# Patient Record
Sex: Female | Born: 1955 | Race: White | Hispanic: No | Marital: Married | Smoking: Former smoker
Health system: Southern US, Community
[De-identification: ages and names within clinical notes are randomized; demographics above are authoritative.]

## PROBLEM LIST (undated history)

## (undated) DIAGNOSIS — M1990 Unspecified osteoarthritis, unspecified site: Secondary | ICD-10-CM

## (undated) DIAGNOSIS — L209 Atopic dermatitis, unspecified: Secondary | ICD-10-CM

## (undated) DIAGNOSIS — U071 COVID-19: Secondary | ICD-10-CM

## (undated) DIAGNOSIS — E785 Hyperlipidemia, unspecified: Secondary | ICD-10-CM

## (undated) DIAGNOSIS — I10 Essential (primary) hypertension: Secondary | ICD-10-CM

## (undated) DIAGNOSIS — F32A Depression, unspecified: Secondary | ICD-10-CM

## (undated) DIAGNOSIS — F419 Anxiety disorder, unspecified: Secondary | ICD-10-CM

## (undated) DIAGNOSIS — E079 Disorder of thyroid, unspecified: Secondary | ICD-10-CM

## (undated) DIAGNOSIS — Z720 Tobacco use: Secondary | ICD-10-CM

## (undated) DIAGNOSIS — F329 Major depressive disorder, single episode, unspecified: Secondary | ICD-10-CM

## (undated) HISTORY — DX: Unspecified osteoarthritis, unspecified site: M19.90

## (undated) HISTORY — DX: Depression, unspecified: F32.A

## (undated) HISTORY — DX: Atopic dermatitis, unspecified: L20.9

## (undated) HISTORY — DX: Tobacco use: Z72.0

## (undated) HISTORY — DX: Essential (primary) hypertension: I10

## (undated) HISTORY — PX: LUMBAR DISC SURGERY: SHX700

## (undated) HISTORY — PX: ROTATOR CUFF REPAIR: SHX139

## (undated) HISTORY — DX: Hyperlipidemia, unspecified: E78.5

## (undated) HISTORY — DX: Major depressive disorder, single episode, unspecified: F32.9

## (undated) HISTORY — DX: Disorder of thyroid, unspecified: E07.9

## (undated) HISTORY — DX: COVID-19: U07.1

## (undated) HISTORY — DX: Anxiety disorder, unspecified: F41.9

---

## 2002-12-18 LAB — HM COLONOSCOPY: HM Colonoscopy: NORMAL

## 2003-11-12 ENCOUNTER — Ambulatory Visit: Payer: Self-pay

## 2005-03-15 ENCOUNTER — Ambulatory Visit: Payer: Self-pay

## 2005-03-28 ENCOUNTER — Ambulatory Visit: Payer: Self-pay

## 2005-04-20 ENCOUNTER — Ambulatory Visit: Payer: Self-pay | Admitting: Otolaryngology

## 2005-09-08 ENCOUNTER — Ambulatory Visit: Payer: Self-pay | Admitting: Internal Medicine

## 2007-01-05 ENCOUNTER — Ambulatory Visit: Payer: Self-pay

## 2007-08-22 ENCOUNTER — Ambulatory Visit: Payer: Self-pay | Admitting: Unknown Physician Specialty

## 2007-10-22 ENCOUNTER — Emergency Department: Payer: Self-pay | Admitting: Emergency Medicine

## 2007-12-17 ENCOUNTER — Ambulatory Visit: Payer: Self-pay | Admitting: Unknown Physician Specialty

## 2008-03-18 ENCOUNTER — Ambulatory Visit: Payer: Self-pay | Admitting: Internal Medicine

## 2008-03-19 ENCOUNTER — Ambulatory Visit: Payer: Self-pay

## 2008-04-07 ENCOUNTER — Ambulatory Visit: Payer: Self-pay | Admitting: Internal Medicine

## 2008-05-09 ENCOUNTER — Emergency Department: Payer: Self-pay | Admitting: Emergency Medicine

## 2008-08-11 ENCOUNTER — Ambulatory Visit: Payer: Self-pay | Admitting: Orthopedic Surgery

## 2008-11-05 ENCOUNTER — Ambulatory Visit: Payer: Self-pay | Admitting: Internal Medicine

## 2008-12-12 ENCOUNTER — Emergency Department: Payer: Self-pay | Admitting: Emergency Medicine

## 2008-12-20 ENCOUNTER — Ambulatory Visit: Payer: Self-pay | Admitting: Internal Medicine

## 2008-12-22 ENCOUNTER — Ambulatory Visit: Payer: Self-pay | Admitting: Internal Medicine

## 2008-12-31 ENCOUNTER — Ambulatory Visit: Payer: Self-pay | Admitting: Internal Medicine

## 2009-02-24 ENCOUNTER — Encounter: Payer: Self-pay | Admitting: Otolaryngology

## 2009-03-03 ENCOUNTER — Encounter: Payer: Self-pay | Admitting: Otolaryngology

## 2009-04-03 ENCOUNTER — Encounter: Payer: Self-pay | Admitting: Otolaryngology

## 2009-05-03 ENCOUNTER — Encounter: Payer: Self-pay | Admitting: Otolaryngology

## 2009-06-09 ENCOUNTER — Ambulatory Visit: Payer: Self-pay

## 2009-07-23 ENCOUNTER — Ambulatory Visit: Payer: Self-pay | Admitting: Internal Medicine

## 2009-08-19 ENCOUNTER — Ambulatory Visit: Payer: Self-pay

## 2009-11-03 ENCOUNTER — Ambulatory Visit: Payer: Self-pay | Admitting: Internal Medicine

## 2009-11-24 ENCOUNTER — Ambulatory Visit: Payer: Self-pay | Admitting: Orthopedic Surgery

## 2010-07-22 ENCOUNTER — Emergency Department: Payer: Self-pay | Admitting: Emergency Medicine

## 2010-07-30 ENCOUNTER — Ambulatory Visit: Payer: Self-pay | Admitting: Otolaryngology

## 2010-08-02 ENCOUNTER — Ambulatory Visit: Payer: Self-pay | Admitting: Otolaryngology

## 2010-08-31 ENCOUNTER — Other Ambulatory Visit: Payer: Self-pay | Admitting: Internal Medicine

## 2010-09-01 MED ORDER — LEVOTHYROXINE SODIUM 88 MCG PO TABS: 88.0000 ug | ORAL_TABLET | Freq: Every day | ORAL | Status: DC

## 2010-09-21 ENCOUNTER — Ambulatory Visit (INDEPENDENT_AMBULATORY_CARE_PROVIDER_SITE_OTHER): Payer: Self-pay | Admitting: Internal Medicine

## 2010-09-21 ENCOUNTER — Ambulatory Visit: Payer: Self-pay | Admitting: Internal Medicine

## 2010-09-21 ENCOUNTER — Encounter: Payer: Self-pay | Admitting: Internal Medicine

## 2010-09-21 DIAGNOSIS — M549 Dorsalgia, unspecified: Secondary | ICD-10-CM

## 2010-09-21 DIAGNOSIS — E039 Hypothyroidism, unspecified: Secondary | ICD-10-CM

## 2010-09-21 DIAGNOSIS — F32A Depression, unspecified: Secondary | ICD-10-CM | POA: Insufficient documentation

## 2010-09-21 DIAGNOSIS — M1990 Unspecified osteoarthritis, unspecified site: Secondary | ICD-10-CM | POA: Insufficient documentation

## 2010-09-21 DIAGNOSIS — K5900 Constipation, unspecified: Secondary | ICD-10-CM

## 2010-09-21 DIAGNOSIS — I10 Essential (primary) hypertension: Secondary | ICD-10-CM | POA: Insufficient documentation

## 2010-09-21 DIAGNOSIS — Z720 Tobacco use: Secondary | ICD-10-CM | POA: Insufficient documentation

## 2010-09-21 DIAGNOSIS — E785 Hyperlipidemia, unspecified: Secondary | ICD-10-CM | POA: Insufficient documentation

## 2010-09-21 DIAGNOSIS — E034 Atrophy of thyroid (acquired): Secondary | ICD-10-CM | POA: Insufficient documentation

## 2010-09-21 DIAGNOSIS — F419 Anxiety disorder, unspecified: Secondary | ICD-10-CM | POA: Insufficient documentation

## 2010-09-21 DIAGNOSIS — L209 Atopic dermatitis, unspecified: Secondary | ICD-10-CM | POA: Insufficient documentation

## 2010-09-21 DIAGNOSIS — F329 Major depressive disorder, single episode, unspecified: Secondary | ICD-10-CM | POA: Insufficient documentation

## 2010-09-21 LAB — POCT URINALYSIS DIPSTICK
Bilirubin, UA: NEGATIVE
Blood, UA: NEGATIVE
Glucose, UA: NEGATIVE
Ketones, UA: NEGATIVE
Nitrite, UA: NEGATIVE
Spec Grav, UA: 1.02
pH, UA: 7

## 2010-09-21 MED ORDER — LACTULOSE 20 GM/30ML PO SOLN: ORAL | Status: DC

## 2010-09-21 NOTE — Progress Notes (Signed)
  Subjective:    Patient ID: [name], female    DOB: 11/18/1955, 55 y.o.   MRN: [id number]  HPI  55 yr old female with history of hypothyroid disorder and recurrent vaginitis presents with mild to moderate  RLQ pain that has been present ofr one to two weeks and recently progressed to involve right flankand back.  Has not had BM in 3 -4 days.  Some nausea, no fevers or vomiting.  Also having recurrent vaginal infections under treatment by GYN but per patient cultures have been repeatedly negative thus far so has been empirically treated with metronidazole gel.  Urine was tested yesterday by GYN during pelvic exam .  Underwent pelvic ultrasound recently which was nonrevealing. Recently started Pamelor for  Insomnia.  Review of Systems  Constitutional: Negative for fever, chills and unexpected weight change.  HENT: Negative for hearing loss, ear pain, nosebleeds, congestion, sore throat, facial swelling, rhinorrhea, sneezing, mouth sores, trouble swallowing, neck pain, neck stiffness, voice change, postnasal drip, sinus pressure, tinnitus and ear discharge.   Eyes: Negative for pain, discharge, redness and visual disturbance.  Respiratory: Negative for cough, chest tightness, shortness of breath, wheezing and stridor.   Cardiovascular: Negative for chest pain, palpitations and leg swelling.  Gastrointestinal: Positive for abdominal pain.  Genitourinary: Positive for vaginal discharge.  Musculoskeletal: Positive for back pain. Negative for myalgias and arthralgias.  Skin: Negative for color change and rash.  Neurological: Negative for dizziness, weakness, light-headedness and headaches.  Hematological: Negative for adenopathy.       Objective:   Physical Exam  Constitutional: She is oriented to person, place, and time. She appears well-developed and well-nourished.  HENT:  Mouth/Throat: Oropharynx is clear and moist.  Eyes: EOM are normal. Pupils are equal, round, and reactive to light. No  scleral icterus.  Neck: Normal range of motion. Neck supple. No JVD present. No thyromegaly present.  Cardiovascular: Normal rate, regular rhythm, normal heart sounds and intact distal pulses.   Pulmonary/Chest: Effort normal and breath sounds normal.  Abdominal: Soft. Bowel sounds are normal. She exhibits no mass. There is no tenderness. There is no rebound.  Musculoskeletal: Normal range of motion. She exhibits no edema.  Lymphadenopathy:    She has no cervical adenopathy.  Neurological: She is alert and oriented to person, place, and time.  Skin: Skin is warm and dry.  Psychiatric: She has a normal mood and affect.          Assessment & Plan:  RLQ pain:  Her exam is benign and is consistent with constipation, since she has had recurrent pelvic and urinary evaluations and has no diarrhea or weight loss.  X rays confirm constipation .  Will treat with lactulose .  If pain persists after resolution of constipation, will obtain further evaluation with CT abd/pelvis to r/o diverticulitisappendicitis.

## 2010-09-22 LAB — COMPREHENSIVE METABOLIC PANEL
AST: 21 U/L (ref 0–37)
Albumin: 4.2 g/dL (ref 3.5–5.2)
BUN: 17 mg/dL (ref 6–23)
CO2: 27 mEq/L (ref 19–32)
Calcium: 9.3 mg/dL (ref 8.4–10.5)
Chloride: 105 mEq/L (ref 96–112)
GFR: 74.14 mL/min (ref >60.00)
Glucose, Bld: 89 mg/dL (ref 70–99)
Potassium: 4.2 mEq/L (ref 3.5–5.1)

## 2010-09-22 LAB — TSH: TSH: 0.86 u[IU]/mL (ref 0.35–5.50)

## 2010-09-23 ENCOUNTER — Telehealth: Payer: Self-pay | Admitting: Internal Medicine

## 2010-09-23 NOTE — Telephone Encounter (Signed)
Informed pt. That her x-ray's of her chest is normal  and KUB is consistent w/constipation

## 2010-09-24 ENCOUNTER — Telehealth: Payer: Self-pay | Admitting: Internal Medicine

## 2010-09-24 NOTE — Telephone Encounter (Signed)
yes

## 2010-09-24 NOTE — Telephone Encounter (Signed)
Patient called and wanted to know if she could come in the office next week for a B12 injection because she is feeling really tired.  She stated she used to get them at [facility].  Please advise.

## 2010-09-27 NOTE — Telephone Encounter (Signed)
Left message advising patient that she can call and schedule nurse visit for b12 injection.

## 2010-09-29 ENCOUNTER — Ambulatory Visit (INDEPENDENT_AMBULATORY_CARE_PROVIDER_SITE_OTHER): Payer: 59 | Admitting: Internal Medicine

## 2010-09-29 DIAGNOSIS — R5381 Other malaise: Secondary | ICD-10-CM

## 2010-09-29 DIAGNOSIS — R5383 Other fatigue: Secondary | ICD-10-CM

## 2010-09-29 MED ORDER — CYANOCOBALAMIN 1000 MCG/ML IJ SOLN
1000.0000 ug | Freq: Once | INTRAMUSCULAR | Status: AC
  Administered 2010-09-29: 1000 ug via INTRAMUSCULAR

## 2010-10-13 ENCOUNTER — Other Ambulatory Visit: Payer: Self-pay | Admitting: Internal Medicine

## 2010-10-14 MED ORDER — BACLOFEN 10 MG PO TABS: 10.0000 mg | ORAL_TABLET | ORAL | Status: DC | PRN

## 2010-11-15 ENCOUNTER — Ambulatory Visit: Payer: Self-pay | Admitting: Obstetrics and Gynecology

## 2010-12-01 LAB — HM PAP SMEAR: HM Pap smear: NORMAL

## 2010-12-01 LAB — HM MAMMOGRAPHY: HM Mammogram: NORMAL

## 2010-12-03 ENCOUNTER — Ambulatory Visit: Payer: Self-pay | Admitting: Unknown Physician Specialty

## 2010-12-10 ENCOUNTER — Telehealth: Payer: Self-pay | Admitting: Internal Medicine

## 2010-12-10 NOTE — Telephone Encounter (Signed)
Ok, you can make her 3 RN appts for the Hepatitis B vaccine , there are 3 shots to be given total over a period of 6 months, at 0, 1 month and 6 month.  [name]

## 2010-12-10 NOTE — Telephone Encounter (Signed)
Line was busy x 2. Will try calling again later.

## 2010-12-10 NOTE — Telephone Encounter (Signed)
Dr.Elliott the GI doctor wants patient to have a hepatitis vaccine for her fatty liver disease.

## 2010-12-14 NOTE — Telephone Encounter (Signed)
Called patient back, she is now asking if she can also get the hep a vaccine.

## 2010-12-14 NOTE — Telephone Encounter (Signed)
YES

## 2010-12-16 NOTE — Telephone Encounter (Signed)
Left message asking patient to return my call.

## 2010-12-29 ENCOUNTER — Other Ambulatory Visit: Payer: Self-pay | Admitting: Internal Medicine

## 2011-01-06 NOTE — Telephone Encounter (Signed)
Left message notifying patient that she can come in for hep a vaccine

## 2011-03-02 ENCOUNTER — Other Ambulatory Visit: Payer: Self-pay | Admitting: Internal Medicine

## 2011-03-02 MED ORDER — BACLOFEN 10 MG PO TABS: 10.0000 mg | ORAL_TABLET | ORAL | Status: DC | PRN

## 2011-03-28 ENCOUNTER — Telehealth: Payer: Self-pay | Admitting: Internal Medicine

## 2011-03-28 NOTE — Telephone Encounter (Signed)
[facility] Triage Call Report Triage Record Num: [id number] Operator: Freddie Breech Patient Name: [name] Call Date & Time: 03/28/2011 11:05:24AM Patient Phone: [phone] PCP: Duncan Dull Patient Gender: Female PCP Fax : [phone] Patient DOB: December 22, 1955 Practice Name: [facility] Reason for Call: Caller: Joseph/Spouse; PCP: Duncan Dull; CB#: [phone]; Call regarding cough x 1 wk, coughing up green mucus, sm amt of blood in her sputum x 1. SOB when coughing. Sleeping now. See in 4 hrs per Cough Protocol. Appts are full. To UC today. Protocol(s) Used: Cough - Adult Recommended Outcome per Protocol: See Provider within 4 hours Reason for Outcome: Blood streaked sputum Care Advice: ~ Use a cool mist humidifier to moisten air. Be sure to clean according to manufacturer's instructions. Call EMS 911 if sudden worsening of breathing problem, dusky or blue/gray color of lips, fingernail beds or skin; chest pain, weakness, or confusion occurs. ~ Most adults need to drink 6-10 eight-ounce glasses (1.2-2.0 liters) of fluids per day unless previously told to limit fluid intake for other medical reasons. Limit fluids that contain caffeine, sugar or alcohol. Urine will be a very light yellow color when you drink enough fluids. ~ 03/28/2011 11:24:46AM Page 1 of 1 [email or web address]_[email or web address]_[email or web address]

## 2011-03-28 NOTE — Telephone Encounter (Signed)
Caller: Joseph/Spouse; PCP: Duncan Dull; CB#: [phone];  Call regarding cough x 1 wk, coughing up green mucus, sm amt of blood in her sputum x 1. SOB when coughing. Sleeping now. See in 4 hrs per Cough Protocol. Appts are full. To UC today.

## 2011-04-05 ENCOUNTER — Telehealth: Payer: Self-pay | Admitting: Internal Medicine

## 2011-04-05 ENCOUNTER — Emergency Department: Payer: Self-pay | Admitting: Emergency Medicine

## 2011-04-05 LAB — URINALYSIS, COMPLETE
Glucose,UR: NEGATIVE mg/dL (ref 0–75)
Hyaline Cast: 4
Ph: 5 (ref 4.5–8.0)
Protein: NEGATIVE
RBC,UR: 1 /HPF (ref 0–5)
Specific Gravity: 1.008 (ref 1.003–1.030)

## 2011-04-05 LAB — COMPREHENSIVE METABOLIC PANEL
Alkaline Phosphatase: 66 U/L (ref 50–136)
Anion Gap: 7 (ref 7–16)
BUN: 12 mg/dL (ref 7–18)
Calcium, Total: 8.9 mg/dL (ref 8.5–10.1)
Chloride: 102 mmol/L (ref 98–107)
Co2: 30 mmol/L (ref 21–32)
EGFR (African American): >60
EGFR (Non-African Amer.): >60
Osmolality: 277 (ref 275–301)
Potassium: 3.7 mmol/L (ref 3.5–5.1)
SGOT(AST): 24 U/L (ref 15–37)
SGPT (ALT): 47 U/L
Sodium: 139 mmol/L (ref 136–145)
Total Protein: 7.3 g/dL (ref 6.4–8.2)

## 2011-04-05 LAB — CBC
HCT: 35.3 % (ref 35.0–47.0)
HGB: 11.8 g/dL — ABNORMAL LOW (ref 12.0–16.0)
MCH: 30.1 pg (ref 26.0–34.0)
MCV: 90 fL (ref 80–100)
RBC: 3.91 10*6/uL (ref 3.80–5.20)
RDW: 14.4 % (ref 11.5–14.5)
WBC: 7.8 10*3/uL (ref 3.6–11.0)

## 2011-04-05 LAB — LIPASE, BLOOD: Lipase: 68 U/L — ABNORMAL LOW (ref 73–393)

## 2011-04-05 NOTE — Telephone Encounter (Signed)
Caller: [name]/Pt; PCP: Duncan Dull; CB#: [phone]; Call regarding Back Pain;  Lower back pain with some pain R abdomen.  Onset: 04/01/11.  Afebrile. Menopausal.   Hx of constipation. Had diarrhea 04/04/11 after use of Lactulose.  No BM 04/05/11.  Treated 03/28/11 for bronchitis: remains on Clarithomycin; was in bed X 10 days.  Used Hydrocodone 5/325 1 po q 4-6 hrs over weekend and today; speech mildy slurred.  R foot edematous. May be retaining water in abdomen. Feels like pain is now chronic; not acute.  Reports severe itching of skin after taking BID Clarithromycin. Using heat and ice on back with some relief. No appt remain in office. Advised to see ED now for new onset of unexplained bladder control per Back Symptoms Guideline and new onset of joint pain, and muscle aches less than 10 days after starting new RX per Severe  Allergic Reaction Guideline.

## 2011-04-06 ENCOUNTER — Telehealth: Payer: Self-pay | Admitting: *Deleted

## 2011-04-06 NOTE — Telephone Encounter (Signed)
Tried calling patient x 3, but her voice mail keeps coming on first ring. I have left message asking her to call back.

## 2011-04-06 NOTE — Telephone Encounter (Signed)
Please give this patient an appt but do not overbook because she requires 30 minutes due to mutliple emotional and social issues

## 2011-04-06 NOTE — Telephone Encounter (Signed)
Triage Record Num: [id number] Operator: Geanie Berlin Patient Name: [name] Call Date & Time: 04/05/2011 3:06:21PM Patient Phone: [phone] PCP: Duncan Dull Patient Gender: Female PCP Fax : [phone] Patient DOB: [birth year] Practice Name: [facility] Reason for Call: Caller: Kyoko/Pt; PCP: Duncan Dull; CB#: [phone]; Call regarding Back Pain; Lower back pain with some pain R abdomen. Onset: 04/01/11. Afebrile. Menopausal. Hx of constipation. Had diarrhea 04/04/11 after use of Lactulose. No BM 04/05/11. Treated 03/28/11 for bronchitis; remains on Clarithomycin; was in bed X 10 days.. Used Hydrocodone 5/325 1 po q 4-6 hrs over weekend and today; speech mildy slurred. R foot edematous. May be retaining water in abdomen. Feels like pain is now chronic; not acute. Reports severe itching of skin after taking BID Clarithromycin. Using heat and ice on back with some relief. No appt remain in office. Advised to see ED now for new onset of unexplained bladder control per Back Symptoms Guideline and new onset of joint pain, and muscle aches less than 10 days after starting new RX per Severe Allergic Reaction Guideline. Protocol(s) Used: Allergic Reaction, Severe Recommended Outcome per Protocol: Call Provider within 4 Hours Override Outcome if Used in Protocol: See ED Immediately RN Reason for Override Outcome: Nursing Judgement Used. Reason for Outcome: New onset rash, joint pain, muscle aches, swollen glands or any temperature elevation without known cause AND less than 10 days after starting new medication(s) Care Advice: ~ Speak with provider before next dose of medication is due. ~ SYMPTOM / CONDITION MANAGEMENT ~ List, or take, all current prescription(s), nonprescription or alternative medication(s) to provider for evaluation. If an allergy is identified, tell all healthcare providers of your allergy. Even if a first-time reaction caused mild symptoms, a future  response to the same allergen may cause more serious symptoms. Wear medical identification to alert others in case of an emergency. ~ Medication Advice: - Discontinue all nonprescription and alternative medications, especially stimulants, until evaluated by provider. - Take prescribed medications as directed, following label instructions for the medication. - Do not change medications or dosing regimen until provider is consulted. - Know possible side effects of medication and what to do if they occur. - Tell provider all prescription, nonprescription or alternative medications that you take ~ Call EMS 911 if develop signs and symptoms of anaphylaxis within minutes to several hours of exposure: severe difficulty breathing; rapid, weak or irregular pulse; pruritus, urticaria, swelling of face, lips, tongue, or throat causing tightness or difficulty swallowing; abdominal cramping, nausea, vomiting or diarrhea. ~ 04/05/2011 4:09:59PM Page 1 of 1 [email or web address]_[email or web address]_V2 [facility] Triage Call Report Triage Record

## 2011-04-07 NOTE — Telephone Encounter (Signed)
Spoke with patient and she says that she already has an appt for MOnday.

## 2011-04-08 ENCOUNTER — Telehealth: Payer: Self-pay | Admitting: Internal Medicine

## 2011-04-08 ENCOUNTER — Emergency Department: Payer: Self-pay | Admitting: *Deleted

## 2011-04-08 LAB — COMPREHENSIVE METABOLIC PANEL
Alkaline Phosphatase: 68 U/L (ref 50–136)
Bilirubin,Total: 0.3 mg/dL (ref 0.2–1.0)
Calcium, Total: 9 mg/dL (ref 8.5–10.1)
Chloride: 107 mmol/L (ref 98–107)
Co2: 25 mmol/L (ref 21–32)
Creatinine: 0.82 mg/dL (ref 0.60–1.30)
EGFR (African American): >60
Glucose: 126 mg/dL — ABNORMAL HIGH (ref 65–99)
SGPT (ALT): 66 U/L
Sodium: 141 mmol/L (ref 136–145)
Total Protein: 7.3 g/dL (ref 6.4–8.2)

## 2011-04-08 LAB — URINALYSIS, COMPLETE
Bilirubin,UR: NEGATIVE
Glucose,UR: NEGATIVE mg/dL (ref 0–75)
Nitrite: NEGATIVE
Ph: 7 (ref 4.5–8.0)
Protein: NEGATIVE
Specific Gravity: 1.005 (ref 1.003–1.030)
Squamous Epithelial: 2
WBC UR: 3 /HPF (ref 0–5)

## 2011-04-08 LAB — CBC
HCT: 36.9 % (ref 35.0–47.0)
MCHC: 33.3 g/dL (ref 32.0–36.0)
Platelet: 294 10*3/uL (ref 150–440)
RBC: 4.11 10*6/uL (ref 3.80–5.20)
RDW: 14.1 % (ref 11.5–14.5)
WBC: 13.6 10*3/uL — ABNORMAL HIGH (ref 3.6–11.0)

## 2011-04-08 NOTE — Telephone Encounter (Signed)
Caller: Suni/Patient; PCP: Duncan Dull; CB#: [phone]; ; ; Call regarding Back Pain;  onset "weeks ago."  States has been seen in UC, ED most recently 04/05/11.  States has appt 04/11/11 with Dr. Darrick Huntsman.  States she believes it is related to "an intestinal problem."  c/o R sided low back pain.  Has history of constipation, but had large BM 04/08/11 AM.  Pain intensified after having BM.  Was told she has a UTI.  CT showed "inflammation of the back."   History of spinal fusion and laminectomy.  States sometimes the back pain goes down the back of the thigh.  Takiing lactulose.  States taking antibiotic for urinary tract infection/levofloxacin since 04/05/11.  Temp 101.3.  Per protocol, disposition to call provider immediately; patient states has been seen elsewhere due to lack of appts, and prefers to be seen in office by Dr. Darrick Huntsman.  TC to office; unable to accommodate appt this afternoon.  Patient advised to go back to ED where treated for UTI; to keep appt Monday 04/11/11 with Dr. Darrick Huntsman in follow up.

## 2011-04-08 NOTE — Telephone Encounter (Signed)
[facility] Triage Call Report Triage Record Num: [id number] Operator: Chevis Pretty Patient Name: [name] Call Date & Time: 04/08/2011 1:51:11PM Patient Phone: [phone] PCP: Duncan Dull Patient Gender: Female PCP Fax : [phone] Patient DOB: [birth year] Practice Name: [facility] Reason for Call: Caller: Orrie/Patient; PCP: Duncan Dull; CB#: [phone]; ; ; Call regarding Back Pain; onset "weeks ago." States has been seen in UC, ED most recently 04/05/11. States has appt 04/11/11 with Dr. Darrick Huntsman. States she believes it is related to "an intestinal problem." c/o R sided low back pain. Has history of constipation, but had large BM 04/08/11 AM. Pain intensified after having BM. Was told she has a UTI. CT showed "inflammation of the back." History of spinal fusion and laminectomy. States sometimes the back pain goes down the back of the thigh. Takiing lactulose. States taking antibiotic for urinary tract infection/levofloxacin since 04/05/11. Temp 101.3. Per protocol, disposition to call provider immediately; patient states has been seen elsewhere due to lack of appts, and prefers to be seen in office by Dr. Darrick Huntsman. TC to office; unable to accommodate appt this afternoon. Patient advised to go back to ED where treated for UTI; to keep appt Monday 04/11/11 with Dr. Darrick Huntsman in follow up. Protocol(s) Used: Back Symptoms Recommended Outcome per Protocol: Call Provider Immediately Reason for Outcome: Fever of 100.5 F (38.1 C) or higher associated with back symptoms Care Advice: ~ 04/08/2011 2:18:22PM Page 1 of 1 CAN_TriageRpt_V2

## 2011-04-11 ENCOUNTER — Encounter: Payer: Self-pay | Admitting: Internal Medicine

## 2011-04-11 ENCOUNTER — Ambulatory Visit (INDEPENDENT_AMBULATORY_CARE_PROVIDER_SITE_OTHER): Payer: 59 | Admitting: Internal Medicine

## 2011-04-11 VITALS — BP 132/86 | HR 102 | Temp 98.3°F | Resp 18 | Wt 247.2 lb

## 2011-04-11 DIAGNOSIS — E039 Hypothyroidism, unspecified: Secondary | ICD-10-CM

## 2011-04-11 DIAGNOSIS — E559 Vitamin D deficiency, unspecified: Secondary | ICD-10-CM

## 2011-04-11 DIAGNOSIS — E66813 Obesity, class 3: Secondary | ICD-10-CM

## 2011-04-11 DIAGNOSIS — Z23 Encounter for immunization: Secondary | ICD-10-CM

## 2011-04-11 DIAGNOSIS — K7689 Other specified diseases of liver: Secondary | ICD-10-CM

## 2011-04-11 DIAGNOSIS — I10 Essential (primary) hypertension: Secondary | ICD-10-CM

## 2011-04-11 DIAGNOSIS — E538 Deficiency of other specified B group vitamins: Secondary | ICD-10-CM

## 2011-04-11 DIAGNOSIS — K760 Fatty (change of) liver, not elsewhere classified: Secondary | ICD-10-CM

## 2011-04-11 MED ORDER — DIAZEPAM 5 MG PO TABS: 5.0000 mg | ORAL_TABLET | Freq: Three times a day (TID) | ORAL | Status: AC | PRN

## 2011-04-11 MED ORDER — CYANOCOBALAMIN 1000 MCG/ML IJ SOLN: INTRAMUSCULAR | Status: DC

## 2011-04-11 MED ORDER — CYANOCOBALAMIN 1000 MCG/ML IJ SOLN
1000.0000 ug | Freq: Once | INTRAMUSCULAR | Status: AC
  Administered 2011-04-11: 1000 ug via INTRAMUSCULAR

## 2011-04-11 MED ORDER — OXYCODONE-ACETAMINOPHEN 5-325 MG PO TABS: 1.0000 | ORAL_TABLET | Freq: Four times a day (QID) | ORAL | Status: AC | PRN

## 2011-04-11 NOTE — Progress Notes (Signed)
Patient ID: [name], female   DOB: 1955/09/13, 56 y.o.   MRN: [id number]  Patient Active Problem List  Diagnoses  . Tobacco abuse  . Depression  . Thyroid disease  . Hyperlipidemia  . Hypertension  . Arthritis  . Atopic dermatitis  . Anxiety  . B12 deficiency  . Fatty liver disease, nonalcoholic  . Obesity, Class III, BMI 40-49.9 (morbid obesity)    Subjective:  CC:   Chief Complaint  Patient presents with  . Back Pain    HPI:   [name] a 56 y.o. female who presents  Follow up on multiple chronic medical issues. Recently treated in ER  For UTI, bronchitis. And back pain,  Rib pain,  Abdominal pain, ,  Long list of symptoms , now all resolved except back pain.  She had an abnormal CXR with nodule and infiltrate noted,  treated with levaquin 750 mg daily for 7 days. Was treated with morphine,  toradol and diazepam for severe back pain,  Lumbar films were nonrevealing and she was prescribed #5 vicodin, which she took 2 at a time for relief of low back pain .[name]  Seeing her spine surgeon , Dr [facility] tomorrow.     Past Medical History  Diagnosis Date  . Tobacco abuse   . Depression   . Thyroid disease   . Hyperlipidemia   . Hypertension   . Arthritis   . Atopic dermatitis     scalp  . Anxiety     Past Surgical History  Procedure Date  . Rotator cuff repair   . Lumbar disc surgery        . cyanocobalamin  1,000 mcg Intramuscular Once     The following portions of the patient's history were reviewed and updated as appropriate: Allergies, current medications, and problem list.    Review of Systems:   12 Pt  review of systems was negative except those addressed in the HPI,     History   Social History  . Marital Status: Married    Spouse Name: N/A    Number of Children: N/A  . Years of Education: N/A   Occupational History  . Not on file.   Social History Main Topics  . Smoking status: Former Smoker    Quit date:  03/21/2010  . Smokeless tobacco: Never Used  . Alcohol Use: Yes     occasional  . Drug Use: No  . Sexually Active: Not on file   Other Topics Concern  . Not on file   Social History Narrative  . No narrative on file    Objective:  BP 132/86  Pulse 102  Temp(Src) 98.3 F (36.8 C) (Oral)  Resp 18  Wt 247 lb 4 oz (112.152 kg)  SpO2 97%  General appearance: alert, cooperative and appears stated age Ears: normal TM's and external ear canals both ears Throat: lips, mucosa, and tongue normal; teeth and gums normal Neck: no adenopathy, no carotid bruit, supple, symmetrical, trachea midline and thyroid not enlarged, symmetric, no tenderness/mass/nodules Back: symmetric, no curvature. ROM normal. No CVA tenderness. Lungs: clear to auscultation bilaterally Heart: regular rate and rhythm, S1, S2 normal, no murmur, click, rub or gallop Abdomen: soft, non-tender; bowel sounds normal; no masses,  no organomegaly Pulses: 2+ and symmetric Skin: Skin color, texture, turgor normal. No rashes or lesions Lymph nodes: Cervical, supraclavicular, and axillary nodes normal.  Assessment and Plan:  Fatty liver disease, nonalcoholic Recommended by Tyler Aas to get hepatitis  vaccine series,  First given today   B12 deficiency IM dose given today,  Daughter to give subsequent does as she is a RN>   Hypertension Well controlled currently.  No changes  Obesity, Class III, BMI 40-49.9 (morbid obesity) Aggravated by psychotropic meds and back pain . I have addressed  BMI and recommended a low glycemic index diet utilizing smaller more frequent meals to increase metabolism.  I have also recommended that patient start exercising with a goal of 30 minutes of aerobic exercise a minimum of 5 days per week. Screening for lipid disorders, thyroid and diabetes to be done today.       Updated Medication List Outpatient Encounter Prescriptions as of 04/11/2011  Medication Sig Dispense Refill  .  albuterol (PROVENTIL HFA;VENTOLIN HFA) 108 (90 BASE) MCG/ACT inhaler Inhale 2 puffs into the lungs every 6 (six) hours as needed.      . baclofen (LIORESAL) 10 MG tablet Take 1 tablet (10 mg total) by mouth as needed.  30 each  3  . clonazePAM (KLONOPIN) 1 MG tablet Take 1 mg by mouth 3 (three) times daily as needed.        . Diclofenac Sodium 3 % GEL Place onto the skin. Diclofenac,baclofen, cyclobenzaprine, lidocaine      . Escitalopram Oxalate (LEXAPRO PO) Take 30 mg by mouth daily.       . furosemide (LASIX) 20 MG tablet Take 20 mg by mouth 2 (two) times daily.      [name] levothyroxine (SYNTHROID, LEVOTHROID) 88 MCG tablet TAKE 1 TABLET (88 MCG TOTAL) BY MOUTH DAILY.  30 tablet  3  . Nebivolol HCl (BYSTOLIC PO) Take 2.5 mg by mouth daily.       . nortriptyline (PAMELOR) 10 MG capsule Take 10 mg by mouth at bedtime. Take one capsule at bedtime for 10 days, then take two capsules at bedtime       . oxyCODONE-acetaminophen (PERCOCET) 5-325 MG per tablet Take 1 tablet by mouth every 4 (four) hours as needed.      . cyanocobalamin (,VITAMIN B-12,) 1000 MCG/ML injection Inject 1 ml into muscle monthly  10 mL  1  . diazepam (VALIUM) 5 MG tablet Take 1 tablet (5 mg total) by mouth every 8 (eight) hours as needed (muscle spasm).  30 tablet  1  . oxyCODONE-acetaminophen (ROXICET) 5-325 MG per tablet Take 1 tablet by mouth every 6 (six) hours as needed for pain.  60 tablet  0  . DISCONTD: Lactulose 20 GM/30ML SOLN Take 30 ml by mouth every 6 hours until constipation is relieved.  240 mL  1   Facility-Administered Encounter Medications as of 04/11/2011  Medication Dose Route Frequency Provider Last Rate Last Dose  . cyanocobalamin ((VITAMIN B-12)) injection 1,000 mcg  1,000 mcg Intramuscular Once Sherlene Shams, MD   1,000 mcg at 04/11/11 1043     Orders Placed This Encounter  Procedures  . Hepatitis A hepatitis B combined vaccine IM  . Vitamin D 25 hydroxy  . Lipid panel  . TSH    Return in about 1  day (around 04/12/2011).

## 2011-04-11 NOTE — Assessment & Plan Note (Signed)
Well controlled currently. No changes. 

## 2011-04-11 NOTE — Assessment & Plan Note (Signed)
Recommended by Tyler Aas to get hepatitis vaccine series,  First given today

## 2011-04-11 NOTE — Assessment & Plan Note (Signed)
IM dose given today,  Daughter to give subsequent does as she is a RN>

## 2011-04-11 NOTE — Assessment & Plan Note (Signed)
Aggravated by psychotropic meds and back pain . I have addressed  BMI and recommended a low glycemic index diet utilizing smaller more frequent meals to increase metabolism.  I have also recommended that patient start exercising with a goal of 30 minutes of aerobic exercise a minimum of 5 days per week. Screening for lipid disorders, thyroid and diabetes to be done today.

## 2011-04-11 NOTE — Patient Instructions (Addendum)
You can take miralax daily,. but you need to take Dulcolax, Sennakot S or Ex Lax (stimulant laxatives)  While you are taking narcotics.    I am prescribing percocet so you willl use less tylenol on a daily basis.   Consider the Low Glycemic Index Diet and 6 smaller meals daily :   7 AM Low carbohydrate Protein  Shakes (EAS Carb Control  Or Atkins ,  Available everywhere,   In  cases at [employer] )  2.5 carbs  (Add or substitute a toasted sandwhich thin w/ peanut butter)  10 AM: Protein bar by Atkins (snack size,  Chocolate lover's variety at  [employer])    Lunch: sandwich on pita bread or flatbread (Joseph's makes a pita bread and a flat bread , available at [employer] and [employer]; Toufayah makes a low carb flatbread available at [employer] and [employer]) Also try the low carb whole wheat tortillas by [employer]   3 PM:  Mid day :  Another protein bar,  Or a  cheese stick, 1/4 cup of almonds, walnuts, pistachios, pecans, peanuts,  Macadamia nuts  6 PM  Dinner:  "mean and green:"  Meat/chicken/fish, salad, and green veggie : use ranch, vinagrette,  Blue cheese, etc  9 PM snack : Breyer's low carb fudgiscle or  ice cream bar (Carb Smart) Weight Watcher's ice cream bar , or another protein shake   If you make substitutions,  Choose foods that are < 20 carbs per serving (greek yogurt)

## 2011-05-02 ENCOUNTER — Telehealth: Payer: Self-pay | Admitting: Internal Medicine

## 2011-05-02 NOTE — Telephone Encounter (Signed)
Caller: Ivanna/Patient; PCP: Duncan Dull; CB#: [phone]; ; ; Call regarding Dizziness; Onset 04/30/11.  States she has already fallen this am and feels that if she stands she will fall. "Feels like the room is moving , but the room is not moving."  Advised see in 4 hours  per Dizziness or Vertigo protocol.  No appts available in Epic.   Morrie Sheldon contacted; advised Urgent Care. Home care for the interim for callback given.  Caller informed and states plan to go to [facility] UC.

## 2011-05-06 ENCOUNTER — Other Ambulatory Visit: Payer: Self-pay | Admitting: Internal Medicine

## 2011-05-11 ENCOUNTER — Telehealth: Payer: Self-pay | Admitting: *Deleted

## 2011-05-11 ENCOUNTER — Ambulatory Visit (INDEPENDENT_AMBULATORY_CARE_PROVIDER_SITE_OTHER): Payer: 59 | Admitting: *Deleted

## 2011-05-11 DIAGNOSIS — Z23 Encounter for immunization: Secondary | ICD-10-CM

## 2011-05-11 NOTE — Telephone Encounter (Signed)
I may, but not over the phone. Please ask her to make appt so we can discuss.

## 2011-05-11 NOTE — Telephone Encounter (Signed)
Pt has been trying to lose weight for a while, she is wondering you you could prescribe an appetite suppressant for her. She is working hard at it but having trouble with hunger.

## 2011-05-12 NOTE — Telephone Encounter (Signed)
Left message asking patient to return my call.

## 2011-05-17 NOTE — Telephone Encounter (Signed)
Left detailed message asking patient to call the office to make an appt with Dr. Darrick Huntsman

## 2011-05-19 ENCOUNTER — Encounter: Payer: Self-pay | Admitting: Otolaryngology

## 2011-06-02 ENCOUNTER — Other Ambulatory Visit: Payer: 59

## 2011-06-02 ENCOUNTER — Ambulatory Visit: Payer: 59 | Admitting: Internal Medicine

## 2011-06-03 ENCOUNTER — Other Ambulatory Visit: Payer: Self-pay | Admitting: *Deleted

## 2011-06-03 MED ORDER — FUROSEMIDE 20 MG PO TABS: 20.0000 mg | ORAL_TABLET | Freq: Two times a day (BID) | ORAL | Status: DC

## 2011-06-04 ENCOUNTER — Encounter: Payer: Self-pay | Admitting: Otolaryngology

## 2011-06-16 ENCOUNTER — Encounter: Payer: Self-pay | Admitting: Internal Medicine

## 2011-06-16 ENCOUNTER — Other Ambulatory Visit: Payer: 59

## 2011-06-16 ENCOUNTER — Ambulatory Visit (INDEPENDENT_AMBULATORY_CARE_PROVIDER_SITE_OTHER): Payer: 59 | Admitting: Internal Medicine

## 2011-06-16 VITALS — BP 114/80 | HR 106 | Temp 98.0°F | Resp 18 | Ht 65.0 in | Wt 246.0 lb

## 2011-06-16 DIAGNOSIS — E559 Vitamin D deficiency, unspecified: Secondary | ICD-10-CM

## 2011-06-16 DIAGNOSIS — K760 Fatty (change of) liver, not elsewhere classified: Secondary | ICD-10-CM

## 2011-06-16 DIAGNOSIS — E079 Disorder of thyroid, unspecified: Secondary | ICD-10-CM

## 2011-06-16 DIAGNOSIS — E039 Hypothyroidism, unspecified: Secondary | ICD-10-CM

## 2011-06-16 DIAGNOSIS — K7689 Other specified diseases of liver: Secondary | ICD-10-CM

## 2011-06-16 DIAGNOSIS — Z79899 Other long term (current) drug therapy: Secondary | ICD-10-CM

## 2011-06-16 LAB — TSH: TSH: 1.57 u[IU]/mL (ref 0.35–5.50)

## 2011-06-16 LAB — LDL CHOLESTEROL, DIRECT: Direct LDL: 145.4 mg/dL

## 2011-06-16 MED ORDER — PHENTERMINE HCL 37.5 MG PO TABS: 18.5000 mg | ORAL_TABLET | Freq: Two times a day (BID) | ORAL | Status: DC

## 2011-06-16 NOTE — Progress Notes (Signed)
Patient ID: [name], female   DOB: 11/27/1955, 56 y.o.   MRN: [id number]  Patient Active Problem List  Diagnosis  . Tobacco abuse  . Depression  . Thyroid disease  . Hyperlipidemia  . Hypertension  . Arthritis  . Atopic dermatitis  . Anxiety  . B12 deficiency  . Fatty liver disease, nonalcoholic  . Obesity, Class III, BMI 40-49.9 (morbid obesity)    Subjective:  CC:   Chief Complaint  Patient presents with  . Wants to lose weight    HPI:   [name] a 56 y.o. female who presents with uncontrolled weight gain.  She has gained 15 lb since September 2012.  Her appetite is increased .  She has not been exercising because of her loss balance, bilateral foot pain, and failing eyesight.  She is receiving PT .  She is willing to use her stationery bike at home. She denies constipation, myalgias, and hair loss. She  Is requesting temporary prescription of appetite suppressants.  She has used phentermine in the remote past without side effects.    Past Medical History  Diagnosis Date  . Tobacco abuse   . Depression   . Thyroid disease   . Hyperlipidemia   . Hypertension   . Arthritis   . Atopic dermatitis     scalp  . Anxiety     Past Surgical History  Procedure Date  . Rotator cuff repair   . Lumbar disc surgery          The following portions of the patient's history were reviewed and updated as appropriate: Allergies, current medications, and problem list.    Review of Systems:   12 Pt  review of systems was negative except those addressed in the HPI,     History   Social History  . Marital Status: Married    Spouse Name: N/A    Number of Children: N/A  . Years of Education: N/A   Occupational History  . Not on file.   Social History Main Topics  . Smoking status: Former Smoker    Quit date: 03/21/2010  . Smokeless tobacco: Never Used  . Alcohol Use: Yes     occasional  . Drug Use: No  . Sexually Active: Not on file   Other Topics  Concern  . Not on file   Social History Narrative  . No narrative on file    Objective:  BP 114/80  Pulse 106  Temp 98 F (36.7 C) (Oral)  Resp 18  Ht 5\' 5"  (1.651 m)  Wt 246 lb (111.585 kg)  BMI 40.94 kg/m2  SpO2 96%  General appearance: obese, alert, cooperative and appears stated age Ears: normal TM's and external ear canals both ears Throat: lips, mucosa, and tongue normal; teeth and gums normal Neck: no adenopathy, no carotid bruit, supple, symmetrical, trachea midline and thyroid not enlarged, symmetric, no tenderness/mass/nodules Back: symmetric, no curvature. ROM normal. No CVA tenderness. Lungs: clear to auscultation bilaterally Heart: regular rate and rhythm, S1, S2 normal, no murmur, click, rub or gallop Abdomen: soft, non-tender; bowel sounds normal; no masses,  no organomegaly Pulses: 2+ and symmetric Skin: Skin color, texture, turgor normal. No rashes or lesions Lymph nodes: Cervical, supraclavicular, and axillary nodes normal.  Assessment and Plan:   Obesity, Class III, BMI 40-49.9 (morbid obesity) I have addressed  BMI and recommended a low glycemic index diet utilizing smaller more frequent meals to increase metabolism.  I have also recommended that patient start exercising with  a goal of 30 minutes of aerobic exercise a minimum of 5 days per week. Risks and benefits of phentermine use discussed.  We will see her again in 30 days.     Thyroid disease She is due for TSH to follow supplementation adequacy.   Fatty liver disease, nonalcoholic To be managed with low glycemic index diet,  Weight loss and medications if lipids indicated.    Updated Medication List Outpatient Encounter Prescriptions as of 06/16/2011  Medication Sig Dispense Refill  . albuterol (PROVENTIL HFA;VENTOLIN HFA) 108 (90 BASE) MCG/ACT inhaler Inhale 2 puffs into the lungs every 6 (six) hours as needed.      . baclofen (LIORESAL) 10 MG tablet Take 1 tablet (10 mg total) by mouth as  needed.  30 each  3  . clonazePAM (KLONOPIN) 1 MG tablet Take 1 mg by mouth 3 (three) times daily as needed.        . cyanocobalamin (,VITAMIN B-12,) 1000 MCG/ML injection Inject 1 ml into muscle monthly  10 mL  1  . Diclofenac Sodium 3 % GEL Place onto the skin. Diclofenac,baclofen, cyclobenzaprine, lidocaine      . Escitalopram Oxalate (LEXAPRO PO) Take 30 mg by mouth daily.       . furosemide (LASIX) 20 MG tablet Take 1 tablet (20 mg total) by mouth 2 (two) times daily.  30 tablet  6  . levothyroxine (SYNTHROID, LEVOTHROID) 88 MCG tablet TAKE 1 TABLET (88 MCG TOTAL) BY MOUTH DAILY.  30 tablet  3  . Nebivolol HCl (BYSTOLIC PO) Take 2.5 mg by mouth daily.       . nortriptyline (PAMELOR) 10 MG capsule Take 10 mg by mouth at bedtime. Take one capsule at bedtime for 10 days, then take two capsules at bedtime       . oxyCODONE-acetaminophen (PERCOCET) 5-325 MG per tablet Take 1 tablet by mouth every 4 (four) hours as needed.      . Trifluoroacetic Acid LIQD by Does not apply route.      . phentermine (ADIPEX-P) 37.5 MG tablet Take 0.5 tablets (18.75 mg total) by mouth 2 (two) times daily before a meal.  30 tablet  1

## 2011-06-16 NOTE — Patient Instructions (Addendum)
Consider the Low Glycemic Index Diet and 6 smaller meals daily .  This boosts your metabolism and regulates your sugars:   7 AM Low carbohydrate Protein  Shakes (EAS Carb Control  Or Atkins ,  Available everywhere,   In  cases at [employer]Js )  2.5 carbs  (Add or substitute a toasted sandwhich thin w/ peanut butter)  10 AM: Protein bar by Atkins (snack size,  Chocolate lover's variety at  [employer])    Lunch: sandwich on pita bread or flatbread (Joseph's makes a pita bread and a flat bread , available at [employer] and [employer]; Toufayah makes a low carb flatbread available at [employer] and [employer]) Mission makes a low carb whole wheat tortilla available at [employer] most grocery stores   3 PM:  Mid day :  Another protein bar,  Or a  cheese stick, 1/4 cup of almonds, walnuts, pistachios, pecans, peanuts,  Macadamia nuts  6 PM  Dinner:  "mean and green:"  Meat/chicken/fish, salad, and green veggie : use ranch, vinagrette,  Blue cheese, etc  9 PM snack : Breyer's low carb fudgsicle or  ice cream bar (Carb Smart), or  Weight Watcher's ice cream bar , or another protein shake  Make sure any substitutions  are < 15 g per serving (Dannon makes a Light and fit greek yogurt that is 8 carbs./ 80 calories )

## 2011-06-17 LAB — COMPLETE METABOLIC PANEL WITH GFR
Albumin: 4.2 g/dL (ref 3.5–5.2)
CO2: 25 mEq/L (ref 19–32)
Calcium: 9.7 mg/dL (ref 8.4–10.5)
Chloride: 104 mEq/L (ref 96–112)
GFR, Est African American: 83 mL/min
GFR, Est Non African American: 72 mL/min
Glucose, Bld: 115 mg/dL — ABNORMAL HIGH (ref 70–99)
Sodium: 142 mEq/L (ref 135–145)
Total Bilirubin: 0.4 mg/dL (ref 0.3–1.2)
Total Protein: 6.5 g/dL (ref 6.0–8.3)

## 2011-06-19 ENCOUNTER — Encounter: Payer: Self-pay | Admitting: Internal Medicine

## 2011-06-19 NOTE — Assessment & Plan Note (Signed)
To be managed with low glycemic index diet,  Weight loss and medications if lipids indicated.

## 2011-06-19 NOTE — Assessment & Plan Note (Signed)
I have addressed  BMI and recommended a low glycemic index diet utilizing smaller more frequent meals to increase metabolism.  I have also recommended that patient start exercising with a goal of 30 minutes of aerobic exercise a minimum of 5 days per week. Risks and benefits of phentermine use discussed.  We will see her again in 30 days.

## 2011-06-19 NOTE — Assessment & Plan Note (Signed)
She is due for TSH to follow supplementation adequacy.

## 2011-06-28 ENCOUNTER — Ambulatory Visit: Payer: Self-pay | Admitting: Otolaryngology

## 2011-07-04 ENCOUNTER — Encounter: Payer: Self-pay | Admitting: Otolaryngology

## 2011-07-08 ENCOUNTER — Other Ambulatory Visit: Payer: Self-pay | Admitting: *Deleted

## 2011-07-09 MED ORDER — BACLOFEN 10 MG PO TABS: 10.0000 mg | ORAL_TABLET | ORAL | Status: DC | PRN

## 2011-07-13 ENCOUNTER — Ambulatory Visit: Payer: Self-pay | Admitting: Otolaryngology

## 2011-07-20 ENCOUNTER — Ambulatory Visit: Payer: 59 | Admitting: Internal Medicine

## 2011-07-22 ENCOUNTER — Ambulatory Visit (INDEPENDENT_AMBULATORY_CARE_PROVIDER_SITE_OTHER): Payer: 59 | Admitting: Internal Medicine

## 2011-07-22 ENCOUNTER — Encounter: Payer: Self-pay | Admitting: Internal Medicine

## 2011-07-22 VITALS — BP 122/82 | HR 104 | Temp 98.7°F | Ht 60.0 in | Wt 242.0 lb

## 2011-07-22 DIAGNOSIS — E039 Hypothyroidism, unspecified: Secondary | ICD-10-CM

## 2011-07-22 DIAGNOSIS — E785 Hyperlipidemia, unspecified: Secondary | ICD-10-CM

## 2011-07-22 DIAGNOSIS — R7309 Other abnormal glucose: Secondary | ICD-10-CM

## 2011-07-22 DIAGNOSIS — E669 Obesity, unspecified: Secondary | ICD-10-CM | POA: Insufficient documentation

## 2011-07-22 DIAGNOSIS — I10 Essential (primary) hypertension: Secondary | ICD-10-CM

## 2011-07-22 DIAGNOSIS — E66813 Obesity, class 3: Secondary | ICD-10-CM

## 2011-07-22 DIAGNOSIS — R739 Hyperglycemia, unspecified: Secondary | ICD-10-CM

## 2011-07-22 NOTE — Progress Notes (Signed)
Patient ID: [name], female   DOB: Jul 17, 1955, 56 y.o.   MRN: [id number]  Patient Active Problem List  Diagnosis  . Tobacco abuse  . Depression  . Hypothyroidism  . Hyperlipidemia  . Hypertension  . Arthritis  . Atopic dermatitis  . Anxiety  . B12 deficiency  . Fatty liver disease, nonalcoholic  . Obesity, Class III, BMI 40-49.9 (morbid obesity)    Subjective:  CC:   Chief Complaint  Patient presents with  . Follow-up    HPI:   [name] a 56 y.o. female who presents for followup on chronic issues including fatigue obesity and arthralgias. She has had a sleep study which was positive for apnea,  Ordered by Ju[facility]el.    Had a hot flash during study which wreaked havoc on the brain electrodes.  The CPAP titration study done last week  was unsuccessful due to lack of sedation and therefore sleep. . Had inadvertently stopped her bystolic and was having tachycardia without it. Taking PT rehab for balance and her balance has imporved remarkably .  She has been trying to lose weight with caloric restriction and use of appetite suppressant. She's also tried exercise but has multiple joint aches, which prevent her from engaging fully in a consist aerobic exercise plan.  Her right hip started hurting yesterday from iliac crest to knee. Her right knee is hurting,  And her feet arevswelling .  She is taking the phentermine intermittently not daily.  Riding a stationery bike, which bothered her foot.      Past Medical History  Diagnosis Date  . Tobacco abuse   . Depression   . Thyroid disease   . Hyperlipidemia   . Hypertension   . Arthritis   . Atopic dermatitis     scalp  . Anxiety     Past Surgical History  Procedure Date  . Rotator cuff repair   . Lumbar disc surgery          The following portions of the patient's history were reviewed and updated as appropriate: Allergies, current medications, and problem list.    Review of Systems:   Review of Systems    Constitutional: Positive for weight loss and malaise/fatigue.  HENT: Negative.   Eyes: Negative.   Respiratory: Negative.   Cardiovascular: Negative.   Gastrointestinal: Negative.   Genitourinary: Negative.   Musculoskeletal: Positive for joint pain.  Skin: Negative.   Neurological: Positive for dizziness.  Endo/Heme/Allergies: Negative.   Psychiatric/Behavioral: Positive for depression. Negative for suicidal ideas, hallucinations and substance abuse. The patient is nervous/anxious.        History   Social History  . Marital Status: Married    Spouse Name: N/A    Number of Children: N/A  . Years of Education: N/A   Occupational History  . Not on file.   Social History Main Topics  . Smoking status: Former Smoker    Quit date: 03/21/2010  . Smokeless tobacco: Never Used  . Alcohol Use: Yes     occasional  . Drug Use: No  . Sexually Active: Not on file   Other Topics Concern  . Not on file   Social History Narrative  . No narrative on file    Objective:  BP 122/82  Pulse 104  Temp 98.7 F (37.1 C) (Oral)  Ht 5' (1.524 m)  Wt 242 lb (109.77 kg)  BMI 47.26 kg/m2  SpO2 95%  General appearance: alert, cooperative and appears stated age Ears: normal TM's and external  ear canals both ears Throat: lips, mucosa, and tongue normal; teeth and gums normal Neck: no adenopathy, no carotid bruit, supple, symmetrical, trachea midline and thyroid not enlarged, symmetric, no tenderness/mass/nodules Back: symmetric, no curvature. ROM normal. No CVA tenderness. Lungs: clear to auscultation bilaterally Heart: regular rate and rhythm, S1, S2 normal, no murmur, click, rub or gallop Abdomen: soft, non-tender; bowel sounds normal; no masses,  no organomegaly Pulses: 2+ and symmetric Skin: Skin color, texture, turgor normal. No rashes or lesions Lymph nodes: Cervical, supraclavicular, and axillary nodes normal.  Assessment and Plan:  Hypertension Currently well-controlled  on diastolic. No changes today.  Obesity, Class III, BMI 40-49.9 (morbid obesity) BMI is 47. Management of her obesity obesity is  complicated by multiple joint pains, balance disorder and medications used to treat her depression and anxiety.  She is receiving PT and her balance is improving. We cannot change her medications for depression and anxiety due to the severe nature of her illness. I have recommended a low glycemic index diet utilizing smaller more frequent meals to increase metabolism.  I have also recommended that patient start using a swimming pool  with a goal of 30 minutes of aerobic exercise a minimum of 5 days per week. Screening for lipid disorders, thyroid and diabetes to be done today.     Updated Medication List Outpatient Encounter Prescriptions as of 07/22/2011  Medication Sig Dispense Refill  . albuterol (PROVENTIL HFA;VENTOLIN HFA) 108 (90 BASE) MCG/ACT inhaler Inhale 2 puffs into the lungs every 6 (six) hours as needed.      . baclofen (LIORESAL) 10 MG tablet Take 1 tablet (10 mg total) by mouth as needed.  30 each  6  . clonazePAM (KLONOPIN) 1 MG tablet Take 1 mg by mouth 3 (three) times daily as needed.        . cyanocobalamin (,VITAMIN B-12,) 1000 MCG/ML injection Inject 1 ml into muscle monthly  10 mL  1  . Diclofenac Sodium 3 % GEL Place onto the skin. Diclofenac,baclofen, cyclobenzaprine, lidocaine      . Escitalopram Oxalate (LEXAPRO PO) Take 30 mg by mouth daily.       . furosemide (LASIX) 20 MG tablet Take 1 tablet (20 mg total) by mouth 2 (two) times daily.  30 tablet  6  . levothyroxine (SYNTHROID, LEVOTHROID) 88 MCG tablet TAKE 1 TABLET (88 MCG TOTAL) BY MOUTH DAILY.  30 tablet  3  . Nebivolol HCl (BYSTOLIC PO) Take 2.5 mg by mouth daily.       . nortriptyline (PAMELOR) 10 MG capsule Take 10 mg by mouth at bedtime. Take one capsule at bedtime for 10 days, then take two capsules at bedtime       . oxyCODONE-acetaminophen (PERCOCET) 5-325 MG per tablet Take 1  tablet by mouth every 4 (four) hours as needed.      . Trifluoroacetic Acid LIQD by Does not apply route.      . phentermine (ADIPEX-P) 37.5 MG tablet Take 0.5 tablets (18.75 mg total) by mouth 2 (two) times daily before a meal.  30 tablet  1     Orders Placed This Encounter  Procedures  . Hemoglobin A1c  . COMPLETE METABOLIC PANEL WITH GFR  . Lipid panel  . TSH    Return in about 6 months (around 01/22/2012).

## 2011-07-22 NOTE — Patient Instructions (Addendum)
To increase your vitamin d , start taking 1000 units of vitamin D daily and one tums daily for calcium  We are referring you for a venous ultrasound to evaluate lower extremity edema   Consider a Low Glycemic Index Diet and eating 6 smaller meals daily .  This frequent feeding stimulates your metabolism and the lower glycemic index foods w blood sugars:   This is an example of my daily  "Low GI"  Diet:  All of the foods can be found at grocery stores and in bulk at [employer]  club   7 AM Breakfast:  Low carbohydrate Protein  Shakes (I recommend the EAS AdvantEdge "Carb Control" shakes  Or the low carb shakes by Atkins.   Both are available everywhere:  In  cases at [employer]Js  Or in 4 packs at grocery stores and pharmacies  2.5 carbs  (Alternative is  a toasted Arnold's Sandwhich Thin w/ peanut butter, a "Bagel Thin" with cream cheese and salmon) or  a scrambled egg burrito made with a low carb tortilla .  Avoid cereal and bananas, oatmeal too unless the old fashioned kind that takes 30-40 minutes to prepare.  the rest is overly processed, has minimal fiber, and loaded with carbohydrates!   10 AM: Protein bar by Atkins (the snack size, under 200 cal.  There are many varieties , available widely again or in bulk in limited varieties at BJs)  Other so called "protein bars" tend to be loaded with carbohydrates.  Remember, in food advertising, the word "energy" is synonymous for " carbohydrate."  Lunch: sandwich of [place], (or any lunchmeat or canned tuna), fresh avocado and cheese on a lower carbohydrate pita bread, flatbread, or tortilla . Ok to use mayonnaise. The bread is the only source or carbohydrate that can be decreased (Joseph's makes a pita bread and a flat bread  Are 50 cal and 4 net carbs ; Toufayan makes a low carb flatbread 100 cal and 9 net carbs  and  Mission makes a low carb whole wheat tortilla  210 cal and 6 net carbs)  3 PM:  Mid day :  Another protein bar by Atkins,  Or a  cheese stick (100  cal, 0 carbs),  Or 1 ounce of  almonds, walnuts, pistachios, pecans, peanuts,  Macadamia nuts. Or a Dannon light n Fit greek yogurt, 80 cal 8 net carbs . Avoid "granola"; the dried cranberries and raisins are loaded with carbohydrates.    6 PM  Dinner:  "mean and green:"  Meat/chicken/fish or a high protein legume; , with a green salad, and a low GI  Veggie (broccoli, cauliflower, green beans, spinach, brussel sprouts. Lima beans) : Avoid "Low fat dressings, Reyne Dumas and [address]! They are loaded with sugar! Instead use ranch, vinagrette,  Blue cheese, etc  9 PM snack : Breyer's "low carb" fudgsicle or  ice cream bar (Carb Smart line), or  Weight Watcher's ice cream bar , or anouther "no sugar added" ice cream; or another protein shake or a serving of fresh fruit with whipped cream (Avoid bananas, pineapple, grapes  and watermelon on a regular basis because they are high in sugar)   Remember that snack Substitutions should be less than 15 to 20 carbs  Per serving. Remember to subtract fiber grams to get the "net carbs."

## 2011-07-25 NOTE — Assessment & Plan Note (Addendum)
BMI is 47. Management of her obesity obesity is  complicated by multiple joint pains, balance disorder and medications used to treat her depression and anxiety.  She is receiving PT and her balance is improving. We cannot change her medications for depression and anxiety due to the severe nature of her illness. I have recommended a low glycemic index diet utilizing smaller more frequent meals to increase metabolism.  I have also recommended that patient start using a swimming pool  with a goal of 30 minutes of aerobic exercise a minimum of 5 days per week. Screening for lipid disorders, thyroid and diabetes to be done today.

## 2011-07-25 NOTE — Assessment & Plan Note (Signed)
Currently well-controlled on diastolic. No changes today.

## 2011-08-04 ENCOUNTER — Encounter: Payer: Self-pay | Admitting: Otolaryngology

## 2011-08-30 ENCOUNTER — Ambulatory Visit: Payer: Self-pay | Admitting: Otolaryngology

## 2011-09-05 ENCOUNTER — Other Ambulatory Visit: Payer: Self-pay | Admitting: Internal Medicine

## 2011-09-08 ENCOUNTER — Emergency Department: Payer: Self-pay | Admitting: Emergency Medicine

## 2011-09-20 ENCOUNTER — Telehealth: Payer: Self-pay | Admitting: *Deleted

## 2011-09-20 MED ORDER — HYDROCODONE-ACETAMINOPHEN 10-325 MG PO TABS: 1.0000 | ORAL_TABLET | Freq: Three times a day (TID) | ORAL | Status: DC | PRN

## 2011-09-20 NOTE — Telephone Encounter (Signed)
Patient states that she broke her ankle x10 days ago & was given Vicodin 10 mg initially; she was referred to Dr. Marlise Eves and "mistakenly told him that she was taking 5 mg", which is only what he will px & is subtherapeutic. Pt requesting Rx for Vicodin 10-325 for uncontrolled pain from injury [pt undeniably upset/crying on phone]/[facility] Per Vo, Dr. Darrick Huntsman, patient informed request was approved by MD and Rx phoned to pharmacy, after verifying w/patient: [facility]

## 2011-09-21 ENCOUNTER — Telehealth: Payer: Self-pay | Admitting: Internal Medicine

## 2011-09-21 NOTE — Telephone Encounter (Signed)
I called the pharmacy and the pharmacist stated the 10-325 mg was called in yesterday by Jasmine December, but the pharmacy wrote it down wrong.  They are fixing the issue and will have the medication ready soon.  I spoke to patients husband and notified him, he understands and will pick up the Rx when ready.

## 2011-09-21 NOTE — Telephone Encounter (Signed)
Caller: [name]; Phone: [phone]; Reason for Call: Husband calling regarding a prescription was called in yesterday for Hydrocodone 5mg .  Wife states she was told that 10mg  would be called in.  Please call husband back.  Thanks

## 2011-10-11 ENCOUNTER — Ambulatory Visit: Payer: 59

## 2011-11-02 ENCOUNTER — Telehealth: Payer: Self-pay | Admitting: Internal Medicine

## 2011-11-02 NOTE — Telephone Encounter (Signed)
Yes, have her make a RN appt for tomorrow afternoon

## 2011-11-02 NOTE — Telephone Encounter (Signed)
Pt was calling concerning the Hep ABC shots. She was getting those shots and was needing her 3rd Booster. She was wanting to know if she could come in and get that ???

## 2011-11-04 ENCOUNTER — Ambulatory Visit (INDEPENDENT_AMBULATORY_CARE_PROVIDER_SITE_OTHER): Payer: 59 | Admitting: Internal Medicine

## 2011-11-04 DIAGNOSIS — Z23 Encounter for immunization: Secondary | ICD-10-CM

## 2011-11-04 NOTE — Progress Notes (Signed)
Patient ID: [name], female   DOB: Aug 19, 1955, 56 y.o.   MRN: [id number] Patient is here for her 3rd Hep B vaccine.

## 2012-01-16 ENCOUNTER — Other Ambulatory Visit: Payer: Self-pay | Admitting: Internal Medicine

## 2012-01-16 NOTE — Telephone Encounter (Signed)
Med filled.  

## 2012-01-23 ENCOUNTER — Other Ambulatory Visit (INDEPENDENT_AMBULATORY_CARE_PROVIDER_SITE_OTHER): Payer: 59

## 2012-01-23 DIAGNOSIS — R7309 Other abnormal glucose: Secondary | ICD-10-CM

## 2012-01-23 DIAGNOSIS — E785 Hyperlipidemia, unspecified: Secondary | ICD-10-CM

## 2012-01-23 DIAGNOSIS — E039 Hypothyroidism, unspecified: Secondary | ICD-10-CM

## 2012-01-23 DIAGNOSIS — R739 Hyperglycemia, unspecified: Secondary | ICD-10-CM

## 2012-01-23 LAB — TSH: TSH: 0.66 u[IU]/mL (ref 0.35–5.50)

## 2012-01-23 LAB — LIPID PANEL
HDL: 63.8 mg/dL (ref >39.00)
Triglycerides: 108 mg/dL (ref 0.0–149.0)
VLDL: 21.6 mg/dL (ref 0.0–40.0)

## 2012-01-23 LAB — HEMOGLOBIN A1C: Hgb A1c MFr Bld: 5.8 % (ref 4.6–6.5)

## 2012-01-24 LAB — COMPLETE METABOLIC PANEL WITH GFR
ALT: 13 U/L (ref 0–35)
Alkaline Phosphatase: 44 U/L (ref 39–117)
CO2: 26 mEq/L (ref 19–32)
Creat: 0.82 mg/dL (ref 0.50–1.10)
GFR, Est African American: >89 mL/min
GFR, Est Non African American: 79 mL/min
Sodium: 142 mEq/L (ref 135–145)
Total Bilirubin: 0.4 mg/dL (ref 0.3–1.2)

## 2012-01-30 ENCOUNTER — Ambulatory Visit (INDEPENDENT_AMBULATORY_CARE_PROVIDER_SITE_OTHER): Payer: 59 | Admitting: Internal Medicine

## 2012-01-30 ENCOUNTER — Encounter: Payer: Self-pay | Admitting: Internal Medicine

## 2012-01-30 VITALS — BP 120/80 | HR 70 | Temp 98.5°F | Resp 16 | Ht 65.0 in | Wt 234.0 lb

## 2012-01-30 DIAGNOSIS — E039 Hypothyroidism, unspecified: Secondary | ICD-10-CM

## 2012-01-30 DIAGNOSIS — K7689 Other specified diseases of liver: Secondary | ICD-10-CM

## 2012-01-30 DIAGNOSIS — R296 Repeated falls: Secondary | ICD-10-CM

## 2012-01-30 DIAGNOSIS — E785 Hyperlipidemia, unspecified: Secondary | ICD-10-CM

## 2012-01-30 DIAGNOSIS — E538 Deficiency of other specified B group vitamins: Secondary | ICD-10-CM

## 2012-01-30 DIAGNOSIS — R42 Dizziness and giddiness: Secondary | ICD-10-CM

## 2012-01-30 DIAGNOSIS — I10 Essential (primary) hypertension: Secondary | ICD-10-CM

## 2012-01-30 DIAGNOSIS — H8190 Unspecified disorder of vestibular function, unspecified ear: Secondary | ICD-10-CM

## 2012-01-30 DIAGNOSIS — Z9181 History of falling: Secondary | ICD-10-CM

## 2012-01-30 DIAGNOSIS — K760 Fatty (change of) liver, not elsewhere classified: Secondary | ICD-10-CM

## 2012-01-30 NOTE — Progress Notes (Signed)
Patient ID: [name], female   DOB: 1955/11/12, 57 y.o.   MRN: [id number]  Patient Active Problem List  Diagnosis  . Tobacco abuse  . Depression  . Hypothyroidism  . Hyperlipidemia  . Hypertension  . Arthritis  . Atopic dermatitis  . Anxiety  . B12 deficiency  . Fatty liver disease, nonalcoholic  . Obesity, Class III, BMI 40-49.9 (morbid obesity)    Subjective:  CC:   Chief Complaint  Patient presents with  . Follow-up    HPI:   [name] a 57 y.o. female who presents for 6 month follow up on obesity,  Hypertension, hyperlipidemia, and hypothyroidisim.  She continues to have Dizziness.  She stopped her dexedrine bc it aggravates her anxiety a lot more.  She had a left ankle fracture from a fall in October.  S/p rehab .  Was using a walker because of balance issues aggravated by a a walking cast.   Still falling occasionally.  The falls always occur on the stairs at her house because there are no railings on her stairs .  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 She had an infected tooth recently which eroded  into maxillary sinus on the right. It occcurred 2 months ago. Oral surgery treated both with tooth extraction and patch.   Recent bilateral shoulder injections with cortisone, one by [employer], one by [employer],  Immediate improvement.  History of rotator cuff repair on the left    Past Medical History  Diagnosis Date  . Tobacco abuse   . Depression   . Thyroid disease   . Hyperlipidemia   . Hypertension   . Arthritis   . Atopic dermatitis     scalp  . Anxiety     Past Surgical History  Procedure Date  . Rotator cuff repair   . Lumbar disc surgery     The following portions of the patient's history were reviewed and updated as appropriate: Allergies, current  medications, and problem list.    Review of Systems:  Patient denies headache, fevers, malaise, unintentional weight loss, skin rash, eye pain, sinus congestion and sinus pain, sore throat, dysphagia,  hemoptysis , cough, dyspnea, wheezing, chest pain, palpitations, orthopnea, edema, abdominal pain, nausea, melena, diarrhea, constipation, flank pain, dysuria, hematuria, urinary  Frequency, nocturia, numbness, tingling, seizures,  Focal weakness, Loss of consciousness,  Tremor, insomnia, depression, anxiety, and suicidal ideation.        History   Social History  . Marital Status: Married    Spouse Name: N/A    Number of Children: N/A  . Years of Education: N/A   Occupational History  . Not on file.   Social History Main Topics  . Smoking status: Former Smoker    Quit date: 03/21/2010  . Smokeless tobacco: Never Used  . Alcohol Use: Yes     Comment: occasional  . Drug Use: No  . Sexually Active: Not on file   Other Topics Concern  . Not on file   Social History Narrative  . No narrative on file    Objective:  BP 120/80  Pulse 70  Temp 98.5 F (36.9 C) (Oral)  Resp 16  Ht 5\' 5"  (1.651 m)  Wt 234 lb (106.142 kg)  BMI 38.94 kg/m2  SpO2 96%  General appearance:  alert, cooperative and appears stated age Ears: normal TM's and external ear canals both ears Throat: lips, mucosa, and tongue normal; teeth and gums normal Neck: no adenopathy, no carotid bruit, supple, symmetrical, trachea midline and thyroid not enlarged, symmetric, no tenderness/mass/nodules Back: symmetric, no curvature. ROM normal. No CVA tenderness. Lungs: clear to auscultation bilaterally Heart: regular rate and rhythm, S1, S2 normal, no murmur, click, rub or gallop Abdomen: soft, non-tender; bowel sounds normal; no masses,  no organomegaly Pulses: 2+ and symmetric Skin: Skin color, texture, turgor normal. No rashes or lesions Lymph nodes: Cervical, supraclavicular, and axillary nodes  normal.  Assessment and Plan:  Fatty liver disease, nonalcoholic Liver enzymes are currently normal.  Triglycerides are normal and a1c is normal.  She is addressing her weight and elevated LDL with diet and exercise. Has completed the Hep A/B vaccine series.   Hypothyroidism TSH is WNL on current synthroid dose, no changes today  Hypertension Well controlled on current regimen. Renal function stable, no changes today.  B12 deficiency Monthly injections given by daughter   Hyperlipidemia Goal is LDL < 100  Vestibular dizziness Chronic, with prior ENT and neurologic evaluations suggesting inner ear as the cause.  Because of her recurrent falls and recent ankle fracture it  is important to motivate her to work on balance and strength..  PT evaluation recommended.    Updated Medication List Outpatient Encounter Prescriptions as of 01/30/2012  Medication Sig Dispense Refill  . albuterol (PROVENTIL HFA;VENTOLIN HFA) 108 (90 BASE) MCG/ACT inhaler Inhale 2 puffs into the lungs every 6 (six) hours as needed.      . baclofen (LIORESAL) 10 MG tablet Take 1 tablet (10 mg total) by mouth as needed.  30 each  6  . clonazePAM (KLONOPIN) 1 MG tablet Take 1 mg by mouth 3 (three) times daily as needed.        . cyanocobalamin (,VITAMIN B-12,) 1000 MCG/ML injection Inject 1 ml into muscle monthly  10 mL  1  . Diclofenac Sodium 3 % GEL Place onto the skin. Diclofenac,baclofen, cyclobenzaprine, lidocaine      . Escitalopram Oxalate (LEXAPRO PO) Take 30 mg by mouth daily.       . furosemide (LASIX) 20 MG tablet Take 1 tablet (20 mg total) by mouth 2 (two) times daily.  30 tablet  6  . HYDROcodone-acetaminophen (NORCO) 10-325 MG per tablet Take 1 tablet by mouth every 8 (eight) hours as needed for pain.  60 tablet  0  . levothyroxine (SYNTHROID, LEVOTHROID) 88 MCG tablet TAKE 1 TABLET (88 MCG TOTAL) BY MOUTH DAILY.  30 tablet  3  . Nebivolol HCl (BYSTOLIC PO) Take 2.5 mg by mouth daily.       .  nortriptyline (PAMELOR) 10 MG capsule Take 10 mg by mouth at bedtime. Take one capsule at bedtime for 10 days, then take two capsules at bedtime       . oxyCODONE-acetaminophen (PERCOCET) 5-325 MG per tablet Take 1 tablet by mouth every 4 (four) hours as needed.      . phentermine (ADIPEX-P) 37.5 MG tablet Take 0.5 tablets (18.75 mg total) by mouth 2 (two) times daily before a meal.  30 tablet  1  . Trifluoroacetic Acid LIQD by Does not apply route.

## 2012-01-30 NOTE — Patient Instructions (Addendum)
Your first goal is to lose 10%  Of your current body weight over the next 6 months  (24 lbs)   This is  my version of a  "Low GI"  Diet:  It is not ultra low carb, but will still lower your blood sugars and allow you to lose 5 to 10 lbs per month if you follow it carefully. All of the foods can be found at grocery stores and in bulk at [employer].  The Atkins protein bars and shakes are available in more varieties at [employer], [employer] and [employer].     7 AM Breakfast:  Low carbohydrate Protein  Shakes (I recommend the EAS AdvantEdge "Carb Control" shakes  Or the low carb shakes by Atkins.   Both are available everywhere:  In  cases at [employer]Js  Or in 4 packs at grocery stores and pharmacies  2.5 carbs  (Alternative is  a toasted Arnold's Sandwhich Thin w/ peanut butter, a "Bagel Thin" with cream cheese and salmon) or  a scrambled egg burrito made with a low carb tortilla .  Avoid cereal and bananas, oatmeal too unless you are cooking the old fashioned kind that takes 30-40 minutes to prepare.  the rest is overly processed, has minimal fiber, and is loaded with carbohydrates!   10 AM: Protein bar by Atkins (the snack size, under 200 cal).  There are many varieties , available widely again or in bulk in limited varieties at BJs)  Other so called "protein bars" tend to be loaded with carbohydrates.  Remember, in food advertising, the word "energy" is synonymous for " carbohydrate."  Lunch: sandwich of [place], (or any lunchmeat, grilled meat or canned tuna), fresh avocado, mayonnaise  and cheese on a lower carbohydrate pita bread, flatbread, or tortilla . Ok to use regular mayonnaise. The bread is the only source or carbohydrate that can be decreased (Joseph's makes a pita bread and a flat bread that are 50 cal and 4 net carbs ; Toufayan makes a low carb flatbread that's 100 cal and 9 net carbs  and  Mission makes a low carb whole wheat tortilla  That is 210 cal and 6 net carbs)  3 PM:  Mid day :  Another  protein bar,  Or a  cheese stick (100 cal, 0 carbs),  Or 1 ounce of  almonds, walnuts, pistachios, pecans, peanuts,  Macadamia nuts. Or a Dannon light n Fit greek yogurt, 80 cal 8 net carbs . Avoid "granola"; the dried cranberries and raisins are loaded with carbohydrates. Mixed nuts ok if no raisins or cranberries or dried fruit.      6 PM  Dinner:  "mean and green:"  Meat/chicken/fish or a high protein legume; , with a green salad, and a low GI  Veggie (broccoli, cauliflower, green beans, spinach, brussel sprouts. Lima beans) : Avoid "Low fat dressings, as well as Reyne Dumas and [address]! They are loaded with sugar! Instead use ranch, vinagrette,  Blue cheese, etc.  There is a low carb pasta by Dreamfield's available at [facility] that is acceptable and tastes great. Try Michel Angel's chicken piccata over low carb pasta. The chicken dish is 0 carbs, and can be found in frozen section at BJs and [employer]. Also try [employer]" (pulled pork, no sauce,  0 carbs) and his pot roast.   both are in the refrigerated section at BJs   Dreamfield's makes a low carb pasta only 5 g/serving.  Available at all grocery stores,  And tastes like normal pasta  9 PM snack : Breyer's "low carb" fudgsicle or  ice cream bar (Carb Smart line), or  Weight Watcher's ice cream bar , or another "no sugar added" ice cream;a serving of fresh berries/cherries with whipped cream (Avoid bananas, pineapple, grapes  and watermelon on a regular basis because they are high in sugar)   Remember that snack Substitutions should be less than 10 carbs per serving and meals < 20 carbs. Remember to subtract fiber grams and sugar alcohols to get the "net carbs."

## 2012-01-31 DIAGNOSIS — H8190 Unspecified disorder of vestibular function, unspecified ear: Secondary | ICD-10-CM | POA: Insufficient documentation

## 2012-01-31 DIAGNOSIS — R42 Dizziness and giddiness: Secondary | ICD-10-CM | POA: Insufficient documentation

## 2012-01-31 NOTE — Assessment & Plan Note (Signed)
TSH is WNL on current synthroid dose, no changes today

## 2012-01-31 NOTE — Assessment & Plan Note (Signed)
Well controlled on current regimen. Renal function stable, no changes today. 

## 2012-01-31 NOTE — Assessment & Plan Note (Signed)
Chronic, with prior ENT and neurologic evaluations suggesting inner ear as the cause.  Because of her recurrent falls and recent ankle fracture it  is important to motivate her to work on balance and strength..  PT evaluation recommended.

## 2012-01-31 NOTE — Assessment & Plan Note (Signed)
Goal is LDL < 100

## 2012-01-31 NOTE — Assessment & Plan Note (Signed)
Monthly injections given by daughter

## 2012-01-31 NOTE — Assessment & Plan Note (Addendum)
Liver enzymes are currently normal.  Triglycerides are normal and a1c is normal.  She is addressing her weight and elevated LDL with diet and exercise. Has completed the Hep A/B vaccine series.

## 2012-03-06 ENCOUNTER — Encounter: Payer: Self-pay | Admitting: Internal Medicine

## 2012-03-27 ENCOUNTER — Telehealth: Payer: Self-pay | Admitting: Internal Medicine

## 2012-03-27 NOTE — Telephone Encounter (Signed)
Patient Information:  Caller Name: Katharina  Phone: [phone]  Patient: [name], [name]  Gender: Female  DOB: 1955/08/13  Age: 57 Years  PCP: Duncan Dull (Adults only)  Office Follow Up:  Does the office need to follow up with this patient?: Yes  Instructions For The Office: Requests Rx for valium; leaving on long car trip in AM 03/28/12 krs/can  RN Note:  Has been doing PT and exercises.  Back pain/spasms flared up 03/26/12.  States valium worked well in the past; would like Rx for this.  Per back pain protocol, emergent symptoms denied; advised follow up appt within 2 weeks.  Patient declines new appt at this time; states leaving on car trip 03/28/12 and would like Rx called in.  Info to office for provider review/Rx/callback.  [facility]  May reach patient at [phone].  krs/can  Symptoms  Reason For Call & Symptoms: referred to rehab for back and balance problems.  States having spasms.  States she needs to take a long car trip and would like valium to take for the spasm.  Reviewed Health History In EMR: Yes  Reviewed Medications In EMR: Yes  Reviewed Allergies In EMR: Yes  Reviewed Surgeries / Procedures: Yes  Date of Onset of Symptoms: 03/26/2012  Guideline(s) Used:  Back Pain  Disposition Per Guideline:   See Within 2 Weeks in Office  Reason For Disposition Reached:   Back pain is a chronic symptom (recurrent or ongoing AND lasting > 4 weeks)  Advice Given:  N/A  Patient Refused Recommendation:  Patient Requests Prescription  Requesting Rx for valium; appt declined, as leaving for long car trip in AM 03/28/12 krs/can

## 2012-04-03 ENCOUNTER — Encounter: Payer: Self-pay | Admitting: Internal Medicine

## 2012-04-30 ENCOUNTER — Encounter: Payer: Self-pay | Admitting: Internal Medicine

## 2012-04-30 ENCOUNTER — Ambulatory Visit (INDEPENDENT_AMBULATORY_CARE_PROVIDER_SITE_OTHER): Payer: 59 | Admitting: Internal Medicine

## 2012-04-30 VITALS — BP 120/72 | HR 74 | Temp 98.1°F | Resp 16 | Wt 230.2 lb

## 2012-04-30 DIAGNOSIS — E538 Deficiency of other specified B group vitamins: Secondary | ICD-10-CM

## 2012-04-30 DIAGNOSIS — H8190 Unspecified disorder of vestibular function, unspecified ear: Secondary | ICD-10-CM

## 2012-04-30 DIAGNOSIS — F411 Generalized anxiety disorder: Secondary | ICD-10-CM

## 2012-04-30 DIAGNOSIS — K635 Polyp of colon: Secondary | ICD-10-CM | POA: Insufficient documentation

## 2012-04-30 DIAGNOSIS — I10 Essential (primary) hypertension: Secondary | ICD-10-CM

## 2012-04-30 DIAGNOSIS — Z1211 Encounter for screening for malignant neoplasm of colon: Secondary | ICD-10-CM

## 2012-04-30 DIAGNOSIS — F419 Anxiety disorder, unspecified: Secondary | ICD-10-CM

## 2012-04-30 MED ORDER — CLONAZEPAM 1 MG PO TABS: 1.0000 mg | ORAL_TABLET | Freq: Two times a day (BID) | ORAL | Status: DC | PRN

## 2012-04-30 MED ORDER — LEVOTHYROXINE SODIUM 88 MCG PO TABS: ORAL_TABLET | ORAL | Status: DC

## 2012-04-30 MED ORDER — CYANOCOBALAMIN 1000 MCG/ML IJ SOLN
1000.0000 ug | Freq: Once | INTRAMUSCULAR | Status: AC
  Administered 2012-04-30: 1000 ug via INTRAMUSCULAR

## 2012-04-30 NOTE — Assessment & Plan Note (Signed)
improving with PT.  No fall in oer 3 months

## 2012-04-30 NOTE — Assessment & Plan Note (Signed)
Refill clonazepam for one month.

## 2012-04-30 NOTE — Assessment & Plan Note (Signed)
Over due for colonoscopy at [facility] .

## 2012-04-30 NOTE — Assessment & Plan Note (Signed)
Well controlled on current regimen. Renal function stable, no changes today. 

## 2012-04-30 NOTE — Assessment & Plan Note (Signed)
She has missed several months of injections.  Resume here  monthly.

## 2012-04-30 NOTE — Patient Instructions (Addendum)

## 2012-04-30 NOTE — Progress Notes (Signed)
Patient ID: [name], female   DOB: 18-May-1955, 57 y.o.   MRN: [id number]   Patient Active Problem List   Diagnosis Date Noted  . Colon cancer screening 04/30/2012  . Vestibular dizziness 01/31/2012  . B12 deficiency 04/11/2011  . Fatty liver disease, nonalcoholic 04/11/2011  . Obesity, Class III, BMI 40-49.9 (morbid obesity) 04/11/2011  . Tobacco abuse   . Depression   . Hypothyroidism   . Hyperlipidemia   . Hypertension   . Arthritis   . Atopic dermatitis   . Anxiety     Subjective:  CC:   Chief Complaint  Patient presents with  . Follow-up    HPI:   [name] a 57 y.o. female who presents 3 month follow up on chronic medical conditions including anxiety disorder, fatty liver, dizziness and obesity.  She has run out of her clonazepam, which she takes twice daily.  She is scheduled to see a new psychiatric NP tomorrow.    Seeing PT for balance disorder once or twice weekly .  Her balance has improved,  But she has noted that her right leg is weaker than left .  Told by PT that her left leg is shorter so she was given a lift which she is not using regularly yet,. No falls in the last 3 months.     She has lost 12 lbs since July,  4 since January..  She wants to resume the low glycemic index diet.       Past Medical History  Diagnosis Date  . Tobacco abuse   . Depression   . Thyroid disease   . Hyperlipidemia   . Hypertension   . Arthritis   . Atopic dermatitis     scalp  . Anxiety     Past Surgical History  Procedure Laterality Date  . Rotator cuff repair    . Lumbar disc surgery         The following portions of the patient's history were reviewed and updated as appropriate: Allergies, current medications, and problem list.    Review of Systems:   Patient denies headache, fevers, malaise, unintentional weight loss, skin rash, eye pain, sinus congestion and sinus pain, sore throat, dysphagia,  hemoptysis , cough, dyspnea, wheezing, chest pain,  palpitations, orthopnea, edema, abdominal pain, nausea, melena, diarrhea, constipation, flank pain, dysuria, hematuria, urinary  Frequency, nocturia, numbness, tingling, seizures,  Focal weakness, Loss of consciousness,  Tremor, insomnia, depression, anxiety, and suicidal ideation.      History   Social History  . Marital Status: Married    Spouse Name: N/A    Number of Children: N/A  . Years of Education: N/A   Occupational History  . Not on file.   Social History Main Topics  . Smoking status: Current Every Day Smoker -- 0.40 packs/day    Types: Cigarettes    Last Attempt to Quit: 03/21/2010  . Smokeless tobacco: Never Used  . Alcohol Use: Yes     Comment: occasional  . Drug Use: No  . Sexually Active: Not on file   Other Topics Concern  . Not on file   Social History Narrative  . No narrative on file    Objective:  BP 120/72  Pulse 74  Temp(Src) 98.1 F (36.7 C) (Oral)  Resp 16  Wt 230 lb 4 oz (104.441 kg)  BMI 38.32 kg/m2  SpO2 96%  General appearance: alert, cooperative and appears stated age Ears: normal TM's and external ear canals both ears Throat: lips,  mucosa, and tongue normal; teeth and gums normal Neck: no adenopathy, no carotid bruit, supple, symmetrical, trachea midline and thyroid not enlarged, symmetric, no tenderness/mass/nodules Back: symmetric, no curvature. ROM normal. No CVA tenderness. Lungs: clear to auscultation bilaterally Heart: regular rate and rhythm, S1, S2 normal, no murmur, click, rub or gallop Abdomen: soft, non-tender; bowel sounds normal; no masses,  no organomegaly Pulses: 2+ and symmetric Skin: Skin color, texture, turgor normal. No rashes or lesions Lymph nodes: Cervical, supraclavicular, and axillary nodes normal.  Assessment and Plan:  Colon cancer screening Over due for colonoscopy at [facility] .    Vestibular dizziness improving with PT.  No fall in oer 3 months   Obesity, Class III, BMI 40-49.9 (morbid  obesity) I have addressed  BMI and recommended resuming a low glycemic index diet utilizing smaller more frequent meals to increase metabolism.  I have also recommended that patient start exercising with a goal of 30 minutes of aerobic exercise a minimum of 5 days per week.   Hypertension Well controlled on current regimen. Renal function stable, no changes today.  B12 deficiency She has missed several months of injections.  Resume here  monthly.   Anxiety Refill clonazepam for one month.    Updated Medication List Outpatient Encounter Prescriptions as of 04/30/2012  Medication Sig Dispense Refill  . baclofen (LIORESAL) 10 MG tablet Take 1 tablet (10 mg total) by mouth as needed.  30 each  6  . clonazePAM (KLONOPIN) 1 MG tablet Take 1 tablet (1 mg total) by mouth 2 (two) times daily as needed.  60 tablet  0  . Escitalopram Oxalate (LEXAPRO PO) Take 30 mg by mouth daily.       . furosemide (LASIX) 20 MG tablet Take 1 tablet (20 mg total) by mouth 2 (two) times daily.  30 tablet  6  . levothyroxine (SYNTHROID, LEVOTHROID) 88 MCG tablet TAKE 1 TABLET (88 MCG TOTAL) BY MOUTH DAILY.  90 tablet  3  . Nebivolol HCl (BYSTOLIC PO) Take 2.5 mg by mouth daily.       . Trifluoroacetic Acid LIQD by Does not apply route.      . [DISCONTINUED] clonazePAM (KLONOPIN) 1 MG tablet Take 1 mg by mouth 3 (three) times daily as needed.        . [DISCONTINUED] levothyroxine (SYNTHROID, LEVOTHROID) 88 MCG tablet TAKE 1 TABLET (88 MCG TOTAL) BY MOUTH DAILY.  30 tablet  3  . albuterol (PROVENTIL HFA;VENTOLIN HFA) 108 (90 BASE) MCG/ACT inhaler Inhale 2 puffs into the lungs every 6 (six) hours as needed.      . cyanocobalamin (,VITAMIN B-12,) 1000 MCG/ML injection Inject 1 ml into muscle monthly  10 mL  1  . Diclofenac Sodium 3 % GEL Place onto the skin. Diclofenac,baclofen, cyclobenzaprine, lidocaine      . HYDROcodone-acetaminophen (NORCO) 10-325 MG per tablet Take 1 tablet by mouth every 8 (eight) hours as needed  for pain.  60 tablet  0  . nortriptyline (PAMELOR) 10 MG capsule Take 10 mg by mouth at bedtime. Take one capsule at bedtime for 10 days, then take two capsules at bedtime       . oxyCODONE-acetaminophen (PERCOCET) 5-325 MG per tablet Take 1 tablet by mouth every 4 (four) hours as needed.      . phentermine (ADIPEX-P) 37.5 MG tablet Take 0.5 tablets (18.75 mg total) by mouth 2 (two) times daily before a meal.  30 tablet  1  . [EXPIRED] cyanocobalamin ((VITAMIN B-12)) injection 1,000 mcg  No facility-administered encounter medications on file as of 04/30/2012.     Orders Placed This Encounter  Procedures  . HM MAMMOGRAPHY  . HM PAP SMEAR    No Follow-up on file.

## 2012-04-30 NOTE — Assessment & Plan Note (Signed)
I have addressed  BMI and recommended resuming a low glycemic index diet utilizing smaller more frequent meals to increase metabolism.  I have also recommended that patient start exercising with a goal of 30 minutes of aerobic exercise a minimum of 5 days per week.

## 2012-05-03 ENCOUNTER — Encounter: Payer: Self-pay | Admitting: Internal Medicine

## 2012-05-21 ENCOUNTER — Other Ambulatory Visit: Payer: Self-pay | Admitting: Internal Medicine

## 2012-05-21 NOTE — Telephone Encounter (Signed)
Rx sent to pharmacy by escript  

## 2012-06-03 ENCOUNTER — Encounter: Payer: Self-pay | Admitting: Internal Medicine

## 2012-06-23 ENCOUNTER — Inpatient Hospital Stay: Payer: Self-pay | Admitting: Specialist

## 2012-06-23 LAB — URINALYSIS, COMPLETE
Glucose,UR: NEGATIVE mg/dL (ref 0–75)
Nitrite: NEGATIVE
Ph: 6 (ref 4.5–8.0)
WBC UR: 128 /HPF (ref 0–5)

## 2012-06-23 LAB — COMPREHENSIVE METABOLIC PANEL
Albumin: 3.7 g/dL (ref 3.4–5.0)
BUN: 15 mg/dL (ref 7–18)
Calcium, Total: 10.4 mg/dL — ABNORMAL HIGH (ref 8.5–10.1)
Chloride: 104 mmol/L (ref 98–107)
Co2: 27 mmol/L (ref 21–32)
Creatinine: 0.85 mg/dL (ref 0.60–1.30)
EGFR (African American): >60
Glucose: 128 mg/dL — ABNORMAL HIGH (ref 65–99)
Osmolality: 280 (ref 275–301)
SGOT(AST): 400 U/L — ABNORMAL HIGH (ref 15–37)
Sodium: 139 mmol/L (ref 136–145)
Total Protein: 7.1 g/dL (ref 6.4–8.2)

## 2012-06-23 LAB — CBC
MCH: 30.7 pg (ref 26.0–34.0)
MCV: 89 fL (ref 80–100)
Platelet: 268 10*3/uL (ref 150–440)
RBC: 4.73 10*6/uL (ref 3.80–5.20)
RDW: 13.9 % (ref 11.5–14.5)
WBC: 9.3 10*3/uL (ref 3.6–11.0)

## 2012-06-23 LAB — LIPASE, BLOOD: Lipase: >10000 U/L — ABNORMAL HIGH (ref 73–393)

## 2012-06-24 LAB — COMPREHENSIVE METABOLIC PANEL
Albumin: 3.3 g/dL — ABNORMAL LOW (ref 3.4–5.0)
Anion Gap: 8 (ref 7–16)
Bilirubin,Total: 0.4 mg/dL (ref 0.2–1.0)
EGFR (Non-African Amer.): >60
Glucose: 116 mg/dL — ABNORMAL HIGH (ref 65–99)
Osmolality: 287 (ref 275–301)
SGOT(AST): 104 U/L — ABNORMAL HIGH (ref 15–37)
SGPT (ALT): 225 U/L — ABNORMAL HIGH (ref 12–78)
Sodium: 143 mmol/L (ref 136–145)

## 2012-06-24 LAB — CBC WITH DIFFERENTIAL/PLATELET
Basophil #: 0 10*3/uL (ref 0.0–0.1)
Basophil %: 0.3 %
HCT: 36.6 % (ref 35.0–47.0)
Lymphocyte #: 1.6 10*3/uL (ref 1.0–3.6)
Lymphocyte %: 15 %
MCH: 30.4 pg (ref 26.0–34.0)
MCHC: 33.5 g/dL (ref 32.0–36.0)
MCV: 91 fL (ref 80–100)
Monocyte #: 0.5 x10 3/mm (ref 0.2–0.9)
Monocyte %: 4.3 %
Neutrophil #: 8.6 10*3/uL — ABNORMAL HIGH (ref 1.4–6.5)
Neutrophil %: 80.4 %
Platelet: 251 10*3/uL (ref 150–440)
RDW: 14.5 % (ref 11.5–14.5)
WBC: 10.8 10*3/uL (ref 3.6–11.0)

## 2012-06-24 LAB — LIPID PANEL
HDL Cholesterol: 62 mg/dL — ABNORMAL HIGH (ref 40–60)
Ldl Cholesterol, Calc: 87 mg/dL (ref 0–100)
VLDL Cholesterol, Calc: 18 mg/dL (ref 5–40)

## 2012-06-24 LAB — LIPASE, BLOOD: Lipase: 2168 U/L — ABNORMAL HIGH (ref 73–393)

## 2012-06-25 LAB — COMPREHENSIVE METABOLIC PANEL
BUN: 16 mg/dL (ref 7–18)
Bilirubin,Total: 0.4 mg/dL (ref 0.2–1.0)
Co2: 28 mmol/L (ref 21–32)
EGFR (African American): >60
EGFR (Non-African Amer.): >60
Glucose: 115 mg/dL — ABNORMAL HIGH (ref 65–99)
Osmolality: 283 (ref 275–301)
Potassium: 4.5 mmol/L (ref 3.5–5.1)
SGOT(AST): 49 U/L — ABNORMAL HIGH (ref 15–37)

## 2012-06-25 LAB — LIPASE, BLOOD: Lipase: 179 U/L (ref 73–393)

## 2012-06-26 ENCOUNTER — Telehealth: Payer: Self-pay | Admitting: Internal Medicine

## 2012-06-26 NOTE — Telephone Encounter (Signed)
7.8.14 hospital follow up

## 2012-06-27 LAB — LIPASE, BLOOD: Lipase: 232 U/L (ref 73–393)

## 2012-06-29 ENCOUNTER — Ambulatory Visit (INDEPENDENT_AMBULATORY_CARE_PROVIDER_SITE_OTHER): Payer: 59 | Admitting: Adult Health

## 2012-06-29 ENCOUNTER — Encounter: Payer: Self-pay | Admitting: Adult Health

## 2012-06-29 VITALS — BP 110/84 | HR 67 | Temp 98.8°F | Resp 12 | Wt 234.5 lb

## 2012-06-29 DIAGNOSIS — Z09 Encounter for follow-up examination after completed treatment for conditions other than malignant neoplasm: Secondary | ICD-10-CM | POA: Insufficient documentation

## 2012-06-29 NOTE — Patient Instructions (Addendum)
  Please go to the ED for evaluation of mental status changes and continued, severe abdominal pain with nausea.

## 2012-06-29 NOTE — Progress Notes (Signed)
Subjective:    Patient ID: [name], female    DOB: 12-29-1955, 57 y.o.   MRN: [id number]  HPI  Patient is a 57 year old female who presents to clinic in followup of recent hospitalization from 06/23/2012 to 06/27/2012 for acute pancreatitis. On admission, patient's lipase levels were greater than 10,000 and she was experiencing epigastric and right upper quadrant abdominal pain with nausea and vomiting. She was discharged home on Wednesday and patient reports that her pain level was between a 5 and 6. She presents to clinic today with nausea and significant abdominal pain. Pain is throughout abdomen and also in her back. Husband reports that the patient was discharged home. No pain meds prescribed. Husband has noticed that patient's level of consciousness appears "more sedated" yet she has not taken any new medications or narcotics.  Patient's workup in the hospital included a CT scan of the abdomen and pelvis consistent with pancreatitis without evidence of pseudocyst formation or necrosis. Fatty liver. MRCP shows no intrahepatic or extrahepatic biliary ductal dilatation. An abdominal ultrasound showed no evidence of acute cholecystitis. Patient was seen in consult by GI, Dr. Marva Panda.  Incidentally, patient was found to have UTI and was treated with Cipro.   Past Medical History  Diagnosis Date  . Tobacco abuse   . Depression   . Thyroid disease   . Hyperlipidemia   . Hypertension   . Arthritis   . Atopic dermatitis     scalp  . Anxiety     Past Surgical History  Procedure Laterality Date  . Rotator cuff repair    . Lumbar disc surgery      Family History  Problem Relation Age of Onset  . Arthritis Mother   . Heart disease Father      History   Social History  . Marital Status: Married    Spouse Name: N/A    Number of Children: N/A  . Years of Education: N/A   Occupational History  . Not on file.   Social History Main Topics  . Smoking status: Former Smoker --  0.40 packs/day    Types: Cigarettes    Quit date: 03/21/2010  . Smokeless tobacco: Never Used  . Alcohol Use: Yes     Comment: occasional  . Drug Use: No  . Sexually Active: Not on file   Other Topics Concern  . Not on file   Social History Narrative  . No narrative on file     Current Outpatient Prescriptions on File Prior to Visit  Medication Sig Dispense Refill  . albuterol (PROVENTIL HFA;VENTOLIN HFA) 108 (90 BASE) MCG/ACT inhaler Inhale 2 puffs into the lungs every 6 (six) hours as needed.      . baclofen (LIORESAL) 10 MG tablet Take 1 tablet (10 mg total) by mouth as needed.  30 each  6  . clonazePAM (KLONOPIN) 1 MG tablet Take 1 tablet (1 mg total) by mouth 2 (two) times daily as needed.  60 tablet  0  . cyanocobalamin (,VITAMIN B-12,) 1000 MCG/ML injection Inject 1 ml into muscle monthly  10 mL  1  . Diclofenac Sodium 3 % GEL Place onto the skin. Diclofenac,baclofen, cyclobenzaprine, lidocaine      . Escitalopram Oxalate (LEXAPRO PO) Take 30 mg by mouth daily.       . furosemide (LASIX) 20 MG tablet Take 1 tablet (20 mg total) by mouth 2 (two) times daily.  30 tablet  6  . levothyroxine (SYNTHROID, LEVOTHROID) 88 MCG tablet TAKE 1 TABLET (  88 MCG TOTAL) BY MOUTH DAILY.  30 tablet  8  . Nebivolol HCl (BYSTOLIC PO) Take 2.5 mg by mouth daily.        No current facility-administered medications on file prior to visit.     Review of Systems  Constitutional: Positive for fatigue. Negative for fever.  HENT: Negative.   Eyes: Negative.   Respiratory: Negative.   Cardiovascular: Negative.   Gastrointestinal: Positive for nausea and abdominal pain.       Significant abdominal pain especially around upper quadrants  Endocrine: Negative.   Genitourinary: Negative.   Musculoskeletal: Positive for back pain.       Using walker to ambulate. Unsteady without walker.  Skin: Negative.   Allergic/Immunologic: Negative.   Neurological: Positive for weakness.       "restless legs"   Hematological: Negative.   Psychiatric/Behavioral: The patient is nervous/anxious.     BP 110/84  Pulse 67  Temp(Src) 98.8 F (37.1 C) (Oral)  Resp 12  Wt 234 lb 8 oz (106.369 kg)  BMI 39.02 kg/m2  SpO2 97%    Objective:   Physical Exam  Constitutional:  Obese, uncomfortable patient appearing in distress secondary to pain  Cardiovascular: Normal rate and regular rhythm.   Pulmonary/Chest: Effort normal. No respiratory distress.  Abdominal: There is tenderness. There is guarding.  Patient's abdomen was extremely tender upon palpation especially over epigastric area. Bowel sounds hypoactive  Musculoskeletal:  Persistent leg movements.  Neurological:  Patient was able to answer questions appropriately but she appeared sedated. Speech was slow  Skin: Skin is warm and dry.  Psychiatric:  Patient was crying and stating "what is wrong with me"       Assessment & Plan:

## 2012-06-29 NOTE — Assessment & Plan Note (Signed)
Patient is seen in followup recent hospitalization. She was discharged on Wednesday after 5 day admission for acute pancreatitis with a lipase levels greater than 10,000. Patient presents with significant pain in the epigastric and upper quadrants, nausea and now also with alteration in mental status. I am sending patient back to the emergency room for further evaluation of her acute status.

## 2012-07-10 ENCOUNTER — Encounter: Payer: Self-pay | Admitting: Internal Medicine

## 2012-07-10 ENCOUNTER — Ambulatory Visit (INDEPENDENT_AMBULATORY_CARE_PROVIDER_SITE_OTHER): Payer: 59 | Admitting: Internal Medicine

## 2012-07-10 VITALS — BP 116/78 | HR 60 | Temp 98.4°F | Resp 16

## 2012-07-10 DIAGNOSIS — F3289 Other specified depressive episodes: Secondary | ICD-10-CM

## 2012-07-10 DIAGNOSIS — K7689 Other specified diseases of liver: Secondary | ICD-10-CM

## 2012-07-10 DIAGNOSIS — F329 Major depressive disorder, single episode, unspecified: Secondary | ICD-10-CM

## 2012-07-10 DIAGNOSIS — K760 Fatty (change of) liver, not elsewhere classified: Secondary | ICD-10-CM

## 2012-07-10 DIAGNOSIS — F32A Depression, unspecified: Secondary | ICD-10-CM

## 2012-07-10 DIAGNOSIS — K859 Acute pancreatitis without necrosis or infection, unspecified: Secondary | ICD-10-CM

## 2012-07-10 DIAGNOSIS — E039 Hypothyroidism, unspecified: Secondary | ICD-10-CM

## 2012-07-10 DIAGNOSIS — D649 Anemia, unspecified: Secondary | ICD-10-CM

## 2012-07-10 DIAGNOSIS — Z8719 Personal history of other diseases of the digestive system: Secondary | ICD-10-CM

## 2012-07-10 NOTE — Progress Notes (Signed)
Patient ID: [name], female   DOB: 14-Feb-1955, 57 y.o.   MRN: [id number]  Patient Active Problem List   Diagnosis Date Noted  . History of acute pancreatitis 07/11/2012  . Hospital discharge follow-up 06/29/2012  . Colon cancer screening 04/30/2012  . Vestibular dizziness 01/31/2012  . B12 deficiency 04/11/2011  . Fatty liver disease, nonalcoholic 04/11/2011  . Obesity, Class III, BMI 40-49.9 (morbid obesity) 04/11/2011  . Tobacco abuse   . Depression   . Hypothyroidism   . Hyperlipidemia   . Hypertension   . Arthritis   . Atopic dermatitis   . Anxiety     Subjective:  CC:   Chief Complaint  Patient presents with  . Follow-up    Hospital    HPI:   [name] a 57 y.o. female who presents 2nd hospital follow up for pancreatitis with lipase> 10K. Idiopathic etiology.   Concurrent UTI. And fatty liver on CT .  Seen by Raquel on June 27th after dc from [facility] on June  24,th sent back to ER for severe pain and lethargy.  Went home instead,  Ate some food and started to feel better.  Back to a regular diet tolerating most things.   cc: chronic fatigue,  Wakes up tired,  Has OSA on CPAP ,  Most recent  Test was < 2 years ago.    Past Medical History  Diagnosis Date  . Tobacco abuse   . Depression   . Thyroid disease   . Hyperlipidemia   . Hypertension   . Arthritis   . Atopic dermatitis     scalp  . Anxiety     Past Surgical History  Procedure Laterality Date  . Rotator cuff repair    . Lumbar disc surgery         The following portions of the patient's history were reviewed and updated as appropriate: Allergies, current medications, and problem list.    Review of Systems:   12 Pt  review of systems was negative except those addressed in the HPI,     History   Social History  . Marital Status: Married    Spouse Name: N/A    Number of Children: N/A  . Years of Education: N/A   Occupational History  . Not on file.   Social History Main  Topics  . Smoking status: Former Smoker -- 0.40 packs/day    Types: Cigarettes    Quit date: 03/21/2010  . Smokeless tobacco: Never Used  . Alcohol Use: Yes     Comment: occasional  . Drug Use: No  . Sexually Active: Not on file   Other Topics Concern  . Not on file   Social History Narrative  . No narrative on file    Objective:  BP 116/78  Pulse 60  Temp(Src) 98.4 F (36.9 C) (Oral)  Resp 16  SpO2 97%  General appearance: alert, cooperative and appears stated age Ears: normal TM's and external ear canals both ears Throat: lips, mucosa, and tongue normal; teeth and gums normal Neck: no adenopathy, no carotid bruit, supple, symmetrical, trachea midline and thyroid not enlarged, symmetric, no tenderness/mass/nodules Back: symmetric, no curvature. ROM normal. No CVA tenderness. Lungs: clear to auscultation bilaterally Heart: regular rate and rhythm, S1, S2 normal, no murmur, click, rub or gallop Abdomen: soft, non-tender; bowel sounds normal; no masses,  no organomegaly Pulses: 2+ and symmetric Skin: Skin color, texture, turgor normal. No rashes or lesions Lymph nodes: Cervical, supraclavicular, and axillary nodes normal.  Assessment and Plan:  History of acute pancreatitis Idiopathic,  After GI evaluation which included ultrasound , CT and finally MRCP which failed to show a stricture or gallstones. Stelazine is reported to cause cholestatic jaundice but not pancreatitis.  Repeat lipase and LFTs today  Depression She is under the care of a pyschiatric NP and is requesting psychology referral .  Referral to Eval[facility]   Updated Medication List Outpatient Encounter Prescriptions as of 07/10/2012  Medication Sig Dispense Refill  . albuterol (PROVENTIL HFA;VENTOLIN HFA) 108 (90 BASE) MCG/ACT inhaler Inhale 2 puffs into the lungs every 6 (six) hours as needed.      . baclofen (LIORESAL) 10 MG tablet Take 1 tablet (10 mg total) by mouth as needed.  30 each  6  . clonazePAM  (KLONOPIN) 1 MG tablet Take 1 tablet (1 mg total) by mouth 2 (two) times daily as needed.  60 tablet  0  . Escitalopram Oxalate (LEXAPRO PO) Take 30 mg by mouth daily.       . furosemide (LASIX) 20 MG tablet Take 1 tablet (20 mg total) by mouth 2 (two) times daily.  30 tablet  6  . levothyroxine (SYNTHROID, LEVOTHROID) 88 MCG tablet TAKE 1 TABLET (88 MCG TOTAL) BY MOUTH DAILY.  30 tablet  8  . Nebivolol HCl (BYSTOLIC PO) Take 2.5 mg by mouth daily.       [name] trifluoperazine (STELAZINE) 1 MG tablet Take 1 mg by mouth 3 (three) times daily.      . ciprofloxacin (CIPRO) 250 MG tablet Take 250 mg by mouth 2 (two) times daily.      . cyanocobalamin (,VITAMIN B-12,) 1000 MCG/ML injection Inject 1 ml into muscle monthly  10 mL  1  . Diclofenac Sodium 3 % GEL Place onto the skin. Diclofenac,baclofen, cyclobenzaprine, lidocaine       No facility-administered encounter medications on file as of 07/10/2012.     Orders Placed This Encounter  Procedures  . TSH  . Lipase  . Comprehensive metabolic panel  . Vitamin B12  . Methylmalonic Acid  . Folate RBC  . Ferritin  . Iron and TIBC  . Ambulatory referral to Psychology    No Follow-up on file.

## 2012-07-10 NOTE — Patient Instructions (Addendum)
I am repeatin your liver enzymes, lipase and anemia studies   We will refer you to Dr Laymond Purser , the psychologist

## 2012-07-11 DIAGNOSIS — Z8719 Personal history of other diseases of the digestive system: Secondary | ICD-10-CM | POA: Insufficient documentation

## 2012-07-11 LAB — VITAMIN B12: Vitamin B-12: 573 pg/mL (ref 211–911)

## 2012-07-11 LAB — IRON AND TIBC: %SAT: 20 % (ref 20–55)

## 2012-07-11 LAB — COMPREHENSIVE METABOLIC PANEL
Albumin: 4.2 g/dL (ref 3.5–5.2)
BUN: 16 mg/dL (ref 6–23)
CO2: 30 mEq/L (ref 19–32)
Calcium: 10 mg/dL (ref 8.4–10.5)
GFR: 70.75 mL/min (ref >60.00)
Glucose, Bld: 89 mg/dL (ref 70–99)
Potassium: 4.4 mEq/L (ref 3.5–5.1)
Sodium: 139 mEq/L (ref 135–145)
Total Protein: 7.3 g/dL (ref 6.0–8.3)

## 2012-07-11 NOTE — Assessment & Plan Note (Signed)
She is under the care of a pyschiatric NP and is requesting psychology referral .  Referral to Eval[facility]

## 2012-07-11 NOTE — Assessment & Plan Note (Signed)
Idiopathic,  After GI evaluation which included ultrasound , CT and finally MRCP which failed to show a stricture or gallstones. Stelazine is reported to cause cholestatic jaundice but not pancreatitis.  Repeat lipase and LFTs today

## 2012-07-12 ENCOUNTER — Encounter: Payer: Self-pay | Admitting: Internal Medicine

## 2012-07-13 LAB — METHYLMALONIC ACID, SERUM: Methylmalonic Acid, Quant: 0.12 umol/L (ref <0.40)

## 2012-07-16 NOTE — Telephone Encounter (Signed)
Mailed unread message to pt with labwork

## 2012-08-07 ENCOUNTER — Ambulatory Visit (INDEPENDENT_AMBULATORY_CARE_PROVIDER_SITE_OTHER): Payer: 59 | Admitting: Psychology

## 2012-08-07 ENCOUNTER — Other Ambulatory Visit: Payer: Self-pay | Admitting: Internal Medicine

## 2012-08-07 DIAGNOSIS — F331 Major depressive disorder, recurrent, moderate: Secondary | ICD-10-CM

## 2012-08-14 ENCOUNTER — Ambulatory Visit (INDEPENDENT_AMBULATORY_CARE_PROVIDER_SITE_OTHER): Payer: 59 | Admitting: Psychology

## 2012-08-14 DIAGNOSIS — F331 Major depressive disorder, recurrent, moderate: Secondary | ICD-10-CM

## 2012-08-17 ENCOUNTER — Encounter: Payer: Self-pay | Admitting: Internal Medicine

## 2012-08-17 ENCOUNTER — Ambulatory Visit (INDEPENDENT_AMBULATORY_CARE_PROVIDER_SITE_OTHER): Payer: 59 | Admitting: Internal Medicine

## 2012-08-17 VITALS — BP 112/68 | HR 78 | Temp 97.8°F | Resp 12 | Wt 230.5 lb

## 2012-08-17 DIAGNOSIS — M545 Low back pain, unspecified: Secondary | ICD-10-CM | POA: Insufficient documentation

## 2012-08-17 MED ORDER — TIZANIDINE HCL 6 MG PO CAPS: 6.0000 mg | ORAL_CAPSULE | Freq: Three times a day (TID) | ORAL | Status: DC | PRN

## 2012-08-17 MED ORDER — DICLOFENAC SODIUM 3 % TD GEL: TRANSDERMAL | Status: DC

## 2012-08-17 MED ORDER — HYDROCODONE-ACETAMINOPHEN 5-325 MG PO TABS: 1.0000 | ORAL_TABLET | Freq: Four times a day (QID) | ORAL | Status: DC | PRN

## 2012-08-17 MED ORDER — PREDNISONE (PAK) 10 MG PO TABS: ORAL_TABLET | ORAL | Status: DC

## 2012-08-17 NOTE — Progress Notes (Signed)
Patient ID: [name], female   DOB: 1955-05-18, 57 y.o.   MRN: [id number]   Patient Active Problem List   Diagnosis Date Noted  . Lumbago 08/17/2012  . History of acute pancreatitis 07/11/2012  . Hospital discharge follow-up 06/29/2012  . Colon cancer screening 04/30/2012  . Vestibular dizziness 01/31/2012  . B12 deficiency 04/11/2011  . Fatty liver disease, nonalcoholic 04/11/2011  . Obesity, Class III, BMI 40-49.9 (morbid obesity) 04/11/2011  . Tobacco abuse   . Depression   . Hypothyroidism   . Hyperlipidemia   . Hypertension   . Arthritis   . Atopic dermatitis   . Anxiety     Subjective:  CC:   Chief Complaint  Patient presents with  . Acute Visit    Back X 3 days lower back radiating down right side.    HPI:   [name] a 57 y.o. female who presents for evaluation of back pain.  "she overdid it"  Startedbeing more active after a long period of inactivity following her episode of pancreatitis with a few hours of housework on Monday.  Started having sore muscles on Tuesday,  By thursday night back muscles were in spasm and now her pain is radiating down her right side.  She has been using heat and ice in an alternating pattern.    Past Medical History  Diagnosis Date  . Tobacco abuse   . Depression   . Thyroid disease   . Hyperlipidemia   . Hypertension   . Arthritis   . Atopic dermatitis     scalp  . Anxiety     Past Surgical History  Procedure Laterality Date  . Rotator cuff repair    . Lumbar disc surgery         The following portions of the patient's history were reviewed and updated as appropriate: Allergies, current medications, and problem list.    Review of Systems:   12 Pt  review of systems was negative except those addressed in the HPI,     History   Social History  . Marital Status: Married    Spouse Name: N/A    Number of Children: N/A  . Years of Education: N/A   Occupational History  . Not on file.   Social  History Main Topics  . Smoking status: Light Tobacco Smoker -- 0.40 packs/day    Types: Cigarettes  . Smokeless tobacco: Never Used  . Alcohol Use: Yes     Comment: occasional  . Drug Use: No  . Sexual Activity: Not on file   Other Topics Concern  . Not on file   Social History Narrative  . No narrative on file    Objective:  Filed Vitals:   08/17/12 1507  BP: 112/68  Pulse: 78  Temp: 97.8 F (36.6 C)  Resp: 12     General appearance: alert, cooperative and appears stated age Ears: normal TM's and external ear canals both ears Throat: lips, mucosa, and tongue normal; teeth and gums normal Neck: no adenopathy, no carotid bruit, supple, symmetrical, trachea midline and thyroid not enlarged, symmetric, no tenderness/mass/nodules Back: symmetric, no curvature. ROM restricted secondary to pain,  paraspinous muscles in spasm.  No CVA tenderness. Lungs: clear to auscultation bilaterally Heart: regular rate and rhythm, S1, S2 normal, no murmur, click, rub or gallop Abdomen: soft, non-tender; bowel sounds normal; no masses,  no organomegaly Pulses: 2+ and symmetric Skin: Skin color, texture, turgor normal. No rashes or lesions Lymph nodes: Cervical, supraclavicular, and axillary  nodes normal.  Assessment and Plan:  Lumbago Prednisone taper, muscle relaxer and analgesics. Recommended walking daily.  Needs to strengthen her core muscles,  Referral to PT in a few weeks.  Updated Medication List Outpatient Encounter Prescriptions as of 08/17/2012  Medication Sig Dispense Refill  . albuterol (PROVENTIL HFA;VENTOLIN HFA) 108 (90 BASE) MCG/ACT inhaler Inhale 2 puffs into the lungs every 6 (six) hours as needed.      . clonazePAM (KLONOPIN) 1 MG tablet Take 1 tablet (1 mg total) by mouth 2 (two) times daily as needed.  60 tablet  0  . Diclofenac Sodium 3 % GEL Diclofenac,baclofen, cyclobenzaprine, lidocaine gel to affected area twice daily  100 g  1  . Escitalopram Oxalate (LEXAPRO  PO) Take 30 mg by mouth daily.       . furosemide (LASIX) 20 MG tablet Take 1 tablet (20 mg total) by mouth 2 (two) times daily.  30 tablet  6  . levothyroxine (SYNTHROID, LEVOTHROID) 88 MCG tablet TAKE 1 TABLET (88 MCG TOTAL) BY MOUTH DAILY.  30 tablet  8  . Nebivolol HCl (BYSTOLIC PO) Take 2.5 mg by mouth daily.       [name] trifluoperazine (STELAZINE) 1 MG tablet Take 1 mg by mouth 3 (three) times daily.      . [DISCONTINUED] baclofen (LIORESAL) 10 MG tablet Take 1 tablet (10 mg total) by mouth as needed.  30 each  6  . [DISCONTINUED] Diclofenac Sodium 3 % GEL Place onto the skin. Diclofenac,baclofen, cyclobenzaprine, lidocaine      . HYDROcodone-acetaminophen (NORCO/VICODIN) 5-325 MG per tablet Take 1 tablet by mouth every 6 (six) hours as needed for pain.  90 tablet  3  . predniSONE (STERAPRED UNI-PAK) 10 MG tablet 6 tablets on day 1, decrease by tablet daily until gone  21 tablet  0  . tizanidine (ZANAFLEX) 6 MG capsule Take 1 capsule (6 mg total) by mouth 3 (three) times daily as needed for muscle spasms.  60 capsule  1  . [DISCONTINUED] ciprofloxacin (CIPRO) 250 MG tablet Take 250 mg by mouth 2 (two) times daily.      . [DISCONTINUED] cyanocobalamin (,VITAMIN B-12,) 1000 MCG/ML injection Inject 1 ml into muscle monthly  10 mL  1   No facility-administered encounter medications on file as of 08/17/2012.     Orders Placed This Encounter  Procedures  . Ambulatory referral to Physical Therapy    No Follow-up on file.

## 2012-08-17 NOTE — Patient Instructions (Addendum)
Your paraspinous muscles are in spasm.  They did this to protect your lumbar spine and keep it in alignment.   This should resolve with use of  tizanadine , an antispasmodic,  and a 6 day prednisone taper for the inflammation   Limit your aspirin to one daily.  You can use the vicodin for pain control .  You can use the voltaren gel on the  affected area   Avoid strenuous activity for one week.   Referral to [facility] WHEN you return from your trip

## 2012-08-19 NOTE — Assessment & Plan Note (Addendum)
Prednisone taper, muscle relaxer and analgesics. Recommended walking daily.  She is requesting refill on the diclofenac gel.

## 2012-08-29 ENCOUNTER — Ambulatory Visit: Payer: Self-pay | Admitting: Psychology

## 2012-09-18 ENCOUNTER — Ambulatory Visit (INDEPENDENT_AMBULATORY_CARE_PROVIDER_SITE_OTHER): Payer: 59 | Admitting: Psychology

## 2012-09-18 DIAGNOSIS — F331 Major depressive disorder, recurrent, moderate: Secondary | ICD-10-CM

## 2012-09-19 ENCOUNTER — Encounter: Payer: Self-pay | Admitting: Internal Medicine

## 2012-10-03 ENCOUNTER — Encounter: Payer: Self-pay | Admitting: Internal Medicine

## 2012-10-10 ENCOUNTER — Ambulatory Visit (INDEPENDENT_AMBULATORY_CARE_PROVIDER_SITE_OTHER): Payer: 59 | Admitting: Psychology

## 2012-10-10 DIAGNOSIS — F331 Major depressive disorder, recurrent, moderate: Secondary | ICD-10-CM

## 2012-10-24 ENCOUNTER — Ambulatory Visit (INDEPENDENT_AMBULATORY_CARE_PROVIDER_SITE_OTHER): Payer: 59 | Admitting: Psychology

## 2012-10-24 DIAGNOSIS — F331 Major depressive disorder, recurrent, moderate: Secondary | ICD-10-CM

## 2012-10-31 ENCOUNTER — Ambulatory Visit (INDEPENDENT_AMBULATORY_CARE_PROVIDER_SITE_OTHER): Payer: 59 | Admitting: Psychology

## 2012-10-31 DIAGNOSIS — F331 Major depressive disorder, recurrent, moderate: Secondary | ICD-10-CM

## 2012-11-03 ENCOUNTER — Encounter: Payer: Self-pay | Admitting: Internal Medicine

## 2012-11-07 ENCOUNTER — Ambulatory Visit (INDEPENDENT_AMBULATORY_CARE_PROVIDER_SITE_OTHER): Payer: 59 | Admitting: Psychology

## 2012-11-07 DIAGNOSIS — F331 Major depressive disorder, recurrent, moderate: Secondary | ICD-10-CM

## 2012-11-08 ENCOUNTER — Other Ambulatory Visit: Payer: Self-pay

## 2012-11-14 ENCOUNTER — Ambulatory Visit (INDEPENDENT_AMBULATORY_CARE_PROVIDER_SITE_OTHER): Payer: 59 | Admitting: Psychology

## 2012-11-14 DIAGNOSIS — F331 Major depressive disorder, recurrent, moderate: Secondary | ICD-10-CM

## 2012-11-21 ENCOUNTER — Ambulatory Visit (INDEPENDENT_AMBULATORY_CARE_PROVIDER_SITE_OTHER): Payer: 59 | Admitting: Psychology

## 2012-11-21 DIAGNOSIS — F331 Major depressive disorder, recurrent, moderate: Secondary | ICD-10-CM

## 2012-11-23 ENCOUNTER — Other Ambulatory Visit: Payer: Self-pay | Admitting: *Deleted

## 2012-11-23 MED ORDER — BACLOFEN 10 MG PO TABS: 10.0000 mg | ORAL_TABLET | ORAL | Status: DC | PRN

## 2012-11-23 NOTE — Telephone Encounter (Signed)
Refill, this was not on pt's current med list?

## 2012-11-23 NOTE — Telephone Encounter (Signed)
Refill Request:  Baclofen 10 mg tablet   Take one tablet by mouth every night at bedtime as needed for muscle spasm

## 2012-12-03 ENCOUNTER — Encounter: Payer: Self-pay | Admitting: Internal Medicine

## 2012-12-05 ENCOUNTER — Ambulatory Visit (INDEPENDENT_AMBULATORY_CARE_PROVIDER_SITE_OTHER): Payer: 59 | Admitting: Psychology

## 2012-12-05 DIAGNOSIS — F331 Major depressive disorder, recurrent, moderate: Secondary | ICD-10-CM

## 2012-12-11 ENCOUNTER — Other Ambulatory Visit: Payer: Self-pay | Admitting: Internal Medicine

## 2012-12-11 NOTE — Telephone Encounter (Signed)
OK refill?? 

## 2012-12-12 ENCOUNTER — Ambulatory Visit (INDEPENDENT_AMBULATORY_CARE_PROVIDER_SITE_OTHER): Payer: 59 | Admitting: Psychology

## 2012-12-12 DIAGNOSIS — F331 Major depressive disorder, recurrent, moderate: Secondary | ICD-10-CM

## 2012-12-17 ENCOUNTER — Encounter: Payer: Self-pay | Admitting: Internal Medicine

## 2012-12-17 ENCOUNTER — Ambulatory Visit (INDEPENDENT_AMBULATORY_CARE_PROVIDER_SITE_OTHER): Payer: 59 | Admitting: Internal Medicine

## 2012-12-17 VITALS — BP 100/58 | HR 67 | Temp 97.5°F | Resp 14 | Wt 221.5 lb

## 2012-12-17 DIAGNOSIS — M545 Low back pain, unspecified: Secondary | ICD-10-CM

## 2012-12-17 DIAGNOSIS — Z1211 Encounter for screening for malignant neoplasm of colon: Secondary | ICD-10-CM

## 2012-12-17 DIAGNOSIS — Z720 Tobacco use: Secondary | ICD-10-CM

## 2012-12-17 DIAGNOSIS — F17200 Nicotine dependence, unspecified, uncomplicated: Secondary | ICD-10-CM

## 2012-12-17 MED ORDER — PREDNISONE (PAK) 10 MG PO TABS: ORAL_TABLET | ORAL | Status: DC

## 2012-12-17 MED ORDER — HYDROCODONE-ACETAMINOPHEN 5-325 MG PO TABS: 1.0000 | ORAL_TABLET | Freq: Four times a day (QID) | ORAL | Status: DC | PRN

## 2012-12-17 NOTE — Progress Notes (Signed)
Patient ID: [name], female   DOB: Dec 08, 1955, 57 y.o.   MRN: [id number]  Patient Active Problem List   Diagnosis Date Noted  . Lumbago 08/17/2012  . History of acute pancreatitis 07/11/2012  . Hospital discharge follow-up 06/29/2012  . Colon cancer screening 04/30/2012  . Vestibular dizziness 01/31/2012  . B12 deficiency 04/11/2011  . Fatty liver disease, nonalcoholic 04/11/2011  . Obesity, Class III, BMI 40-49.9 (morbid obesity) 04/11/2011  . Tobacco abuse   . Depression   . Hypothyroidism   . Hyperlipidemia   . Hypertension   . Arthritis   . Atopic dermatitis   . Anxiety     Subjective:  CC:   Chief Complaint  Patient presents with  . Acute Visit  . Back Pain    lower lumbar region of back and sacral area.    HPI:   [name] a 57 y.o. female who presents with Back pain. She has been going to PT and doing the exercises even if it hurts, but finds that simple  Housework aggravates her back pain , bending over, doing laundry.  Lower lumbar pain  occurs acutely and is brought on by rising from seated position ,   Spasm.    Past Medical History  Diagnosis Date  . Tobacco abuse   . Depression   . Thyroid disease   . Hyperlipidemia   . Hypertension   . Arthritis   . Atopic dermatitis     scalp  . Anxiety     Past Surgical History  Procedure Laterality Date  . Rotator cuff repair    . Lumbar disc surgery         The following portions of the patient's history were reviewed and updated as appropriate: Allergies, current medications, and problem list.    Review of Systems:   12 Pt  review of systems was negative except those addressed in the HPI,     History   Social History  . Marital Status: Married    Spouse Name: N/A    Number of Children: N/A  . Years of Education: N/A   Occupational History  . Not on file.   Social History Main Topics  . Smoking status: Light Tobacco Smoker -- 0.40 packs/day    Types: Cigarettes  . Smokeless  tobacco: Never Used  . Alcohol Use: Yes     Comment: occasional  . Drug Use: No  . Sexual Activity: Not on file   Other Topics Concern  . Not on file   Social History Narrative  . No narrative on file    Objective:  Filed Vitals:   12/17/12 1623  BP: 100/58  Pulse: 67  Temp: 97.5 F (36.4 C)  Resp: 14     General appearance: alert, cooperative and appears stated age Neck: no adenopathy, no carotid bruit, supple, symmetrical, trachea midline and thyroid not enlarged, symmetric, no tenderness/mass/nodules Back: symmetric, no curvature. ROM normal. No CVA tenderness. Lungs: clear to auscultation bilaterally Heart: regular rate and rhythm, S1, S2 normal, no murmur, click, rub or gallop Abdomen: soft, non-tender; bowel sounds normal; no masses,  no organomegaly Pulses: 2+ and symmetric Skin: Skin color, texture, turgor normal. No rashes or lesions Lymph nodes: Cervical, supraclavicular, and axillary nodes normal. MSK:  Positive straight leg lift.  paraspinous muscle spasm   Assessment and Plan:  Lumbago Recurrent flares,  Despite PT, secondary to  housework.  Prednisone and vicodin refilled.    She has a history of Prior lumbar surgery by  Artelia Laroche 10 yrs ago,  Discussed returning to see him   Tobacco abuse Smoking cessation instruction/counseling given:  counseled patient on the dangers of tobacco use, advised patient to stop smoking, and reviewed strategies to maximize success  Obesity, Class III, BMI 40-49.9 (morbid obesity) I have addressed Body mass index is 36.86 kg/(m^2). and commended her on wt loss.   recommended a low glycemic index diet utilizing smaller more frequent meals to increase metabolism.  I have also recommended that patient start exercising with a goal of 30 minutes of aerobic exercise a minimum of 5 days per week. Screening for lipid disorders, thyroid and diabetes to be done today.     Updated Medication List Outpatient Encounter Prescriptions  as of 12/17/2012  Medication Sig  . albuterol (PROVENTIL HFA;VENTOLIN HFA) 108 (90 BASE) MCG/ACT inhaler Inhale 2 puffs into the lungs every 6 (six) hours as needed.  . clonazePAM (KLONOPIN) 1 MG tablet Take 1 tablet (1 mg total) by mouth 2 (two) times daily as needed.  . Diclofenac Sodium 3 % GEL Diclofenac,baclofen, cyclobenzaprine, lidocaine gel to affected area twice daily  . Escitalopram Oxalate (LEXAPRO PO) Take 30 mg by mouth daily.   . furosemide (LASIX) 20 MG tablet Take 1 tablet (20 mg total) by mouth 2 (two) times daily.  [name] HYDROcodone-acetaminophen (NORCO/VICODIN) 5-325 MG per tablet Take 1 tablet by mouth every 6 (six) hours as needed.  [name] levothyroxine (SYNTHROID, LEVOTHROID) 88 MCG tablet TAKE 1 TABLET (88 MCG TOTAL) BY MOUTH DAILY.  . Nebivolol HCl (BYSTOLIC PO) Take 2.5 mg by mouth daily.   . tizanidine (ZANAFLEX) 6 MG capsule TAKE 1 CAPSULE (6 MG TOTAL) BY MOUTH 3 (THREE) TIMES DAILY AS NEEDED FOR MUSCLE SPASMS.  [name] trifluoperazine (STELAZINE) 1 MG tablet Take 1 mg by mouth 3 (three) times daily.  . [DISCONTINUED] HYDROcodone-acetaminophen (NORCO/VICODIN) 5-325 MG per tablet Take 1 tablet by mouth every 6 (six) hours as needed for pain.  . baclofen (LIORESAL) 10 MG tablet Take 1 tablet (10 mg total) by mouth as needed.  . predniSONE (STERAPRED UNI-PAK) 10 MG tablet 6 tablets on day 1, decrease by tablet daily until gone  . [DISCONTINUED] predniSONE (STERAPRED UNI-PAK) 10 MG tablet 6 tablets on day 1, decrease by tablet daily until gone     Orders Placed This Encounter  Procedures  . Ambulatory referral to Gastroenterology  . HM COLONOSCOPY    No Follow-up on file.

## 2012-12-17 NOTE — Assessment & Plan Note (Addendum)
Recurrent flares,  Despite PT, secondary to  housework.  Prednisone and vicodin refilled.    She has a history of Prior lumbar surgery by Artelia Laroche 10 yrs ago,  Discussed returning to see him

## 2012-12-17 NOTE — Patient Instructions (Signed)
I am prescribing a prednisone taper to help your back spasms  If your pain does not improve, I suggest seeing Dr Waymon Budge again  Return in January for fasting labs,  Cortisol level and TDAP vaccine  You have been referred for colonoscopy to Dr Mechele Collin   CONGRATULATIONS ON THE WT LOSS 26 LBS SO FAR !!

## 2012-12-17 NOTE — Progress Notes (Signed)
Pre-visit discussion using our clinic review tool. No additional management support is needed unless otherwise documented below in the visit note.  

## 2012-12-18 ENCOUNTER — Encounter: Payer: Self-pay | Admitting: Internal Medicine

## 2012-12-18 NOTE — Assessment & Plan Note (Signed)
Smoking cessation instruction/counseling given:  counseled patient on the dangers of tobacco use, advised patient to stop smoking, and reviewed strategies to maximize success 

## 2012-12-18 NOTE — Assessment & Plan Note (Signed)
I have addressed Body mass index is 36.86 kg/(m^2). and commended her on wt loss.   recommended a low glycemic index diet utilizing smaller more frequent meals to increase metabolism.  I have also recommended that patient start exercising with a goal of 30 minutes of aerobic exercise a minimum of 5 days per week. Screening for lipid disorders, thyroid and diabetes to be done today.

## 2012-12-19 ENCOUNTER — Ambulatory Visit (INDEPENDENT_AMBULATORY_CARE_PROVIDER_SITE_OTHER): Payer: 59 | Admitting: Psychology

## 2012-12-19 DIAGNOSIS — F331 Major depressive disorder, recurrent, moderate: Secondary | ICD-10-CM

## 2012-12-20 ENCOUNTER — Ambulatory Visit: Payer: Self-pay

## 2012-12-25 ENCOUNTER — Ambulatory Visit: Payer: 59 | Admitting: Psychology

## 2013-01-03 ENCOUNTER — Encounter: Payer: Self-pay | Admitting: Internal Medicine

## 2013-01-09 ENCOUNTER — Ambulatory Visit (INDEPENDENT_AMBULATORY_CARE_PROVIDER_SITE_OTHER): Payer: 59 | Admitting: Psychology

## 2013-01-09 DIAGNOSIS — F331 Major depressive disorder, recurrent, moderate: Secondary | ICD-10-CM

## 2013-01-16 ENCOUNTER — Telehealth: Payer: Self-pay | Admitting: *Deleted

## 2013-01-16 ENCOUNTER — Ambulatory Visit: Payer: 59 | Admitting: Psychology

## 2013-01-16 DIAGNOSIS — R5381 Other malaise: Secondary | ICD-10-CM

## 2013-01-16 DIAGNOSIS — E785 Hyperlipidemia, unspecified: Secondary | ICD-10-CM

## 2013-01-16 DIAGNOSIS — Z79899 Other long term (current) drug therapy: Secondary | ICD-10-CM

## 2013-01-16 DIAGNOSIS — E559 Vitamin D deficiency, unspecified: Secondary | ICD-10-CM

## 2013-01-16 DIAGNOSIS — R5383 Other fatigue: Secondary | ICD-10-CM

## 2013-01-16 NOTE — Telephone Encounter (Signed)
Pt is coming in tomorrow 01.15.2015 for labs, what labs and dx?

## 2013-01-17 ENCOUNTER — Ambulatory Visit: Payer: Self-pay

## 2013-01-17 ENCOUNTER — Other Ambulatory Visit: Payer: Self-pay

## 2013-01-23 ENCOUNTER — Ambulatory Visit (INDEPENDENT_AMBULATORY_CARE_PROVIDER_SITE_OTHER): Payer: 59 | Admitting: Psychology

## 2013-01-23 DIAGNOSIS — F331 Major depressive disorder, recurrent, moderate: Secondary | ICD-10-CM

## 2013-01-30 ENCOUNTER — Ambulatory Visit (INDEPENDENT_AMBULATORY_CARE_PROVIDER_SITE_OTHER): Payer: 59 | Admitting: Psychology

## 2013-01-30 DIAGNOSIS — F331 Major depressive disorder, recurrent, moderate: Secondary | ICD-10-CM

## 2013-02-06 ENCOUNTER — Ambulatory Visit (INDEPENDENT_AMBULATORY_CARE_PROVIDER_SITE_OTHER): Payer: 59 | Admitting: Psychology

## 2013-02-06 DIAGNOSIS — F331 Major depressive disorder, recurrent, moderate: Secondary | ICD-10-CM

## 2013-02-13 ENCOUNTER — Ambulatory Visit (INDEPENDENT_AMBULATORY_CARE_PROVIDER_SITE_OTHER): Payer: 59 | Admitting: Psychology

## 2013-02-13 DIAGNOSIS — F331 Major depressive disorder, recurrent, moderate: Secondary | ICD-10-CM

## 2013-02-20 ENCOUNTER — Ambulatory Visit (INDEPENDENT_AMBULATORY_CARE_PROVIDER_SITE_OTHER): Payer: 59 | Admitting: Psychology

## 2013-02-20 DIAGNOSIS — F331 Major depressive disorder, recurrent, moderate: Secondary | ICD-10-CM

## 2013-02-27 ENCOUNTER — Ambulatory Visit: Payer: 59 | Admitting: Psychology

## 2013-03-06 ENCOUNTER — Ambulatory Visit (INDEPENDENT_AMBULATORY_CARE_PROVIDER_SITE_OTHER): Payer: 59 | Admitting: Psychology

## 2013-03-06 ENCOUNTER — Telehealth: Payer: Self-pay | Admitting: *Deleted

## 2013-03-06 DIAGNOSIS — F331 Major depressive disorder, recurrent, moderate: Secondary | ICD-10-CM

## 2013-03-06 NOTE — Telephone Encounter (Signed)
Patient Information:  Caller Name: [name]  Phone: [phone]  Patient: [name], Leahanna  Gender: Female  DOB: 06/26/1955  Age: [age] Years  PCP: Duncan Dullullo, Teresa (Adults only)  Office Follow Up:  Does the office need to follow up with this patient?: No  Instructions For The Office: N/A  RN Note:  Patient calling regarding nausea, dizziness and right sided pain.  Describes as "like how I felt with Pancreatitis." Advised to go to [facility] for evaluation.  Symptoms  Reason For Call & Symptoms: c/o nausea dizziness, right sided pain  Reviewed Health History In EMR: Yes  Reviewed Medications In EMR: Yes  Reviewed Allergies In EMR: Yes  Reviewed Surgeries / Procedures: Yes  Date of Onset of Symptoms: 03/04/2013  Guideline(s) Used:  Abdominal Pain - Female  Abdominal Pain - Upper  Disposition Per Guideline:   Go to ED Now  Reason For Disposition Reached:   Pain lasting > 10 minutes and over 60 years old  Advice Given:  N/A  Patient Will Follow Care Advice:  YES

## 2013-03-06 NOTE — Telephone Encounter (Signed)
Call a nurse called patient with complaint of severe abdominal pain and stated much like when she has had pancreatitis before, wanted to verify ED disposition, agreed patient should go to ED since having severe pain and nausea no diarrhea noted. FYI

## 2013-03-08 ENCOUNTER — Emergency Department: Payer: Self-pay

## 2013-03-08 LAB — URINALYSIS, COMPLETE
Bilirubin,UR: NEGATIVE
GLUCOSE, UR: NEGATIVE mg/dL (ref 0–75)
Ketone: NEGATIVE
Nitrite: NEGATIVE
Ph: 7 (ref 4.5–8.0)
Protein: NEGATIVE
RBC,UR: 7 /HPF (ref 0–5)
Specific Gravity: 1.003 (ref 1.003–1.030)
WBC UR: 71 /HPF (ref 0–5)

## 2013-03-08 LAB — COMPREHENSIVE METABOLIC PANEL
ALK PHOS: 46 U/L
AST: 28 U/L (ref 15–37)
Albumin: 3.7 g/dL (ref 3.4–5.0)
Anion Gap: 5 — ABNORMAL LOW (ref 7–16)
BILIRUBIN TOTAL: 0.3 mg/dL (ref 0.2–1.0)
BUN: 15 mg/dL (ref 7–18)
CALCIUM: 8.9 mg/dL (ref 8.5–10.1)
CREATININE: 0.8 mg/dL (ref 0.60–1.30)
Chloride: 108 mmol/L — ABNORMAL HIGH (ref 98–107)
Co2: 26 mmol/L (ref 21–32)
EGFR (African American): >60
Glucose: 91 mg/dL (ref 65–99)
OSMOLALITY: 278 (ref 275–301)
Potassium: 4.1 mmol/L (ref 3.5–5.1)
SGPT (ALT): 33 U/L (ref 12–78)
Sodium: 139 mmol/L (ref 136–145)
TOTAL PROTEIN: 7.1 g/dL (ref 6.4–8.2)

## 2013-03-08 LAB — CBC WITH DIFFERENTIAL/PLATELET
Basophil #: 0.1 10*3/uL (ref 0.0–0.1)
Basophil %: 1.1 %
Eosinophil #: 0.2 10*3/uL (ref 0.0–0.7)
Eosinophil %: 2.3 %
HCT: 41.6 % (ref 35.0–47.0)
HGB: 14.1 g/dL (ref 12.0–16.0)
Lymphocyte #: 3.3 10*3/uL (ref 1.0–3.6)
Lymphocyte %: 43.6 %
MCH: 31 pg (ref 26.0–34.0)
MCHC: 33.8 g/dL (ref 32.0–36.0)
MCV: 92 fL (ref 80–100)
MONO ABS: 0.5 x10 3/mm (ref 0.2–0.9)
Monocyte %: 6.7 %
NEUTROS ABS: 3.5 10*3/uL (ref 1.4–6.5)
Neutrophil %: 46.3 %
PLATELETS: 235 10*3/uL (ref 150–440)
RBC: 4.54 10*6/uL (ref 3.80–5.20)
RDW: 13.3 % (ref 11.5–14.5)
WBC: 7.5 10*3/uL (ref 3.6–11.0)

## 2013-03-08 LAB — TROPONIN I: Troponin-I: <0.02 ng/mL

## 2013-03-08 LAB — LIPASE, BLOOD: Lipase: 514 U/L — ABNORMAL HIGH (ref 73–393)

## 2013-03-11 ENCOUNTER — Telehealth: Payer: Self-pay | Admitting: Internal Medicine

## 2013-03-11 NOTE — Telephone Encounter (Signed)
Patient Information:  Caller Name: [name]  Phone: [phone]  Patient: [name], [name]  Gender: Female  DOB: 04/20/1955  Age: [age] Years  PCP: Duncan Dullullo, Teresa (Adults only)  Office Follow Up:  Does the office need to follow up with this patient?: Yes  Instructions For The Office: Patients speech is slight slurred . She has taken oxycodone for pain. Rates pain 4/10 after medication.  Patient is asking for appt in office today. Scheduled is booked. PLEASE CONTACT.  RN Note:  Patients speech is slight slurred . She has taken oxycodone for pain. Rates pain 4/10 after medication.  Patient is asking for appt in office today. Scheduled is booked. PLEASE CONTACT.  Symptoms  Reason For Call & Symptoms: Patient seen at [facility] on Friday 03/08/13.  Diagnosed with Pancreatitis and UTI.  She is on clear liquids and antibiotics. (1)  She is calling for follow up appt.  (2) Patient complains of ongoing discomfort, RUQ pain. +nausea.  (3)  She states the cipro for UTI is making her itch, but no rash (4) Lower leg pain  Reviewed Health History In EMR: Yes  Reviewed Medications In EMR: Yes  Reviewed Allergies In EMR: Yes  Reviewed Surgeries / Procedures: Yes  Date of Onset of Symptoms: 03/08/2013  Treatments Tried: clear liquids, antibiotic-Cipro  , Hydrocodone  Treatments Tried Worked: No  Any Fever: Yes  Fever Taken: Tactile  Fever Time Of Reading: 09:00:00  Fever Last Reading: N/A  Guideline(s) Used:  Abdominal Pain - Upper  Disposition Per Guideline:   See Today in Office  Reason For Disposition Reached:   Patient wants to be seen  Advice Given:  Fluids:   Sip clear fluids only (e.g., water, flat soft drinks, or half-strength fruit juice) until the pain is gone for 2 hours. Then slowly return to a regular diet.  Call Back If:  Abdominal pain is constant and present for more than 2 hours.  You become worse.  RN Overrode Recommendation:  Make Appointment  Patients speech is slight slurred  . She has taken oxycodone for pain. Rates pain 4/10 after medication.  Patient is asking for appt in office today. Scheduled is booked. PLEASE CONTACT.

## 2013-03-11 NOTE — Telephone Encounter (Signed)
Please advise 

## 2013-03-11 NOTE — Telephone Encounter (Signed)
Patient stated she will follow advice and return to ED.

## 2013-03-13 ENCOUNTER — Ambulatory Visit: Payer: 59 | Admitting: Psychology

## 2013-03-20 ENCOUNTER — Ambulatory Visit: Payer: 59 | Admitting: Psychology

## 2013-03-27 ENCOUNTER — Ambulatory Visit: Payer: 59 | Admitting: Psychology

## 2013-03-27 ENCOUNTER — Observation Stay: Payer: Self-pay | Admitting: Family Medicine

## 2013-03-27 ENCOUNTER — Ambulatory Visit: Payer: Self-pay | Admitting: Internal Medicine

## 2013-03-27 LAB — CBC WITH DIFFERENTIAL/PLATELET
BASOS PCT: 1.1 %
Basophil #: 0.1 10*3/uL (ref 0.0–0.1)
EOS PCT: 2.8 %
Eosinophil #: 0.2 10*3/uL (ref 0.0–0.7)
HCT: 42.7 % (ref 35.0–47.0)
HGB: 14.3 g/dL (ref 12.0–16.0)
Lymphocyte #: 3.2 10*3/uL (ref 1.0–3.6)
Lymphocyte %: 42.3 %
MCH: 30.8 pg (ref 26.0–34.0)
MCHC: 33.6 g/dL (ref 32.0–36.0)
MCV: 92 fL (ref 80–100)
Monocyte #: 0.5 x10 3/mm (ref 0.2–0.9)
Monocyte %: 6.7 %
NEUTROS ABS: 3.6 10*3/uL (ref 1.4–6.5)
NEUTROS PCT: 47.1 %
Platelet: 268 10*3/uL (ref 150–440)
RBC: 4.65 10*6/uL (ref 3.80–5.20)
RDW: 13.6 % (ref 11.5–14.5)
WBC: 7.7 10*3/uL (ref 3.6–11.0)

## 2013-03-27 LAB — COMPREHENSIVE METABOLIC PANEL
ALK PHOS: 47 U/L
Albumin: 3.7 g/dL (ref 3.4–5.0)
Anion Gap: 4 — ABNORMAL LOW (ref 7–16)
BILIRUBIN TOTAL: 0.3 mg/dL (ref 0.2–1.0)
BUN: 18 mg/dL (ref 7–18)
CALCIUM: 9.2 mg/dL (ref 8.5–10.1)
CREATININE: 1 mg/dL (ref 0.60–1.30)
Chloride: 107 mmol/L (ref 98–107)
Co2: 28 mmol/L (ref 21–32)
Glucose: 99 mg/dL (ref 65–99)
OSMOLALITY: 279 (ref 275–301)
Potassium: 4 mmol/L (ref 3.5–5.1)
SGOT(AST): 19 U/L (ref 15–37)
SGPT (ALT): 27 U/L (ref 12–78)
SODIUM: 139 mmol/L (ref 136–145)
Total Protein: 7.1 g/dL (ref 6.4–8.2)

## 2013-03-27 LAB — URINALYSIS, COMPLETE
BILIRUBIN, UR: NEGATIVE
BLOOD: NEGATIVE
Glucose,UR: NEGATIVE mg/dL (ref 0–75)
Hyaline Cast: 5
KETONE: NEGATIVE
Nitrite: NEGATIVE
PROTEIN: NEGATIVE
Ph: 5 (ref 4.5–8.0)
Specific Gravity: 1.01 (ref 1.003–1.030)
Squamous Epithelial: 7
Transitional Epi: 5
WBC UR: 131 /HPF (ref 0–5)

## 2013-03-27 LAB — TSH: THYROID STIMULATING HORM: 2.12 u[IU]/mL

## 2013-03-27 LAB — LIPASE, BLOOD: Lipase: 333 U/L (ref 73–393)

## 2013-03-27 LAB — TROPONIN I

## 2013-03-28 ENCOUNTER — Telehealth: Payer: Self-pay | Admitting: Internal Medicine

## 2013-03-28 LAB — CBC WITH DIFFERENTIAL/PLATELET
Basophil #: 0.1 10*3/uL (ref 0.0–0.1)
Basophil %: 1.2 %
Eosinophil #: 0.2 10*3/uL (ref 0.0–0.7)
Eosinophil %: 3.4 %
HCT: 37.7 % (ref 35.0–47.0)
HGB: 12.5 g/dL (ref 12.0–16.0)
Lymphocyte #: 2.8 10*3/uL (ref 1.0–3.6)
Lymphocyte %: 42.3 %
MCH: 30.9 pg (ref 26.0–34.0)
MCHC: 33.3 g/dL (ref 32.0–36.0)
MCV: 93 fL (ref 80–100)
MONO ABS: 0.5 x10 3/mm (ref 0.2–0.9)
Monocyte %: 7 %
NEUTROS ABS: 3 10*3/uL (ref 1.4–6.5)
Neutrophil %: 46.1 %
PLATELETS: 225 10*3/uL (ref 150–440)
RBC: 4.06 10*6/uL (ref 3.80–5.20)
RDW: 13.6 % (ref 11.5–14.5)
WBC: 6.5 10*3/uL (ref 3.6–11.0)

## 2013-03-28 LAB — COMPREHENSIVE METABOLIC PANEL
ALK PHOS: 37 U/L — AB
Albumin: 2.7 g/dL — ABNORMAL LOW (ref 3.4–5.0)
Anion Gap: 5 — ABNORMAL LOW (ref 7–16)
BUN: 14 mg/dL (ref 7–18)
Bilirubin,Total: 0.2 mg/dL (ref 0.2–1.0)
CALCIUM: 8.1 mg/dL — AB (ref 8.5–10.1)
Chloride: 110 mmol/L — ABNORMAL HIGH (ref 98–107)
Co2: 27 mmol/L (ref 21–32)
Creatinine: 0.98 mg/dL (ref 0.60–1.30)
EGFR (African American): >60
Glucose: 79 mg/dL (ref 65–99)
OSMOLALITY: 283 (ref 275–301)
Potassium: 4.2 mmol/L (ref 3.5–5.1)
SGOT(AST): 10 U/L — ABNORMAL LOW (ref 15–37)
SGPT (ALT): 20 U/L (ref 12–78)
Sodium: 142 mmol/L (ref 136–145)
TOTAL PROTEIN: 5.5 g/dL — AB (ref 6.4–8.2)

## 2013-03-28 LAB — MAGNESIUM: MAGNESIUM: 1.9 mg/dL

## 2013-03-28 NOTE — Telephone Encounter (Signed)
Okay to schedule per Dr. Darrick Huntsmanullo MD

## 2013-03-28 NOTE — Telephone Encounter (Signed)
[facility] called to schedule 2-3 day hospital f/u.  Pt being discharged 3/26.  Only 30 min slot Monday 3/30 @ 6:00 p.m.  Okay to schedule?  Slot has been blocked; please advise.  Advised [facility] pt will be called directly to schedule.

## 2013-03-29 LAB — URINE CULTURE

## 2013-04-01 ENCOUNTER — Encounter: Payer: Self-pay | Admitting: Internal Medicine

## 2013-04-01 ENCOUNTER — Ambulatory Visit (INDEPENDENT_AMBULATORY_CARE_PROVIDER_SITE_OTHER): Payer: 59 | Admitting: Internal Medicine

## 2013-04-01 VITALS — BP 100/68 | HR 81 | Temp 97.8°F | Resp 18 | Wt 211.8 lb

## 2013-04-01 DIAGNOSIS — Z09 Encounter for follow-up examination after completed treatment for conditions other than malignant neoplasm: Secondary | ICD-10-CM

## 2013-04-01 DIAGNOSIS — E2740 Unspecified adrenocortical insufficiency: Secondary | ICD-10-CM

## 2013-04-01 DIAGNOSIS — M545 Low back pain, unspecified: Secondary | ICD-10-CM

## 2013-04-01 DIAGNOSIS — E039 Hypothyroidism, unspecified: Secondary | ICD-10-CM

## 2013-04-01 MED ORDER — LACTULOSE 20 GM/30ML PO SOLN: 30.0000 mL | Freq: Four times a day (QID) | ORAL | Status: DC | PRN

## 2013-04-01 MED ORDER — PREDNISONE 5 MG PO TABS: 5.0000 mg | ORAL_TABLET | Freq: Every day | ORAL | Status: DC

## 2013-04-01 NOTE — Patient Instructions (Addendum)
Addison's Disease Addison's disease is a glandular (endocrine) or hormonal disorder. It is also called adrenal insufficiency or hypocortisolism. It affects about 1 in 100,000 people. It can affect men and women in all age groups. This disease occurs when the adrenal glands do not make enough of the hormone cortisol. In some cases, the adrenal glands also do not make the hormone aldosterone. Without the right levels of these hormones, your body cannot maintain critical life functions. The adrenal glands are located just above the kidneys. Cortisol is a steroid hormone. Its most important job is to help the body respond to stress. Cortisol also helps the body to:  Maintain blood pressure and heart(cardiovascular) function.  Slow the immune system's response to inflammation.  Control the use of proteins, carbohydrates (sugars), and fats.  Maintain a sense of well-being. CAUSES  A lack of cortisol can happen for different reasons.   Primary adrenal insufficiency is Addison's disease. The adrenal glands do not produce enough, or any, cortisol. Usually, aldosterone is also not produced.  Secondary adrenal insufficiency. The pituitary gland may not make enough of a hormone called ACTH (adrenocorticotropin). This hormone causes the adrenal glands to produce cortisol. Usually, there is enough aldosterone. Primary Adrenal Insufficiency can be caused by:  Autoimmune disease. Your body can produce antibodies that attack its own organs (in this case, your adrenal glands). The reasons why this happens are not well understood at this time. Sometimes, other organs are also affected (the polyglandular autoimmune syndromes).  An infection of the adrenal glands. Possible causes include tuberculosis, viruses (including HIV), and fungal infections. Secondary Adrenal Insufficiency This form is much more common than the primary form. It can be traced to a lack of ACTH. Without ACTH, the adrenal glands cannot make  cortisol. Causes include:  Diseases of the pituitary gland. This is a small gland in the brain that controls many important body functions. The pituitary gland produces ACTH, among other hormones. Pituitary gland tumors, injury, or surgery can cause inadequate ACTH production, which causes inadequate cortisol production.  Medications:  Megestrol (used for cancer treatment and to stimulate appetite) and some pain medications can impair production of cortisol.  Use of cortisol medication (steroids) causes your adrenal glands to not produce cortisol. When you stop this medication, it can take time for the adrenal glands to start producing cortisol again. This can be a dangerous situation, requiring slowly reducing your cortisol medicine. SYMPTOMS  Symptoms of adrenal insufficiency normally begin slowly. Problems seen with the disease are:  Severe tiredness (fatigue).  Muscle weakness.  Loss of appetite.  Weight loss. About 50% of the time, the following symptoms occur:   Nausea, vomiting and diarrhea.  Drops in blood pressure.  Dizziness or fainting.  Darkening of the skin (with primary disease only).  Being easily angered (irritable).  Depression.  Salt craving.  Low blood sugar (hypoglycemia).  Irregular or no menstrual periods. The symptoms slowly get worse. They are often ignored until a stressful event like an illness or an accident occurs. This is called an Addisonian crisis, or acute adrenal insufficiency. Without treatment, an Addisonian crisis can cause death. Symptoms of an Addisonian crisis include:  Sudden, severe pain in the lower back, abdomen, or legs.  Severe vomiting and diarrhea.  Dehydration.  Low blood pressure.  Loss of consciousness. DIAGNOSIS  In its early stages, adrenal insufficiency can be hard to diagnose. Your caregiver will need to:  Review your medical history.  Review your symptoms, such as dark tanning of the  skin.  Perform lab  tests. Results will show if levels of cortisol are too low, and will help identify the cause. CT scan or MRI scan of the adrenal and pituitary glands may also be necessary. TREATMENT  With proper treatment, you can live a normal life with Addison's disease or adrenal insufficiency.  Missing hormones need to be replaced in order to treat Addison's disease. Cortisol is replaced with hydrocortisone tablets taken by mouth. Other forms of cortisol may also be used. Since cortisone levels normally are higher in the morning and lower in the evening, you may need different doses at different times of the day. Be sure to follow your instructions carefully.  If your body cannot maintain the right levels of salt (sodium) and fluids because of too little aldosterone, you will also be given a medication. This drug replaces aldosterone. Important points about treatment:  Any sudden (acute) illness can increase your body's need for cortisol. Surgery, or other stress on the body, can do the same. If you have Addison's disease and you are ill or having surgery, you will need an increase in hormone medication to prevent an Addisonian crisis. Untreated, an Addisonian crisis can cause death.  If you are too ill to take your medication or you cannot keep it down, you must take medicine through a shot (injection). You or someone who lives with you will need to learn how to give you this injection. The shot will take the place of hydrocortisone. If you find it necessary to give yourself injectable medication, call your caregiver right away, or go to the nearest hospital emergency room. HOME CARE INSTRUCTIONS   Always carry an identification card stating your condition in case of an emergency. The card should:  Alert emergency personnel about the need to inject 100 mg of cortisol if you are severely hurt or cannot respond.  Include your caregiver's name and phone number.  Include the name and number of your closest  relative to contact.  When traveling, carry a needle, syringe, and an injectable form of cortisol for emergencies.  Know how to increase medication during periods of stress or mild colds.  Wear a warning bracelet or neck chain to alert emergency personnel. Many companies sell medical ID products.  Take your medications as prescribed. Do not stop your medications without medical supervision.  Learn about your condition. Ask your caregiver for further resources. This is a potentially dangerous, but easily managed illness. SEEK MEDICAL CARE IF:   You have Addison's disease and you are in need of surgery.  You have Addison's disease and you have an acute illness.  You have weakness, weight loss, or other unexplained symptoms. SEEK IMMEDIATE MEDICAL CARE IF:   You have symptoms of crisis:  Sudden, severe pain in the lower back, abdomen, or legs.  Severe vomiting and diarrhea.  Dehydration.  Low blood pressure.  Loss of consciousness.  You are experiencing severe:  Infections or other illness.  Vomiting.  Diarrhea. Document Released: 12/20/2004 Document Revised: 12/07/2011 Document Reviewed: 05/13/2008 [facility] Patient Information 2014 [place] [place].   For you constipation, I suggest taking metamucil on a daily basis and using lactulose sparingly for severe constipaiton

## 2013-04-01 NOTE — Progress Notes (Signed)
Patient ID: [name], female   DOB: 01/22/1955, 58 y.o.   MRN: [id number]   Patient Active Problem List   Diagnosis Date Noted  . Adrenal insufficiency 04/01/2013  . Lumbago 08/17/2012  . History of acute pancreatitis 07/11/2012  . Hospital discharge follow-up 06/29/2012  . Colon cancer screening 04/30/2012  . Vestibular dizziness 01/31/2012  . B12 deficiency 04/11/2011  . Fatty liver disease, nonalcoholic 04/11/2011  . Obesity, Class III, BMI 40-49.9 (morbid obesity) 04/11/2011  . Tobacco abuse   . Depression   . Hypothyroidism   . Hyperlipidemia   . Hypertension   . Arthritis   . Atopic dermatitis   . Anxiety     Subjective:  CC:   Chief Complaint  Patient presents with  . Follow-up    Hospital folow up for pain in abdomin low BP.    HPI:   [name] is a 58 y.o. female who presents for  hospital follow up. Patient was admitted to [facility] from March 25 to 26  For right sided flank pain accompanied by loose stools and hypotension.  She was admitted bc of relative hypotension despite receiving  IV fluids in the ER .  She had a prior admission in June for pancreatitis, which was ruled out on this admission with lipase and CT scan,   Contrasted CT of abd and pelvis and ultrasound were negative for acute changes,  But did show diverticulosis and fatty liver. Bystolic, prescribed by her cardiologist for "valvular disorder " of unknown etiology was suspended and a morning  cortisol level was drawn prio to discharge but pending at time of discharge.  Review of records on [facility] EMR note that the am cortisol was very low at 0.7   paitient is not feeling well today .  Still having  Frequent abdominal pain,  Usually in the right flank.  Felts she may be constipated, due to use of vicodin for back pain  So she stopped the Vicodin as well as her dexedrine and is taking a daily stool softener and dulcolax.  Last BM was yeterday  She continues to have right sided  back pain radiating to  right buttock.  Was treated by me with a  6 Day prednisone dose pack and referred back to her back surgeon,  Dr. Waymon Budge,  Who did her last MRI 2 years ago,  But has not been back to see him because of her fear of his anticipated admonishment for her weight gain.  She has had no exogenous steroids since Dec 20.  However in reviewing her chart she has lost 15 lbs cine December, which appears to be involuntary. Patient is a somewhat poor historian , is lethargic today and looks to husband to answer most questions.  She endorses many symptoms of adrenal insufficiency including fatigue.,  Low blood pressure, nausea,  Etc.     Past Medical History  Diagnosis Date  . Tobacco abuse   . Depression   . Thyroid disease   . Hyperlipidemia   . Hypertension   . Arthritis   . Atopic dermatitis     scalp  . Anxiety     Past Surgical History  Procedure Laterality Date  . Rotator cuff repair    . Lumbar disc surgery         The following portions of the patient's history were reviewed and updated as appropriate: Allergies, current medications, and problem list.    Review of Systems:   Patient denies headache, fevers, malaise, unintentional  weight loss, skin rash, eye pain, sinus congestion and sinus pain, sore throat, dysphagia,  hemoptysis , cough, dyspnea, wheezing, chest pain, palpitations, orthopnea, edema, abdominal pain, nausea, melena, diarrhea, constipation, flank pain, dysuria, hematuria, urinary  Frequency, nocturia, numbness, tingling, seizures,  Focal weakness, Loss of consciousness,  Tremor, insomnia, depression, anxiety, and suicidal ideation.     History   Social History  . Marital Status: Married    Spouse Name: N/A    Number of Children: N/A  . Years of Education: N/A   Occupational History  . Not on file.   Social History Main Topics  . Smoking status: Light Tobacco Smoker -- 0.40 packs/day    Types: Cigarettes  . Smokeless tobacco: Never Used  . Alcohol Use: Yes      Comment: occasional  . Drug Use: No  . Sexual Activity: Not on file   Other Topics Concern  . Not on file   Social History Narrative  . No narrative on file    Objective:  Filed Vitals:   04/01/13 1801  BP: 100/68  Pulse: 81  Temp: 97.8 F (36.6 C)  Resp: 18     General appearance: alert, cooperative and appears stated age. Ears: normal TM's and external ear canals both ears Throat: lips, mucosa, and tongue normal; teeth and gums normal Neck: no adenopathy, no carotid bruit, supple, symmetrical, trachea midline and thyroid not enlarged, symmetric, no tenderness/mass/nodules Back: symmetric, no curvature. ROM normal. No CVA tenderness. Lungs: clear to auscultation bilaterally Heart: regular rate and rhythm, S1, S2 normal, no murmur, click, rub or gallop Abdomen: soft, non-tender; bowel sounds normal; no masses,  no organomegaly Pulses: 2+ and symmetric Skin: Skin color, texture, turgor normal. No rashes or lesions Lymph nodes: Cervical, supraclavicular, and axillary nodes normal. Psych:  Tearful,  Depressed.    Assessment and Plan:  Hospital discharge follow-up Hospitalized  for hypotension, nausea and right flank pain  Ct and ultrasound showed no acute changes.  Suspect adreanl insufficiency given low cortisol level.   Adrenal insufficiency Etiology unknown.  Last exogenous steroid was a  6 day steroid taper I gave her Dec 16 which she finished Dec 21 .  I reviewed the ER visit from March 6 for nausea and UTI,  No steroids given them.   Patient  endorses many symptoms of adrenal insufficiency and her 5 AM morning cortisol level was 0.7 mcg  On March 26th,  I will have her return in the am for repeat cortisol level along with ACTH level.  rx for 5 mg prednisone given,  Not to start until after we have repeated the cortisol level  And referral to Dr. Lafe GarinGherge for further evaluation advised,   Lumbago With persistent symptoms and prior back surgery, urged to return to  back surgeon for repeat imaging.   Obesity, Class III, BMI 40-49.9 (morbid obesity) Involuntary wt loss noted on today's visit. She is currently feeling too poorly to maintain an exercise regimen   Hypothyroidism She is due for repeat TSH,  drawn today   A total of 40 minutes was spent with patient more than half of which was spent in counseling, reviewing records from other providers and coordination of care.  Updated Medication List Outpatient Encounter Prescriptions as of 04/01/2013  Medication Sig  . albuterol (PROVENTIL HFA;VENTOLIN HFA) 108 (90 BASE) MCG/ACT inhaler Inhale 2 puffs into the lungs every 6 (six) hours as needed.  . baclofen (LIORESAL) 10 MG tablet Take 1 tablet (10 mg total) by mouth  as needed.  [name] buPROPion (WELLBUTRIN XL) 150 MG 24 hr tablet Take 1 tablet by mouth every morning.  . clonazePAM (KLONOPIN) 1 MG tablet Take 1 tablet (1 mg total) by mouth 2 (two) times daily as needed.  . Escitalopram Oxalate (LEXAPRO PO) Take 30 mg by mouth daily.   [name] HYDROcodone-acetaminophen (NORCO/VICODIN) 5-325 MG per tablet Take 1 tablet by mouth every 6 (six) hours as needed.  [name] levothyroxine (SYNTHROID, LEVOTHROID) 88 MCG tablet TAKE 1 TABLET (88 MCG TOTAL) BY MOUTH DAILY.  [name] sulfamethoxazole-trimethoprim (BACTRIM,SEPTRA) 400-80 MG per tablet Take 1 tablet by mouth 2 (two) times daily.  . tizanidine (ZANAFLEX) 6 MG capsule TAKE 1 CAPSULE (6 MG TOTAL) BY MOUTH 3 (THREE) TIMES DAILY AS NEEDED FOR MUSCLE SPASMS.  . Diclofenac Sodium 3 % GEL Diclofenac,baclofen, cyclobenzaprine, lidocaine gel to affected area twice daily  . furosemide (LASIX) 20 MG tablet Take 1 tablet (20 mg total) by mouth 2 (two) times daily.  . Lactulose 20 GM/30ML SOLN Take 30 mLs (20 g total) by mouth every 6 (six) hours as needed. For relief of constipation  . Nebivolol HCl (BYSTOLIC PO) Take 2.5 mg by mouth daily.   . predniSONE (DELTASONE) 5 MG tablet Take 1 tablet (5 mg total) by mouth daily with breakfast.  .  trifluoperazine (STELAZINE) 1 MG tablet Take 1 mg by mouth 3 (three) times daily.  . [DISCONTINUED] predniSONE (STERAPRED UNI-PAK) 10 MG tablet 6 tablets on day 1, decrease by tablet daily until gone     Orders Placed This Encounter  Procedures  . Cortisol  . ACTH  . Ambulatory referral to Endocrinology    No Follow-up on file.

## 2013-04-01 NOTE — Progress Notes (Signed)
Pre-visit discussion using our clinic review tool. No additional management support is needed unless otherwise documented below in the visit note.  

## 2013-04-01 NOTE — Assessment & Plan Note (Addendum)
Hospitalized  for hypotension, nausea and right flank pain  Ct and ultrasound showed no acute changes.  Suspect adreanl insufficiency given low cortisol level.

## 2013-04-02 ENCOUNTER — Telehealth: Payer: Self-pay | Admitting: *Deleted

## 2013-04-02 ENCOUNTER — Other Ambulatory Visit (INDEPENDENT_AMBULATORY_CARE_PROVIDER_SITE_OTHER): Payer: 59

## 2013-04-02 DIAGNOSIS — E2740 Unspecified adrenocortical insufficiency: Secondary | ICD-10-CM

## 2013-04-02 DIAGNOSIS — R5381 Other malaise: Secondary | ICD-10-CM

## 2013-04-02 DIAGNOSIS — E785 Hyperlipidemia, unspecified: Secondary | ICD-10-CM

## 2013-04-02 DIAGNOSIS — E2749 Other adrenocortical insufficiency: Secondary | ICD-10-CM

## 2013-04-02 DIAGNOSIS — R5383 Other fatigue: Secondary | ICD-10-CM

## 2013-04-02 DIAGNOSIS — Z79899 Other long term (current) drug therapy: Secondary | ICD-10-CM

## 2013-04-02 DIAGNOSIS — E559 Vitamin D deficiency, unspecified: Secondary | ICD-10-CM

## 2013-04-02 LAB — COMPREHENSIVE METABOLIC PANEL
ALK PHOS: 42 U/L (ref 39–117)
ALT: 24 U/L (ref 0–35)
AST: 18 U/L (ref 0–37)
Albumin: 4.2 g/dL (ref 3.5–5.2)
BILIRUBIN TOTAL: 0.3 mg/dL (ref 0.3–1.2)
BUN: 24 mg/dL — ABNORMAL HIGH (ref 6–23)
CO2: 26 mEq/L (ref 19–32)
Calcium: 9.8 mg/dL (ref 8.4–10.5)
Chloride: 101 mEq/L (ref 96–112)
Creatinine, Ser: 1 mg/dL (ref 0.4–1.2)
GFR: 58.74 mL/min — ABNORMAL LOW (ref >60.00)
GLUCOSE: 123 mg/dL — AB (ref 70–99)
Potassium: 4 mEq/L (ref 3.5–5.1)
Sodium: 137 mEq/L (ref 135–145)
TOTAL PROTEIN: 6.6 g/dL (ref 6.0–8.3)

## 2013-04-02 LAB — LIPID PANEL
CHOLESTEROL: 241 mg/dL — AB (ref 0–200)
HDL: 58 mg/dL (ref >39.00)
LDL Cholesterol: 156 mg/dL — ABNORMAL HIGH (ref 0–99)
TRIGLYCERIDES: 135 mg/dL (ref 0.0–149.0)
Total CHOL/HDL Ratio: 4
VLDL: 27 mg/dL (ref 0.0–40.0)

## 2013-04-02 LAB — CBC WITH DIFFERENTIAL/PLATELET
Basophils Absolute: 0 10*3/uL (ref 0.0–0.1)
Basophils Relative: 0.6 % (ref 0.0–3.0)
Eosinophils Absolute: 0.2 10*3/uL (ref 0.0–0.7)
Eosinophils Relative: 2.4 % (ref 0.0–5.0)
HEMATOCRIT: 42 % (ref 36.0–46.0)
Hemoglobin: 14 g/dL (ref 12.0–15.0)
LYMPHS ABS: 3.8 10*3/uL (ref 0.7–4.0)
Lymphocytes Relative: 47.9 % — ABNORMAL HIGH (ref 12.0–46.0)
MCHC: 33.3 g/dL (ref 30.0–36.0)
MCV: 91.1 fl (ref 78.0–100.0)
MONO ABS: 0.5 10*3/uL (ref 0.1–1.0)
Monocytes Relative: 6.9 % (ref 3.0–12.0)
Neutro Abs: 3.3 10*3/uL (ref 1.4–7.7)
Neutrophils Relative %: 42.2 % — ABNORMAL LOW (ref 43.0–77.0)
PLATELETS: 272 10*3/uL (ref 150.0–400.0)
RBC: 4.61 Mil/uL (ref 3.87–5.11)
RDW: 13.5 % (ref 11.5–14.6)
WBC: 7.9 10*3/uL (ref 4.5–10.5)

## 2013-04-02 LAB — CORTISOL: Cortisol, Plasma: 6.1 ug/dL

## 2013-04-02 LAB — VITAMIN D 25 HYDROXY (VIT D DEFICIENCY, FRACTURES): Vit D, 25-Hydroxy: 33 ng/mL (ref 30–89)

## 2013-04-02 LAB — TSH: TSH: 2.92 u[IU]/mL (ref 0.35–5.50)

## 2013-04-02 NOTE — Telephone Encounter (Signed)
Patient notified

## 2013-04-02 NOTE — Assessment & Plan Note (Signed)
With persistent symptoms and prior back surgery, urged to return to back surgeon for repeat imaging.

## 2013-04-02 NOTE — Assessment & Plan Note (Signed)
She is due for repeat TSH,  drawn today

## 2013-04-02 NOTE — Telephone Encounter (Signed)
No,  i cannot prescribe anything stronger, since I have no identifiable cause of her pain .  She can take the vicodin but needs to start the prednisone and the bowrel regimen we discussed

## 2013-04-02 NOTE — Assessment & Plan Note (Addendum)
Etiology unknown.  Last exogenous steroid was a  6 day steroid taper I gave her Dec 16 which she finished Dec 21 .  I reviewed the ER visit from March 6 for nausea and UTI,  No steroids given them.   Patient  endorses many symptoms of adrenal insufficiency and her 5 AM morning cortisol level was 0.7 mcg  On March 26th,  I will have her return in the am for repeat cortisol level along with ACTH level.  rx for 5 mg prednisone given,  Not to start until after we have repeated the cortisol level  And referral to Dr. Lafe GarinGherge for further evaluation advised,

## 2013-04-02 NOTE — Assessment & Plan Note (Signed)
Involuntary wt loss noted on today's visit. She is currently feeling too poorly to maintain an exercise regimen

## 2013-04-02 NOTE — Telephone Encounter (Signed)
Patient called before coming in for labs to state she  " I could not sleep last night the pain in my abdomen was to severe, i am taking the Vicodin but it is not helping the pain" patient wanted to know if something else would help stronger please advise.

## 2013-04-03 ENCOUNTER — Ambulatory Visit (INDEPENDENT_AMBULATORY_CARE_PROVIDER_SITE_OTHER): Payer: 59 | Admitting: Psychology

## 2013-04-03 DIAGNOSIS — F331 Major depressive disorder, recurrent, moderate: Secondary | ICD-10-CM

## 2013-04-05 LAB — ACTH: C206 ACTH: 9 pg/mL (ref 6–50)

## 2013-04-10 ENCOUNTER — Ambulatory Visit: Payer: 59 | Admitting: Psychology

## 2013-04-15 ENCOUNTER — Ambulatory Visit: Payer: Self-pay | Admitting: Unknown Physician Specialty

## 2013-04-15 LAB — HM COLONOSCOPY

## 2013-04-17 ENCOUNTER — Telehealth: Payer: Self-pay | Admitting: Internal Medicine

## 2013-04-17 ENCOUNTER — Ambulatory Visit (INDEPENDENT_AMBULATORY_CARE_PROVIDER_SITE_OTHER): Payer: 59 | Admitting: Psychology

## 2013-04-17 DIAGNOSIS — F331 Major depressive disorder, recurrent, moderate: Secondary | ICD-10-CM

## 2013-04-17 NOTE — Telephone Encounter (Signed)
Can try the lactulose,  If no imporvement needs to call GI

## 2013-04-17 NOTE — Telephone Encounter (Signed)
Patient Information:  Caller Name: [name]  Phone: [phone]  Patient: [name], [name]  Gender: Female  DOB: 10/02/1955  Age: 58 Years  PCP: Duncan Dullullo, Teresa (Adults only)  Office Follow Up:  Does the office need to follow up with this patient?: Yes  Instructions For The Office: Advised caller to contact her GI physician since she just had colonoscopy for guidance.  Advise I would forward concerns to office for review by Dr. Darrick Huntsmanullo. No appt noted in office today . Please have physician review patient concerns and advise.  RN Note:  Advised caller to contact her GI physician since she just had colonoscopy for guidance.  Advise I would forward concerns to office for review by Dr. Darrick Huntsmanullo. No appt noted in office today . Please have physician review patient concerns and advise.  Symptoms  Reason For Call & Symptoms: Patient states that she is having Right lower quadrant pain . She was seen by Dr. Darrick Huntsmanullo on 04/01/13  and given Lactulose. She stopped having the pain after doing the Miralax prep for colonoscopy.   She states she had Colonoscopy on Monday 04/15/13 . Polyp and diverticulosis was found.  She states the pain returned again today , 04/17/13. in same place. Constant dull ache.  Last BM during prep on Monday.  No pain with urination,  no odor and no  blood in urine   She is unsure if she has fever but feels like she might "have Frye little fever". No vomiting, slight nausea. + right flank discomfort.  she is currently at [facility] for psych appt . and cannot come to office.  Reviewed Health History In EMR: Yes  Reviewed Medications In EMR: Yes  Reviewed Allergies In EMR: Yes  Reviewed Surgeries / Procedures: Yes  Date of Onset of Symptoms: 04/17/2013  Any Fever: Yes  Fever Taken: Tactile  Fever Time Of Reading: 14:00:00  Fever Last Reading: N/Frye  Guideline(s) Used:  Abdominal Pain - Female  Disposition Per Guideline:   See Today in Office  Reason For Disposition Reached:   Age >  60 years  Advice Given:  Rest:  Lie down and rest until you feel better.  Fluids:  Sip clear fluids only (e.g., water, flat soft drinks or 1/2 strength fruit juice) until the pain has been gone for over 2 hours. Then slowly return to Frye regular diet.  Call Back If:  Abdominal pain is constant and present for more than 2 hours  Abdominal pains come and go and are present for more than 24 hours  You become worse.  RN Overrode Recommendation:  Document Patient  Advised caller to contact her GI physician since she just had colonoscopy for guidance.  Advise I would forward concerns to office for review by Dr. Darrick Huntsmanullo. No appt noted in office today . Please have physician review patient concerns and advise.

## 2013-04-17 NOTE — Telephone Encounter (Signed)
Patient notified

## 2013-04-17 NOTE — Telephone Encounter (Signed)
FYI

## 2013-04-17 NOTE — Telephone Encounter (Signed)
Patient called to triage line with complaint of right lower quadrant abdominal pain.  Attempted to contact patient back at  [phone]. No answer. Left message to please contact the office for assistance.  NO CONTACT.

## 2013-04-17 NOTE — Telephone Encounter (Signed)
Please advise 

## 2013-04-18 LAB — PATHOLOGY REPORT

## 2013-04-19 ENCOUNTER — Telehealth: Payer: Self-pay | Admitting: Internal Medicine

## 2013-04-19 NOTE — Telephone Encounter (Signed)
The prednisone has been prescribed because she has a tentative diagnosis of adrenal insufficiency.  Stopping it is not advised until we have confirmed the diagnosis.   has she received an appt with the endocrinologist yet?

## 2013-04-19 NOTE — Telephone Encounter (Signed)
Patient states it only occurs when she takes the prednisone, that Dr. Mechele CollinElliott stated she need s the sennakot s please advise.

## 2013-04-19 NOTE — Telephone Encounter (Signed)
Spouse left vm.  States pt is having stomach trouble that seems to be related to the prednisone prescribed by Dr. Darrick Huntsmanullo.  Asking for a call.

## 2013-04-20 NOTE — Telephone Encounter (Signed)
Left detailed message advising patient the stopping the Prednisone is not advised. Also asked for a return call regarding the endocrinology appointment.

## 2013-04-22 ENCOUNTER — Encounter: Payer: Self-pay | Admitting: Internal Medicine

## 2013-04-22 ENCOUNTER — Ambulatory Visit (INDEPENDENT_AMBULATORY_CARE_PROVIDER_SITE_OTHER): Payer: 59 | Admitting: Internal Medicine

## 2013-04-22 VITALS — BP 136/89 | HR 88 | Temp 98.3°F | Resp 16 | Wt 216.0 lb

## 2013-04-22 DIAGNOSIS — K2970 Gastritis, unspecified, without bleeding: Secondary | ICD-10-CM

## 2013-04-22 DIAGNOSIS — K5900 Constipation, unspecified: Secondary | ICD-10-CM

## 2013-04-22 DIAGNOSIS — E2749 Other adrenocortical insufficiency: Secondary | ICD-10-CM

## 2013-04-22 DIAGNOSIS — K2990 Gastroduodenitis, unspecified, without bleeding: Secondary | ICD-10-CM

## 2013-04-22 DIAGNOSIS — E2740 Unspecified adrenocortical insufficiency: Secondary | ICD-10-CM

## 2013-04-22 DIAGNOSIS — R300 Dysuria: Secondary | ICD-10-CM

## 2013-04-22 LAB — POCT URINALYSIS DIPSTICK
Bilirubin, UA: NEGATIVE
GLUCOSE UA: NEGATIVE
KETONES UA: NEGATIVE
NITRITE UA: NEGATIVE
PH UA: 5.5
Protein, UA: NEGATIVE
Spec Grav, UA: <=1.005
Urobilinogen, UA: 0.2

## 2013-04-22 MED ORDER — PANTOPRAZOLE SODIUM 40 MG PO TBEC: 40.0000 mg | DELAYED_RELEASE_TABLET | Freq: Every day | ORAL | Status: DC

## 2013-04-22 NOTE — Progress Notes (Signed)
Patient ID: [name], female   DOB: 12/13/1955, 58 y.o.   MRN: [id number]  Patient Active Problem List   Diagnosis Date Noted  . Unspecified constipation 04/22/2013  . Unspecified gastritis and gastroduodenitis without mention of hemorrhage 04/22/2013  . Adrenal insufficiency 04/01/2013  . Lumbago 08/17/2012  . History of acute pancreatitis 07/11/2012  . Hospital discharge follow-up 06/29/2012  . Colon cancer screening 04/30/2012  . Vestibular dizziness 01/31/2012  . B12 deficiency 04/11/2011  . Fatty liver disease, nonalcoholic 04/11/2011  . Obesity, Class III, BMI 40-49.9 (morbid obesity) 04/11/2011  . Tobacco abuse   . Depression   . Hypothyroidism   . Hyperlipidemia   . Hypertension   . Arthritis   . Atopic dermatitis   . Anxiety     Subjective:  CC:   Chief Complaint  Patient presents with  . Acute Visit  . Urinary Tract Infection    dysuria, right flank pain    HPI:   [name] is a 58 y.o. female who presents for Follow up on persistent Right inguinal pain and right back pain both chronic.  Bowels are not moving unless she takes laxatives .  The lactulose made her feel sick.  The 5 mg of prednisone is bothering her stomach.  She had  a colonoscopy last Monday  Which showed diverticulosis and a tiny polyp.   Dr Mechele CollinElliott suggested taking sennakot for constipation .  She has an appt with Dr Elvera Lenn[place]herghe [place] evaluation of suspected adrenal insufficiency diagnosed with an early am cortisol level during recent hospitalization for hypotension and nausea.  She has been taking 5 mg prednisone since last visit but thinks it is causing her gastritis.  Not taking a PPI> .     Past Medical History  Diagnosis Date  . Tobacco abuse   . Depression   . Thyroid disease   . Hyperlipidemia   . Hypertension   . Arthritis   . Atopic dermatitis     scalp  . Anxiety     Past Surgical History  Procedure Laterality Date  . Rotator cuff repair    . Lumbar  disc surgery         The following portions of the patient's history were reviewed and updated as appropriate: Allergies, current medications, and problem list.    Review of Systems:   Patient denies headache, fevers, malaise, unintentional weight loss, skin rash, eye pain, sinus congestion and sinus pain, sore throat, dysphagia,  hemoptysis , cough, dyspnea, wheezing, chest pain, palpitations, orthopnea, edema, abdominal pain, nausea, melena, diarrhea, constipation, flank pain, dysuria, hematuria, urinary  Frequency, nocturia, numbness, tingling, seizures,  Focal weakness, Loss of consciousness,  Tremor, insomnia, depression, anxiety, and suicidal ideation.     History   Social History  . Marital Status: Married    Spouse Name: N/A    Number of Children: N/A  . Years of Education: N/A   Occupational History  . Not on file.   Social History Main Topics  . Smoking status: Light Tobacco Smoker -- 0.40 packs/day    Types: Cigarettes  . Smokeless tobacco: Never Used  . Alcohol Use: Yes     Comment: occasional  . Drug Use: No  . Sexual Activity: Not on file   Other Topics Concern  . Not on file   Social History Narrative  . No narrative on file    Objective:  Filed Vitals:   04/22/13 1813  BP: 136/89  Pulse: 88  Temp: 98.3  F (36.8 C)  Resp: 16     General appearance: alert, cooperative and appears stated age Ears: normal TM's and external ear canals both ears Throat: lips, mucosa, and tongue normal; teeth and gums normal Neck: no adenopathy, no carotid bruit, supple, symmetrical, trachea midline and thyroid not enlarged, symmetric, no tenderness/mass/nodules Back: symmetric, no curvature. ROM normal. No CVA tenderness. Lungs: clear to auscultation bilaterally Heart: regular rate and rhythm, S1, S2 normal, no murmur, click, rub or gallop Abdomen: soft, non-tender; bowel sounds normal; no masses,  no organomegaly Pulses: 2+ and symmetric Skin: Skin color,  texture, turgor normal. No rashes or lesions Lymph nodes: Cervical, supraclavicular, and axillary nodes normal.  Assessment and Plan:  Adrenal insufficiency Suggested by AM cortisol of 0.7 mcg drawn during [facility] admission for hypotension and abd pain with normal CT March 26 .  Has been taking prednisone 5 mg daily since last visit,.  endocrine referral made and appt is this Friday .  Adding PPI therapy with protonix 40 mg daily.   Unspecified constipation Trial of amitiza 8 mcg bid. For IBS.  Samples given. Recent colonoscopy showed mild diverticulosis    Updated Medication List Outpatient Encounter Prescriptions as of 04/22/2013  Medication Sig  . albuterol (PROVENTIL HFA;VENTOLIN HFA) 108 (90 BASE) MCG/ACT inhaler Inhale 2 puffs into the lungs every 6 (six) hours as needed.  . baclofen (LIORESAL) 10 MG tablet Take 1 tablet (10 mg total) by mouth as needed.  [name]. buPROPion (WELLBUTRIN XL) 150 MG 24 hr tablet Take 1 tablet by mouth every morning.  . clonazePAM (KLONOPIN) 1 MG tablet Take 1 tablet (1 mg total) by mouth 2 (two) times daily as needed.  . Diclofenac Sodium 3 % GEL Diclofenac,baclofen, cyclobenzaprine, lidocaine gel to affected area twice daily  . Escitalopram Oxalate (LEXAPRO PO) Take 30 mg by mouth daily.   . furosemide (LASIX) 20 MG tablet Take 1 tablet (20 mg total) by mouth 2 (two) times daily.  [name]. HYDROcodone-acetaminophen (NORCO/VICODIN) 5-325 MG per tablet Take 1 tablet by mouth every 6 (six) hours as needed.  . Lactulose 20 GM/30ML SOLN Take 30 mLs (20 g total) by mouth every 6 (six) hours as needed. For relief of constipation  . levothyroxine (SYNTHROID, LEVOTHROID) 88 MCG tablet TAKE 1 TABLET (88 MCG TOTAL) BY MOUTH DAILY.  [name]. predniSONE (DELTASONE) 5 MG tablet Take 1 tablet (5 mg total) by mouth daily with breakfast.  . tizanidine (ZANAFLEX) 6 MG capsule TAKE 1 CAPSULE (6 MG TOTAL) BY MOUTH 3 (THREE) TIMES DAILY AS NEEDED FOR MUSCLE SPASMS.  [name]. trifluoperazine (STELAZINE) 1  MG tablet Take 1 mg by mouth 3 (three) times daily.  . Nebivolol HCl (BYSTOLIC PO) Take 2.5 mg by mouth daily.   . pantoprazole (PROTONIX) 40 MG tablet Take 1 tablet (40 mg total) by mouth daily.  [name]. sulfamethoxazole-trimethoprim (BACTRIM,SEPTRA) 400-80 MG per tablet Take 1 tablet by mouth 2 (two) times daily.     Orders Placed This Encounter  Procedures  . POCT Urinalysis Dipstick    No Follow-up on file.

## 2013-04-22 NOTE — Patient Instructions (Addendum)
I am giving you a trial of amitiza 8 mcg twice daily for your constipation.    If it helps I will send in a prescription to your pharmacy  It treats constipation due to IBS  I will send an rx for protonix for your stomach

## 2013-04-22 NOTE — Progress Notes (Signed)
Pre-visit discussion using our clinic review tool. No additional management support is needed unless otherwise documented below in the visit note.  

## 2013-04-23 NOTE — Assessment & Plan Note (Signed)
Suggested by AM cortisol of 0.7 mcg drawn during [facility] admission for hypotension and abd pain with normal CT March 26 .  Has been taking prednisone 5 mg daily since last visit,.  endocrine referral made and appt is this Friday

## 2013-04-23 NOTE — Assessment & Plan Note (Signed)
Trial of amitiza 8 mcg bid. For IBS.  Samples given. Recent colonoscopy showed mild diverticulosis

## 2013-04-23 NOTE — Addendum Note (Signed)
Addended by: Montine CircleMALDONADO, Fielding Mault D on: 04/23/2013 01:59 PM   Modules accepted: Orders

## 2013-04-24 ENCOUNTER — Emergency Department: Payer: Self-pay | Admitting: Emergency Medicine

## 2013-04-24 ENCOUNTER — Ambulatory Visit: Payer: 59 | Admitting: Psychology

## 2013-04-24 LAB — BASIC METABOLIC PANEL
Anion Gap: 5 — ABNORMAL LOW (ref 7–16)
BUN: 13 mg/dL (ref 7–18)
CALCIUM: 8.8 mg/dL (ref 8.5–10.1)
CHLORIDE: 109 mmol/L — AB (ref 98–107)
CREATININE: 0.72 mg/dL (ref 0.60–1.30)
Co2: 27 mmol/L (ref 21–32)
EGFR (African American): >60
EGFR (Non-African Amer.): >60
GLUCOSE: 102 mg/dL — AB (ref 65–99)
Osmolality: 282 (ref 275–301)
POTASSIUM: 4.2 mmol/L (ref 3.5–5.1)
Sodium: 141 mmol/L (ref 136–145)

## 2013-04-24 LAB — URINALYSIS, COMPLETE
BACTERIA: NONE SEEN
BILIRUBIN, UR: NEGATIVE
Blood: NEGATIVE
Glucose,UR: NEGATIVE mg/dL (ref 0–75)
KETONE: NEGATIVE
Nitrite: NEGATIVE
Ph: 7 (ref 4.5–8.0)
Protein: NEGATIVE
RBC, UR: NONE SEEN /HPF (ref 0–5)
Specific Gravity: 1.011 (ref 1.003–1.030)
Squamous Epithelial: 1

## 2013-04-24 LAB — CBC
HCT: 40.3 % (ref 35.0–47.0)
HGB: 13.3 g/dL (ref 12.0–16.0)
MCH: 30.3 pg (ref 26.0–34.0)
MCHC: 33.1 g/dL (ref 32.0–36.0)
MCV: 92 fL (ref 80–100)
PLATELETS: 277 10*3/uL (ref 150–440)
RBC: 4.41 10*6/uL (ref 3.80–5.20)
RDW: 13.7 % (ref 11.5–14.5)
WBC: 8 10*3/uL (ref 3.6–11.0)

## 2013-04-24 LAB — TROPONIN I

## 2013-04-25 LAB — URINE CULTURE

## 2013-04-26 ENCOUNTER — Encounter: Payer: Self-pay | Admitting: Internal Medicine

## 2013-04-26 ENCOUNTER — Ambulatory Visit (INDEPENDENT_AMBULATORY_CARE_PROVIDER_SITE_OTHER): Payer: 59 | Admitting: Internal Medicine

## 2013-04-26 VITALS — BP 124/68 | HR 111 | Temp 98.4°F | Resp 12 | Ht 64.0 in | Wt 220.0 lb

## 2013-04-26 DIAGNOSIS — E039 Hypothyroidism, unspecified: Secondary | ICD-10-CM

## 2013-04-26 DIAGNOSIS — Z09 Encounter for follow-up examination after completed treatment for conditions other than malignant neoplasm: Secondary | ICD-10-CM

## 2013-04-26 DIAGNOSIS — E2749 Other adrenocortical insufficiency: Secondary | ICD-10-CM

## 2013-04-26 DIAGNOSIS — M545 Low back pain, unspecified: Secondary | ICD-10-CM

## 2013-04-26 DIAGNOSIS — E2740 Unspecified adrenocortical insufficiency: Secondary | ICD-10-CM

## 2013-04-26 MED ORDER — HYDROCORTISONE 5 MG PO TABS: ORAL_TABLET | ORAL | Status: DC

## 2013-04-26 NOTE — Progress Notes (Addendum)
Patient ID: [name], female   DOB: 07/22/1955, 58 y.o.   MRN: [id number]   HPI  Renee [name] is a 58 y.o.-year-old female, referred by her PCP, Dr. Darrick Huntsmanullo, in consultation for adrenal insufficiency. She is here with her husband who offers part of the history.  Pt. has been dx with adrenal insufficiency at her last hospitalization at [facility], when she presented with hypotension, R flank pain and diarrhea. An am cortisol was checked and this was 0.7 (of note, she did receive dilaudid during the hospitalization).    She was seen by PCP on 04/02/2013, and she was still c/o fatigue,weight loss (15 lbs) and was found to have a low-normal BP: 100/68, P 81. A cortisol was checked at 9 am: 6.5, and an ACTH was 9. She was started on Prednisone: 5 mg >> feels a little more energertic.  Of note, pt has a h/o back pain and had surgery 2003-2004 >> feels like she never recovered after the surgery. Remembers her BP was very low after the Sx. Her pain is better after the Sx., but still hurts. She takes Norco, but not all the time (might go 1 mo w/o them), but has weeks when the back pain is more severe and she takes it consistently then. She also has Oxycodone on her med list, but she does not like taking it >> itching.  Pt was on oral steroid pain for back pain, but not since 12/2012. She has had 3-4 join steroid injections up to 3 years ago.   Mostly in the last 2 years, pt c/o: - lack of energy, fatigue - "I am too tired to even take a shower" - brain fog - problems concentrating - cannot even drive - she had depression, but she does not feel she is depressed anymore - + palpitations (has had tachycardia for a long time) - she had weight loss on a liquid diet, then gained them back - + constipation - + dry skin - no hair falling - + easy bruising - feeling cold  - no new stretch marks  2 days ago: chest pressure, dizziness, feeling strange >> went to [facility] >> R/o AMI, but found she had a UTI.   No FH  of adrenal insufficiency or pituitary problems.  She has had acute pancreatitis 06/2012.   ROS: Constitutional: see HPI Eyes: + blurry vision, no xerophthalmia ENT: + sore throat, no nodules palpated in throat, no dysphagia/odynophagia, + hoarseness Cardiovascular: + all: CP/SOB/palpitations/leg swelling Respiratory: no cough/+ SOB Gastrointestinal: no N/V/D/+ C/+ heartburn Musculoskeletal: + muscle aches/no joint aches Skin: no rashes, + itching, + easy bruising Neurological: no tremors/numbness/tingling/dizziness, + HA Psychiatric: + both: depression/anxiety  Past Medical History  Diagnosis Date  . Tobacco abuse   . Depression   . Thyroid disease   . Hyperlipidemia   . Hypertension   . Arthritis   . Atopic dermatitis     scalp  . Anxiety    Past Surgical History  Procedure Laterality Date  . Rotator cuff repair    . Lumbar disc surgery     History   Social History  . Marital Status: Married    Spouse Name: N/A    Number of Children: 2   Occupational History  . Not working   Social History Main Topics  . Smoking status: Light Tobacco Smoker -- 0.40 packs/day    Types: Cigarettes  . Smokeless tobacco: Never Used  . Alcohol Use: Yes     Comment: occasional  .  Drug Use: No   Current Outpatient Prescriptions on File Prior to Visit  Medication Sig Dispense Refill  . baclofen (LIORESAL) 10 MG tablet Take 1 tablet (10 mg total) by mouth as needed.  30 each  6  . buPROPion (WELLBUTRIN XL) 150 MG 24 hr tablet Take 1 tablet by mouth every morning.      . clonazePAM (KLONOPIN) 1 MG tablet Take 1 tablet (1 mg total) by mouth 2 (two) times daily as needed.  60 tablet  0  . Escitalopram Oxalate (LEXAPRO PO) Take 30 mg by mouth daily.       [name] HYDROcodone-acetaminophen (NORCO/VICODIN) 5-325 MG per tablet Take 1 tablet by mouth every 6 (six) hours as needed.  90 tablet  0  . levothyroxine (SYNTHROID, LEVOTHROID) 88 MCG tablet TAKE 1 TABLET (88 MCG TOTAL) BY MOUTH DAILY.  30  tablet  8  . pantoprazole (PROTONIX) 40 MG tablet Take 1 tablet (40 mg total) by mouth daily.  30 tablet  1  . predniSONE (DELTASONE) 5 MG tablet Take 1 tablet (5 mg total) by mouth daily with breakfast.  30 tablet  1  . tizanidine (ZANAFLEX) 6 MG capsule TAKE 1 CAPSULE (6 MG TOTAL) BY MOUTH 3 (THREE) TIMES DAILY AS NEEDED FOR MUSCLE SPASMS.  60 capsule  1  . albuterol (PROVENTIL HFA;VENTOLIN HFA) 108 (90 BASE) MCG/ACT inhaler Inhale 2 puffs into the lungs every 6 (six) hours as needed.      . Diclofenac Sodium 3 % GEL Diclofenac,baclofen, cyclobenzaprine, lidocaine gel to affected area twice daily  100 g  1  . furosemide (LASIX) 20 MG tablet Take 1 tablet (20 mg total) by mouth 2 (two) times daily.  30 tablet  6  . Lactulose 20 GM/30ML SOLN Take 30 mLs (20 g total) by mouth every 6 (six) hours as needed. For relief of constipation  240 mL  2  . Nebivolol HCl (BYSTOLIC PO) Take 2.5 mg by mouth daily.       [name] sulfamethoxazole-trimethoprim (BACTRIM,SEPTRA) 400-80 MG per tablet Take 1 tablet by mouth 2 (two) times daily.      [name] trifluoperazine (STELAZINE) 1 MG tablet Take 1 mg by mouth 3 (three) times daily.       No current facility-administered medications on file prior to visit.   Allergies  Allergen Reactions  . Ancef [Cefazolin Sodium] Anaphylaxis  . Penicillins Anaphylaxis  . Clarithromycin Itching  . Eggs Or Egg-Derived Products Itching   Family History  Problem Relation Age of Onset  . Arthritis Mother   . Heart disease Father    PE: BP 124/68  Pulse 111  Temp(Src) 98.4 F (36.9 C) (Oral)  Resp 12  Ht 5\' 4"  (1.626 m)  Wt 220 lb (99.791 kg)  BMI 37.74 kg/m2  SpO2 97% Wt Readings from Last 3 Encounters:  04/26/13 220 lb (99.791 kg)  04/22/13 216 lb (97.977 kg)  04/01/13 211 lb 12 oz (96.049 kg)   Constitutional: overweight, in NAD, full supraclavicular fat pads Eyes: PERRLA, EOMI, no exophthalmos ENT: moist mucous membranes, no thyromegaly, no cervical  lymphadenopathy Cardiovascular: RRR, No MRG Respiratory: CTA B Gastrointestinal: abdomen soft, NT, ND, BS+ Musculoskeletal: no deformities, strength intact in all 4 Skin: moist, warm, no rashes; thin skin; no dark knuckle discoloration Neurological: + tremor with outstretched hands, DTR normal in all 4  ASSESSMENT: 1. 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 She tells me that she got dilaudid in the hospital, and I believe that the very low cortisol level might have been a consequence of this. However, she does have sxs that are c/w adrenal insufficiency and these were present from before the hospitalization, so for now, we need to continue her steroid replacement. I would like to switch to hydrocortisone, since this has a shorter half life, and allows the hypothalamic-pituitary-adrenal axis to recover more easily. I would like to recheck her cortisol, ACTH, and actually perform a cosyntropin stimulation test in approximately a month from now. I will add other pituitary hormone levels just to make sure that she does not have low cortisol from a possible pituitary adenoma. We discussed about the fact that her pituitary function can improve in time, if she continues to stay away from steroids and narcotics as much as she can. We will need to meet on a 6 mo basis to recheck the recovery of her pituitary-adrenal axis. However, I will see her back in 3 months for now. She agrees with the plan. - we discussed about proper replacement with Hydrocortisone >>  I explained side effects of overreplacement on many organs in the body >> we need to keep her on the minimum dose that allows her to feel well - a bid dose is likely best >> 10 mg in am and 5  mg in pm, best ~3 pm - given sick days rules Patient Instructions  Please stop Prednisone. Please start hydrocortisone 10 mg in a.m. and 5 mg in p.m. (no later than 6 PM, ideally ~2-3 pm).   - You absolutely need to take hydrocortisone every day and not skip doses. - Please double the dose if you have a fever (>100F), for the duration of the fever. - If you cannot take anything by mouth (vomiting) or you have severe diarrhea so that you eliminate the hydrocortisone pills in your stool, please make sure that you get the hydrocortisone in the vein instead - go to the nearest emergency department/urgent care or you may go to your PCPs office  - Please try to get a MedAlert bracelet or pendant indicating: "Adrenal insufficiency".  Please return in ~1 month for labs. Come fasting, at 8 am. The day before, hold the pm hydrocortisone dose and the day of the lab visit, hold the am dose of hydrocortisone. Bring the dose with you, to take immediate after we finish drawing blood for the labs. Around the time of the lab draw, please do not use any steroid meds, no steroid creams (like Anti-itch). Also, limit the use of narcotics and did not take any Norco or Oxycodone 2 days prior to the test.  Return in about 3 months (around 07/26/2013).   06/10/2013 Component     Latest Ref Rng 05/30/2013 05/30/2013 05/30/2013        11:34 AM 12:10 PM 12:45 PM  IGF1            41-279  161    TSH     0.35 - 4.50 uIU/mL 0.36    Cortisol, Plasma      8.2 18.9 22.6  C206 ACTH     6 - 50 pg/mL 5 (L)    Free T4     0.60 - 1.60 ng/dL 4.09  Prolactin      3.6    FSH      53.8    LH      36.30     Low ACTH and slightly low am cortisol. Stim test normal. Continue on hydrocortisone replacement for another 3-6 months, then recheck cortisol and ACTH at 8 am. No signs of other pituitary problems.

## 2013-04-26 NOTE — Patient Instructions (Addendum)
Please stop Prednisone. Please start hydrocortisone 10 mg in a.m. and 5 mg in p.m. (no later than 6 PM, ideally ~2-3 pm).   - You absolutely need to take hydrocortisone every day and not skip doses. - Please double the dose if you have a fever (>100F), for the duration of the fever. - If you cannot take anything by mouth (vomiting) or you have severe diarrhea so that you eliminate the hydrocortisone pills in your stool, please make sure that you get the hydrocortisone in the vein instead - go to the nearest emergency department/urgent care or you may go to your PCPs office  - Please try to get a MedAlert bracelet or pendant indicating: "Adrenal insufficiency".  Please return in ~1 month for labs. Come fasting, at 8 am. The day before, hold the pm hydrocortisone dose and the day of the lab visit, hold the am dose of hydrocortisone. Bring the dose with you, to take immediate after we finish drawing blood for the labs. Around the time of the lab draw, please d not use any steroid meds, no steroid creams (like Anti-itch). Also, limit the use of narcotics and did not take any Norco or Oxycodone 2 days prior to the test.

## 2013-05-01 ENCOUNTER — Ambulatory Visit (INDEPENDENT_AMBULATORY_CARE_PROVIDER_SITE_OTHER): Payer: 59 | Admitting: Psychology

## 2013-05-01 DIAGNOSIS — F331 Major depressive disorder, recurrent, moderate: Secondary | ICD-10-CM

## 2013-05-04 ENCOUNTER — Telehealth: Payer: Self-pay | Admitting: Internal Medicine

## 2013-05-04 DIAGNOSIS — K635 Polyp of colon: Secondary | ICD-10-CM

## 2013-05-08 ENCOUNTER — Ambulatory Visit (INDEPENDENT_AMBULATORY_CARE_PROVIDER_SITE_OTHER): Payer: 59 | Admitting: Psychology

## 2013-05-08 DIAGNOSIS — F331 Major depressive disorder, recurrent, moderate: Secondary | ICD-10-CM

## 2013-05-15 ENCOUNTER — Ambulatory Visit (INDEPENDENT_AMBULATORY_CARE_PROVIDER_SITE_OTHER): Payer: 59 | Admitting: Psychology

## 2013-05-15 DIAGNOSIS — F331 Major depressive disorder, recurrent, moderate: Secondary | ICD-10-CM

## 2013-05-22 ENCOUNTER — Ambulatory Visit (INDEPENDENT_AMBULATORY_CARE_PROVIDER_SITE_OTHER): Payer: 59 | Admitting: Psychology

## 2013-05-22 DIAGNOSIS — F331 Major depressive disorder, recurrent, moderate: Secondary | ICD-10-CM

## 2013-05-28 ENCOUNTER — Other Ambulatory Visit: Payer: Self-pay

## 2013-05-30 ENCOUNTER — Other Ambulatory Visit (INDEPENDENT_AMBULATORY_CARE_PROVIDER_SITE_OTHER): Payer: 59

## 2013-05-30 DIAGNOSIS — E2740 Unspecified adrenocortical insufficiency: Secondary | ICD-10-CM

## 2013-05-30 DIAGNOSIS — E2749 Other adrenocortical insufficiency: Secondary | ICD-10-CM

## 2013-05-30 LAB — T4, FREE: Free T4: 1.04 ng/dL (ref 0.60–1.60)

## 2013-05-30 LAB — CORTISOL
CORTISOL PLASMA: 8.2 ug/dL
CORTISOL PLASMA: 22.6 ug/dL
Cortisol, Plasma: 18.9 ug/dL

## 2013-05-30 LAB — FOLLICLE STIMULATING HORMONE: FSH: 53.8 m[IU]/mL

## 2013-05-30 LAB — LUTEINIZING HORMONE: LH: 36.3 m[IU]/mL

## 2013-05-30 LAB — TSH: TSH: 0.36 u[IU]/mL (ref 0.35–4.50)

## 2013-05-30 MED ORDER — COSYNTROPIN 0.25 MG IJ SOLR
0.2500 mg | Freq: Once | INTRAMUSCULAR | Status: AC
  Administered 2013-05-30: 0.25 mg via INTRAMUSCULAR

## 2013-05-30 NOTE — Addendum Note (Signed)
Addended by: Gayla Medicus on: 05/30/2013 12:10 PM   Modules accepted: Orders

## 2013-05-30 NOTE — Addendum Note (Signed)
Addended by: Gayla Medicus on: 05/30/2013 12:40 PM   Modules accepted: Orders

## 2013-05-31 LAB — PROLACTIN: Prolactin: 3.6 ng/mL

## 2013-06-03 LAB — ACTH: C206 ACTH: 5 pg/mL — ABNORMAL LOW (ref 6–50)

## 2013-06-05 ENCOUNTER — Ambulatory Visit (INDEPENDENT_AMBULATORY_CARE_PROVIDER_SITE_OTHER): Payer: 59 | Admitting: Psychology

## 2013-06-05 DIAGNOSIS — F331 Major depressive disorder, recurrent, moderate: Secondary | ICD-10-CM

## 2013-06-09 ENCOUNTER — Other Ambulatory Visit: Payer: Self-pay | Admitting: Internal Medicine

## 2013-06-09 LAB — HM PAP SMEAR: HM Pap smear: NORMAL

## 2013-06-10 ENCOUNTER — Encounter: Payer: Self-pay | Admitting: Internal Medicine

## 2013-06-10 LAB — OTHER SOLSTAS TEST

## 2013-06-10 NOTE — Addendum Note (Signed)
Addended by: Carlus Pavlov on: 06/10/2013 12:39 PM   Modules accepted: Level of Service

## 2013-06-12 ENCOUNTER — Ambulatory Visit: Payer: 59 | Admitting: Psychology

## 2013-06-19 ENCOUNTER — Ambulatory Visit (INDEPENDENT_AMBULATORY_CARE_PROVIDER_SITE_OTHER): Payer: 59 | Admitting: Psychology

## 2013-06-19 DIAGNOSIS — F331 Major depressive disorder, recurrent, moderate: Secondary | ICD-10-CM

## 2013-06-26 ENCOUNTER — Ambulatory Visit (INDEPENDENT_AMBULATORY_CARE_PROVIDER_SITE_OTHER): Payer: 59 | Admitting: Psychology

## 2013-06-26 DIAGNOSIS — F331 Major depressive disorder, recurrent, moderate: Secondary | ICD-10-CM

## 2013-07-02 ENCOUNTER — Ambulatory Visit (INDEPENDENT_AMBULATORY_CARE_PROVIDER_SITE_OTHER): Payer: 59 | Admitting: Psychology

## 2013-07-02 DIAGNOSIS — F331 Major depressive disorder, recurrent, moderate: Secondary | ICD-10-CM

## 2013-07-03 ENCOUNTER — Emergency Department: Payer: Self-pay | Admitting: Emergency Medicine

## 2013-07-03 ENCOUNTER — Other Ambulatory Visit: Payer: Self-pay

## 2013-07-03 LAB — BASIC METABOLIC PANEL
ANION GAP: 9 (ref 7–16)
BUN: 13 mg/dL (ref 7–18)
CALCIUM: 9 mg/dL (ref 8.5–10.1)
CO2: 23 mmol/L (ref 21–32)
CREATININE: 0.98 mg/dL (ref 0.60–1.30)
Chloride: 107 mmol/L (ref 98–107)
EGFR (African American): >60
EGFR (Non-African Amer.): >60
Glucose: 114 mg/dL — ABNORMAL HIGH (ref 65–99)
Osmolality: 279 (ref 275–301)
Potassium: 3.9 mmol/L (ref 3.5–5.1)
SODIUM: 139 mmol/L (ref 136–145)

## 2013-07-03 LAB — CBC WITH DIFFERENTIAL/PLATELET
Basophil #: 0.1 10*3/uL (ref 0.0–0.1)
Basophil %: 1.2 %
EOS ABS: 0.1 10*3/uL (ref 0.0–0.7)
EOS PCT: 1.5 %
HCT: 41.6 % (ref 35.0–47.0)
HGB: 13.7 g/dL (ref 12.0–16.0)
Lymphocyte #: 2.5 10*3/uL (ref 1.0–3.6)
Lymphocyte %: 30.3 %
MCH: 30.6 pg (ref 26.0–34.0)
MCHC: 32.9 g/dL (ref 32.0–36.0)
MCV: 93 fL (ref 80–100)
Monocyte #: 0.6 x10 3/mm (ref 0.2–0.9)
Monocyte %: 6.9 %
NEUTROS ABS: 4.9 10*3/uL (ref 1.4–6.5)
NEUTROS PCT: 60.1 %
Platelet: 313 10*3/uL (ref 150–440)
RBC: 4.47 10*6/uL (ref 3.80–5.20)
RDW: 13.7 % (ref 11.5–14.5)
WBC: 8.2 10*3/uL (ref 3.6–11.0)

## 2013-07-03 LAB — URINALYSIS, COMPLETE
BLOOD: NEGATIVE
Bilirubin,UR: NEGATIVE
Glucose,UR: NEGATIVE mg/dL (ref 0–75)
Hyaline Cast: 7
KETONE: NEGATIVE
NITRITE: NEGATIVE
Ph: 5 (ref 4.5–8.0)
Protein: NEGATIVE
SPECIFIC GRAVITY: 1.008 (ref 1.003–1.030)
Squamous Epithelial: <1

## 2013-07-03 LAB — TROPONIN I

## 2013-07-05 LAB — URINE CULTURE

## 2013-07-09 ENCOUNTER — Telehealth: Payer: Self-pay | Admitting: Internal Medicine

## 2013-07-09 NOTE — Telephone Encounter (Signed)
i cannot refer without seeing patient .  Make appt,  I can prescribe hydroxyzine for the rash if she would like to try until she can see me.

## 2013-07-09 NOTE — Telephone Encounter (Signed)
Pt left vm asking to speak to triage nurse regarding swelling in the back of neck and base of skull for advice.

## 2013-07-09 NOTE — Telephone Encounter (Signed)
Patient was seen in ED and advised this is an anxiety issue, patient stated she has bumps and lesions where she has been scratching a rash and would like a dermatology appointment please advise.

## 2013-07-09 NOTE — Telephone Encounter (Signed)
Left message for patient to call office no answer.

## 2013-07-10 NOTE — Telephone Encounter (Signed)
FYI

## 2013-07-10 NOTE — Telephone Encounter (Signed)
Pt returned call.  Scheduled appt with Rey 7/9 @ 10:30.  States she has used medication for her problem in the past and will bring with her.  She is out of the medication.

## 2013-07-11 ENCOUNTER — Encounter: Payer: Self-pay | Admitting: Adult Health

## 2013-07-11 ENCOUNTER — Ambulatory Visit (INDEPENDENT_AMBULATORY_CARE_PROVIDER_SITE_OTHER): Payer: 59 | Admitting: Adult Health

## 2013-07-11 VITALS — BP 123/86 | HR 100 | Temp 98.4°F | Resp 14 | Wt 217.8 lb

## 2013-07-11 DIAGNOSIS — L209 Atopic dermatitis, unspecified: Secondary | ICD-10-CM

## 2013-07-11 DIAGNOSIS — L2089 Other atopic dermatitis: Secondary | ICD-10-CM

## 2013-07-11 DIAGNOSIS — R634 Abnormal weight loss: Secondary | ICD-10-CM

## 2013-07-11 MED ORDER — CLOBETASOL PROPIONATE 0.05 % EX SOLN: 1.0000 "application " | Freq: Two times a day (BID) | CUTANEOUS | Status: DC

## 2013-07-11 NOTE — Progress Notes (Signed)
Pre visit review using our clinic review tool, if applicable. No additional management support is needed unless otherwise documented below in the visit note. 

## 2013-07-11 NOTE — Progress Notes (Signed)
Patient ID: [name], female   DOB: 03/31/1955, 58 y.o.   MRN: [id number]   Subjective:    Patient ID: [name], female    DOB: May 01, 1955, 58 y.o.   MRN: [id number]  HPI  Pt presents with scalp rash. She has a hx of atopic dermatitis and has been prescribed clobetasol for same. She reports first noticing these areas on her scalp on 06/25/13. She denies coloring her hair for > 10 weeks. She reports that the hair color did not trigger this event. She reports feeling "pustules" on her scalp. She has scratched several areas causing secondary lesions. She reports that these areas on her scalp "make her feel bad". She went to the ED thinking she was having a stroke which she reports was ruled out. Pt has a dermatologist in [facility] but would like one in the area.  Pt is requesting refills on her zanaflex. She reports taking this for muscle spasms. She is also on baclofen. She also reports that she is nauseated and wants a prescription for zofran. She has been losing weight because she cannot eat. Note, Pt appears overly sedated during clinic.  Past Medical History  Diagnosis Date  . Tobacco abuse   . Depression   . Thyroid disease   . Hyperlipidemia   . Hypertension   . Arthritis   . Atopic dermatitis     scalp  . Anxiety     Current Outpatient Prescriptions on File Prior to Visit  Medication Sig Dispense Refill  . albuterol (PROVENTIL HFA;VENTOLIN HFA) 108 (90 BASE) MCG/ACT inhaler Inhale 2 puffs into the lungs every 6 (six) hours as needed.      . baclofen (LIORESAL) 10 MG tablet Take 1 tablet (10 mg total) by mouth as needed.  30 each  6  . buPROPion (WELLBUTRIN XL) 150 MG 24 hr tablet Take 1 tablet by mouth every morning.      . clonazePAM (KLONOPIN) 1 MG tablet Take 1 tablet (1 mg total) by mouth 2 (two) times daily as needed.  60 tablet  0  . dextroamphetamine (DEXEDRINE SPANSULE) 15 MG 24 hr capsule       . Diclofenac Sodium 3 % GEL Diclofenac,baclofen, cyclobenzaprine,  lidocaine gel to affected area twice daily  100 g  1  . Escitalopram Oxalate (LEXAPRO PO) Take 30 mg by mouth daily.       . furosemide (LASIX) 20 MG tablet Take 1 tablet (20 mg total) by mouth 2 (two) times daily.  30 tablet  6  . HYDROcodone-acetaminophen (NORCO/VICODIN) 5-325 MG per tablet Take 1 tablet by mouth every 6 (six) hours as needed.  90 tablet  0  . hydrocortisone (CORTEF) 5 MG tablet Take by mouth 10 mg in am and 5 mg in pm  100 tablet  11  . Lactulose 20 GM/30ML SOLN Take 30 mLs (20 g total) by mouth every 6 (six) hours as needed. For relief of constipation  240 mL  2  . levothyroxine (SYNTHROID, LEVOTHROID) 88 MCG tablet TAKE 1 TABLET BY MOUTH EVERY DAY  90 tablet  3  . lubiprostone (AMITIZA) 8 MCG capsule Take 18 mcg by mouth daily.      . Nebivolol HCl (BYSTOLIC PO) Take 2.5 mg by mouth daily.       . nitrofurantoin (MACRODANTIN) 100 MG capsule Take 100 mg by mouth 2 (two) times daily.      [name] oxyCODONE (OXY IR/ROXICODONE) 5 MG immediate release tablet       .  pantoprazole (PROTONIX) 40 MG tablet Take 1 tablet (40 mg total) by mouth daily.  30 tablet  1  . predniSONE (DELTASONE) 5 MG tablet Take 1 tablet (5 mg total) by mouth daily with breakfast.  30 tablet  1  . sulfamethoxazole-trimethoprim (BACTRIM,SEPTRA) 400-80 MG per tablet Take 1 tablet by mouth 2 (two) times daily.      . tizanidine (ZANAFLEX) 6 MG capsule TAKE 1 CAPSULE (6 MG TOTAL) BY MOUTH 3 (THREE) TIMES DAILY AS NEEDED FOR MUSCLE SPASMS.  60 capsule  1  . trifluoperazine (STELAZINE) 1 MG tablet Take 1 mg by mouth 3 (three) times daily.       No current facility-administered medications on file prior to visit.     Review of Systems  Constitutional: Positive for appetite change. Negative for fever and chills.  HENT: Negative.   Respiratory: Negative.   Cardiovascular: Negative.   Musculoskeletal:       Reports muscle spasms  Skin: Positive for rash.       scalp  Psychiatric/Behavioral: Negative.          Objective:  BP 123/86  Pulse 110  Temp(Src) 98.4 F (36.9 C) (Oral)  Resp 14  Wt 217 lb 12 oz (98.771 kg)  SpO2 98%   Physical Exam  Constitutional: No distress.  Appears overly sedated during clinic. Speech is slow.  HENT:  Head: Normocephalic and atraumatic.  Cardiovascular: Normal rate and regular rhythm.   Pulmonary/Chest: Effort normal. No respiratory distress.  Musculoskeletal: Normal range of motion.  Neurological:  Responds slowly to questions. Responds appropriately. Appears sedated.  Skin:  Atopic dermatitis of scalp. She has been picking at several areas and created secondary lesions.      Assessment & Plan:   1. Atopic dermatitis Clobetasol solution to scalp twice a day. Refer to local dermatologist.  - Ambulatory referral to Dermatology  2. Loss of weight Pt reports weight loss and not able to eat secondary to feeling nauseated. Looked back at patients weight for the previous months as follows:  12/17/12 - 221 lbs 04/01/13 - 211 lbs 04/22/13 - 216 lbs 04/26/13 - 220 lbs 07/11/13  - 217 lbs  Her weight appears to be stable. Nausea may be multifactorial including some side effects of some of her medications. I have instructed her to follow up with her PCP to discuss this further. She has an appointment this coming Monday.  Note, greater than 25 min spent in face to face communication including reviewing outside records. Pt has filled medications that were not listed on her MAR. Uncertain as to the pre scriber. Will discuss with her PCP.

## 2013-07-15 ENCOUNTER — Ambulatory Visit (INDEPENDENT_AMBULATORY_CARE_PROVIDER_SITE_OTHER): Payer: 59 | Admitting: Internal Medicine

## 2013-07-15 ENCOUNTER — Encounter: Payer: Self-pay | Admitting: Internal Medicine

## 2013-07-15 VITALS — BP 118/84 | HR 107 | Temp 97.7°F | Resp 20 | Ht 64.0 in | Wt 215.5 lb

## 2013-07-15 DIAGNOSIS — E2749 Other adrenocortical insufficiency: Secondary | ICD-10-CM

## 2013-07-15 DIAGNOSIS — R404 Transient alteration of awareness: Secondary | ICD-10-CM

## 2013-07-15 DIAGNOSIS — M545 Low back pain, unspecified: Secondary | ICD-10-CM

## 2013-07-15 DIAGNOSIS — L259 Unspecified contact dermatitis, unspecified cause: Secondary | ICD-10-CM

## 2013-07-15 DIAGNOSIS — E2740 Unspecified adrenocortical insufficiency: Secondary | ICD-10-CM

## 2013-07-15 DIAGNOSIS — K2990 Gastroduodenitis, unspecified, without bleeding: Secondary | ICD-10-CM

## 2013-07-15 DIAGNOSIS — L2089 Other atopic dermatitis: Secondary | ICD-10-CM

## 2013-07-15 DIAGNOSIS — L209 Atopic dermatitis, unspecified: Secondary | ICD-10-CM

## 2013-07-15 DIAGNOSIS — K2970 Gastritis, unspecified, without bleeding: Secondary | ICD-10-CM

## 2013-07-15 DIAGNOSIS — L309 Dermatitis, unspecified: Secondary | ICD-10-CM

## 2013-07-15 DIAGNOSIS — K5900 Constipation, unspecified: Secondary | ICD-10-CM

## 2013-07-15 MED ORDER — CLOBETASOL PROPIONATE 0.05 % EX FOAM: Freq: Two times a day (BID) | CUTANEOUS | Status: DC

## 2013-07-15 NOTE — Patient Instructions (Signed)
Stop the benadryl,  It is too sedating,  Use allegra for any itching  New clobetasol foalm sent to pharmacy  Septra for 1 week to cover any possible scalp infection  Take your wellbutrin and lexapro at 10 am with food to see if the nausea resolves   Continue the dexadrine and hydrocortisone at 7 am   You need to see your psychiatrist sooner ,  Not in 3 months   If the nausea doesn't improve with medication changes,  Please let me know

## 2013-07-15 NOTE — Progress Notes (Signed)
Patient ID: [name], female   DOB: 06/02/1955, 58 y.o.   MRN: [id number]  Patient Active Problem List   Diagnosis Date Noted  . Altered mental status 07/16/2013  . Unspecified constipation 04/22/2013  . Unspecified gastritis and gastroduodenitis without mention of hemorrhage 04/22/2013  . Adrenal insufficiency 04/01/2013  . Lumbago 08/17/2012  . History of acute pancreatitis 07/11/2012  . Hospital discharge follow-up 06/29/2012  . Colon polyp, hyperplastic 04/30/2012  . Vestibular dizziness 01/31/2012  . B12 deficiency 04/11/2011  . Fatty liver disease, nonalcoholic 04/11/2011  . Obesity, Class III, BMI 40-49.9 (morbid obesity) 04/11/2011  . Tobacco abuse   . Depression   . Hypothyroidism   . Hyperlipidemia   . Hypertension   . Arthritis   . Atopic dermatitis   . Anxiety     Subjective:  CC:   Chief Complaint  Patient presents with  . Acute Visit    Nausea, and dzziness, rash has not resolved,    HPI:   [name] is a 58 y.o. female who presents for   follow up on multiple issues including recurrent scalp rash treated by [facility] last week.  [facility] very [facility], had been recently treated in the ER ,  and requested additional medications for nausea   Cc: Nausea:  Started in mid June  Occurs in the morning  On a frequent but not daily basis,  Wakes up with  It.  Continues to endorse weight loss based on decreased appetite, but review of weights over the past 6 months shows stability.  Takes multiple meds at 7 am before eating, goes back to bed, then rises at 9 am or  10 am with nausea. sometimes lasts all day long unless she takes zofran, which she is requesting refill.  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 Lafe GarinGherge.   Scalp rash: treated years ago by dermatology with clobetasol proprionate foam 0.05%  for "atypical dermatitis")  because the areas of excoriation became swollen, prompting an ER visit on  June 22 . Not  sure what they treated her for /with in ER.   Has been taking benadryl  aroudn the clock to control the itching,   Lethargy/AMS: Discussed her observed mental status last week.  She appeared overly sedated.  Several clinic staff noticed. Med list reviewed in detail.  patient attributes mental status at that time to use of benadryl. She is much more alert today. She is not taking any narcotics. For her back pain : chronic, recurrent, she is using zanaflex Not baclofen   Depression:  Sees a therapist and  A NP at Dr Ann MakiParrish McKinney's office for worsening depression.  The NP has made recent medication changes but patietn's folllow up is in 3 months. She has been taking current regimen of wellbutrin,  Lexapro.,  Dexedrine and  Twice daily clonazepam.  Had suicidal thoughts last week but no plans to act on it.  . [phone]  Has never seen [facility].  Was previously treated with stelazine by prior NP, which was effective but was stopped after 15 years due to published risk of tardive dyskinesia. NP Oneill took over from previous NP Mr.  Lytle ButteLafey.   Was prescribed seroquel  In June  But she stopped it after 2 days due to intolerance .  Was then changed to risperdal,  which was overly sedating  Hasn't been on either med  For several weeks.   Past Medical History  Diagnosis Date  .  Tobacco abuse   . Depression   . Thyroid disease   . Hyperlipidemia   . Hypertension   . Arthritis   . Atopic dermatitis     scalp  . Anxiety     Past Surgical History  Procedure Laterality Date  . Rotator cuff repair    . Lumbar disc surgery         The following portions of the patient's history were reviewed and updated as appropriate: Allergies, current medications, and problem list.    Review of Systems:   Patient denies headache, fevers, malaise, unintentional weight loss, skin rash, eye pain, sinus congestion and sinus pain, sore throat, dysphagia,  hemoptysis , cough, dyspnea, wheezing, chest pain,  palpitations, orthopnea, edema, abdominal pain, nausea, melena, diarrhea, constipation, flank pain, dysuria, hematuria, urinary  Frequency, nocturia, numbness, tingling, seizures,  Focal weakness, Loss of consciousness,  Tremor, insomnia, depression, anxiety, and suicidal ideation.     History   Social History  . Marital Status: Married    Spouse Name: N/A    Number of Children: N/A  . Years of Education: N/A   Occupational History  . Not on file.   Social History Main Topics  . Smoking status: Light Tobacco Smoker -- 0.40 packs/day    Types: Cigarettes  . Smokeless tobacco: Never Used  . Alcohol Use: Yes     Comment: occasional  . Drug Use: No  . Sexual Activity: Not on file   Other Topics Concern  . Not on file   Social History Narrative  . No narrative on file    Objective:  Filed Vitals:   07/15/13 0945  BP: 118/84  Pulse: 107  Temp: 97.7 F (36.5 C)  Resp: 20     General appearance: alert, cooperative and appears stated age Ears: normal TM's and external ear canals both ears Throat: lips, mucosa, and tongue normal; teeth and gums normal Neck: no adenopathy, no carotid bruit, supple, symmetrical, trachea midline and thyroid not enlarged, symmetric, no tenderness/mass/nodules Back: symmetric, no curvature. ROM normal. No CVA tenderness. Lungs: clear to auscultation bilaterally Heart: regular rate and rhythm, S1, S2 normal, no murmur, click, rub or gallop Abdomen: soft, non-tender; bowel sounds normal; no masses,  no organomegaly Pulses: 2+ and symmetric Skin: Skin color, texture, turgor normal. No rashes or lesions Lymph nodes: Cervical, supraclavicular, and axillary nodes normal.  Assessment and Plan:  Unspecified constipation Managed with fiber supplements and prn lactulose  Lumbago Avoiding use of narcotics in this patient due to chronic use of multiple psychoactive drugs.   Adrenal insufficiency Suggested by AM cortisol of 0.7 mcg drawn during  [facility] admission for hypotension and abd pain with normal CT March 26 .  Has been transitioned from prednisone 5 mg daily to twice daily hydrocortisone by Dr Lafe Garin.    Atopic dermatitis She has several scalp excoriations that are enflamed.  Since she is clearly scratching them. Will treat for scalp infection with septra, and resume the clobetasol foam that she prefers.  Dermatology referral has been requested and Dr Adolphus Birchwood cannot see her until September so she is requesting more effort to find her a more urgent appt.   Altered mental status Secondary to polypharmacy.  Advised to avoid benadryl and discuss meds with psychiatry.   Unspecified gastritis and gastroduodenitis without mention of hemorrhage Despite use of daily protonix and management of adrenal insufficiency, her nausea persists and may be medication induced.  Advised to cahnge medication administration to later in the morning after food.  If nausea persists. Will need EGD  A total of 40 minutes was spent with patient more than half of which was spent in counseling on the above mentioned issues , reviewing records from other prviders and coordination of care.  Updated Medication List Outpatient Encounter Prescriptions as of 07/15/2013  Medication Sig  . albuterol (PROVENTIL HFA;VENTOLIN HFA) 108 (90 BASE) MCG/ACT inhaler Inhale 2 puffs into the lungs every 6 (six) hours as needed.  [name] buPROPion (WELLBUTRIN XL) 150 MG 24 hr tablet Take 1 tablet by mouth every morning.  . clonazePAM (KLONOPIN) 1 MG tablet Take 1 tablet (1 mg total) by mouth 2 (two) times daily as needed.  [name] dextroamphetamine (DEXEDRINE SPANSULE) 15 MG 24 hr capsule   . Diclofenac Sodium 3 % GEL Diclofenac,baclofen, cyclobenzaprine, lidocaine gel to affected area twice daily  . Escitalopram Oxalate (LEXAPRO PO) Take 30 mg by mouth daily.   . furosemide (LASIX) 20 MG tablet Take 1 tablet (20 mg total) by mouth 2 (two) times daily.  . hydrocortisone (CORTEF) 5 MG tablet  Take by mouth 10 mg in am and 5 mg in pm  . Lactulose 20 GM/30ML SOLN Take 30 mLs (20 g total) by mouth every 6 (six) hours as needed. For relief of constipation  . levothyroxine (SYNTHROID, LEVOTHROID) 88 MCG tablet TAKE 1 TABLET BY MOUTH EVERY DAY  . lubiprostone (AMITIZA) 8 MCG capsule Take 18 mcg by mouth daily.  . ondansetron (ZOFRAN-ODT) 4 MG disintegrating tablet Take 4 mg by mouth every 8 (eight) hours as needed for nausea or vomiting.  . pantoprazole (PROTONIX) 40 MG tablet Take 1 tablet (40 mg total) by mouth daily.  . tizanidine (ZANAFLEX) 6 MG capsule TAKE 1 CAPSULE (6 MG TOTAL) BY MOUTH 3 (THREE) TIMES DAILY AS NEEDED FOR MUSCLE SPASMS.  . [DISCONTINUED] baclofen (LIORESAL) 10 MG tablet Take 1 tablet (10 mg total) by mouth as needed.  . [DISCONTINUED] clobetasol (TEMOVATE) 0.05 % external solution Apply 1 application topically 2 (two) times daily.  . [DISCONTINUED] HYDROcodone-acetaminophen (NORCO/VICODIN) 5-325 MG per tablet Take 1 tablet by mouth every 6 (six) hours as needed.  . [DISCONTINUED] oxyCODONE (OXY IR/ROXICODONE) 5 MG immediate release tablet   . [DISCONTINUED] sulfamethoxazole-trimethoprim (BACTRIM,SEPTRA) 400-80 MG per tablet Take 1 tablet by mouth 2 (two) times daily.  . clobetasol (OLUX) 0.05 % topical foam Apply topically 2 (two) times daily.     Orders Placed This Encounter  Procedures  . Ambulatory referral to Dermatology    No Follow-up on file.

## 2013-07-15 NOTE — Progress Notes (Signed)
Pre-visit discussion using our clinic review tool. No additional management support is needed unless otherwise documented below in the visit note.  

## 2013-07-16 ENCOUNTER — Other Ambulatory Visit: Payer: Self-pay

## 2013-07-16 ENCOUNTER — Encounter: Payer: Self-pay | Admitting: Internal Medicine

## 2013-07-16 ENCOUNTER — Telehealth: Payer: Self-pay | Admitting: Internal Medicine

## 2013-07-16 DIAGNOSIS — R4182 Altered mental status, unspecified: Secondary | ICD-10-CM | POA: Insufficient documentation

## 2013-07-16 MED ORDER — SULFAMETHOXAZOLE-TMP DS 800-160 MG PO TABS: 1.0000 | ORAL_TABLET | Freq: Two times a day (BID) | ORAL | Status: DC

## 2013-07-16 NOTE — Assessment & Plan Note (Signed)
She has several scalp excoriations that are enflamed.  Since she is clearly scratching them. Will treat for scalp infection with septra, and resume the clobetasol foam that she prefers.  Dermatology referral has been requested and Dr Adolphus Birchwoodasher cannot see her until September so she is requesting more effort to find her a more urgent appt.

## 2013-07-16 NOTE — Telephone Encounter (Signed)
Yes, carolyn is working on a referral septra ds called to pharmacy

## 2013-07-16 NOTE — Assessment & Plan Note (Addendum)
Managed with fiber supplements and prn lactulose

## 2013-07-16 NOTE — Telephone Encounter (Signed)
Patient stated she has completely used the script that was given by the Raquel Rey NP, and cannot obtain any more for One month. Patient notified of referral and of ABX called to pharmacy

## 2013-07-16 NOTE — Assessment & Plan Note (Signed)
Avoiding use of narcotics in this patient due to chronic use of multiple psychoactive drugs.

## 2013-07-16 NOTE — Assessment & Plan Note (Signed)
Suggested by AM cortisol of 0.7 mcg drawn [facility] for hypot[facility] and abd pain with normal CT March 26 .  Has been transitioned from prednisone 5 mg daily to twice daily hydrocortisone by Dr Lafe GarinGherge.

## 2013-07-16 NOTE — Assessment & Plan Note (Signed)
Secondary to polypharmacy.  Advised to avoid benadryl and discuss meds with psychiatry.

## 2013-07-16 NOTE — Assessment & Plan Note (Signed)
Despite use of daily protonix and management of adrenal insufficiency, her nausea persists and may be medication induced.  Advised to cahnge medication administration to later in the morning after food.  If nausea persists. Will need EGD

## 2013-07-16 NOTE — Telephone Encounter (Signed)
Patient called and stated an ABX was to be called in for patient at visit but pharmacy does not have, also the clobetasol is to expensive at $300.00 patient cannot afford and wants to know if a dermatology referral is under way? AVS states Septra for 1 week.

## 2013-07-17 ENCOUNTER — Telehealth: Payer: Self-pay | Admitting: Internal Medicine

## 2013-07-17 ENCOUNTER — Ambulatory Visit (INDEPENDENT_AMBULATORY_CARE_PROVIDER_SITE_OTHER): Payer: 59 | Admitting: Psychology

## 2013-07-17 DIAGNOSIS — F331 Major depressive disorder, recurrent, moderate: Secondary | ICD-10-CM

## 2013-07-17 NOTE — Telephone Encounter (Signed)
Relevant patient education assigned to patient using Emmi. ° °

## 2013-07-18 ENCOUNTER — Other Ambulatory Visit: Payer: Self-pay

## 2013-07-18 ENCOUNTER — Other Ambulatory Visit (INDEPENDENT_AMBULATORY_CARE_PROVIDER_SITE_OTHER): Payer: 59

## 2013-07-18 ENCOUNTER — Other Ambulatory Visit: Payer: Self-pay | Admitting: *Deleted

## 2013-07-18 DIAGNOSIS — E2740 Unspecified adrenocortical insufficiency: Secondary | ICD-10-CM

## 2013-07-18 DIAGNOSIS — E2749 Other adrenocortical insufficiency: Secondary | ICD-10-CM

## 2013-07-18 LAB — CORTISOL: Cortisol, Plasma: 2.8 ug/dL

## 2013-07-19 LAB — ACTH: ACTH: 4.1 pg/mL — AB (ref 7.2–63.3)

## 2013-07-24 ENCOUNTER — Ambulatory Visit (INDEPENDENT_AMBULATORY_CARE_PROVIDER_SITE_OTHER): Payer: 59 | Admitting: Psychology

## 2013-07-24 DIAGNOSIS — F331 Major depressive disorder, recurrent, moderate: Secondary | ICD-10-CM

## 2013-07-26 ENCOUNTER — Other Ambulatory Visit: Payer: Self-pay | Admitting: Internal Medicine

## 2013-07-26 ENCOUNTER — Encounter: Payer: Self-pay | Admitting: Internal Medicine

## 2013-07-26 ENCOUNTER — Ambulatory Visit (INDEPENDENT_AMBULATORY_CARE_PROVIDER_SITE_OTHER): Payer: 59 | Admitting: Internal Medicine

## 2013-07-26 VITALS — BP 128/90 | HR 101 | Temp 97.9°F | Resp 12 | Wt 218.0 lb

## 2013-07-26 DIAGNOSIS — E2740 Unspecified adrenocortical insufficiency: Secondary | ICD-10-CM

## 2013-07-26 DIAGNOSIS — E2749 Other adrenocortical insufficiency: Secondary | ICD-10-CM

## 2013-07-26 NOTE — Progress Notes (Addendum)
Patient ID: [name], female   DOB: 06-Sep-1955, 58 y.o.   MRN: [id number]   HPI  [name] is a 58 y.o.-year-old female, returning for f/u for adrenal insufficiency. She is here with her husband who offers part of the history. Last visit 3 mo ago.  She feels her anxiety is better >> Klonopin reduced.  She c/o high dBP: 90-104. At one time, her pulse was 134. He sees cardiology once a year [facility]).  She is taking Bystolic (decreased 6 mo ago).   She also c/o tremors.   Reviewed hx: Pt. has been dx with adrenal insufficiency at her last hospitalization at [facility], when she presented with hypotension, R flank pain and diarrhea. An am cortisol was checked and this was 0.7 (of note, she did receive dilaudid during the hospitalization).    She was seen by PCP on 04/02/2013, and she was still c/o fatigue,weight loss (15 lbs) and was found to have a low-normal BP: 100/68, P 81. A cortisol was checked at 9 am: 6.5, and an ACTH was 9. She was started on Prednisone: 5 mg >> feels a little more energertic.  Of note, pt has a h/o back pain and had surgery 2003-2004 >> feels like she never recovered after the surgery. Remembers her BP was very low after the Sx. Her pain is better after the Sx., but still hurts. She takes Norco, but not all the time (might go 1 mo w/o them), but has weeks when the back pain is more severe and she takes it consistently then. She also has Oxycodone on her med list, but she does not like taking it >> itching.  Pt was on oral steroid pain for back pain, but not since 12/2012. She has had 3-4 joint steroid injections up to 3 years ago. She is using Clobetasol solution on her head.  She was started on Prednisone by PCP >> we switched to Hydrocortisone (HC) 10 mg in am and 5 mg in pm at last visit >> she is tolerating it well, still feels fatigued, but better.  Before this visit, we checked a cortisol and ACTH after stopping steroid for 24h: Component     Latest Ref  Rng 07/18/2013  ACTH     7.2 - 63.3 pg/mL 4.1 (L)  Cortisol, Plasma      2.8  We continued the steroid at the same dose.  She still c/o: - lack of energy, fatigue - problems concentrating - + weight loss (not by our scale) - + constipation, + nausea - + dry skin - no hair falling - + easy bruising  I reviewed pt's medications, allergies, PMH, social hx, family hx and no changes required, except as mentioned above.  ROS: Constitutional: see HPI, + poor sleep, + feeling ot Eyes: + blurry vision, no xerophthalmia ENT: + sore throat, no nodules palpated in throat, no dysphagia/odynophagia, + hoarseness Cardiovascular: no CP/+ SOB/no palpitations/+ leg swelling Respiratory: no cough/+ SOB Gastrointestinal: + N/no V/D/+ C Musculoskeletal: + muscle aches/no joint aches Skin: no rashes, + itching, + easy bruising Neurological: no tremors/numbness/tingling/dizziness, + HA  PE: BP 128/90  Pulse 101  Temp(Src) 97.9 F (36.6 C) (Oral)  Resp 12  Wt 218 lb (98.884 kg)  SpO2 96% Wt Readings from Last 3 Encounters:  07/26/13 218 lb (98.884 kg)  07/15/13 215 lb 8 oz (97.75 kg)  07/11/13 217 lb 12 oz (98.771 kg)   Constitutional: overweight, in NAD, full supraclavicular fat pads Eyes: PERRLA, EOMI, no exophthalmos  ENT: moist mucous membranes, no thyromegaly, no cervical lymphadenopathy Cardiovascular: RRR, No MRG Respiratory: CTA B Gastrointestinal: abdomen soft, NT, ND, BS+ Musculoskeletal: no deformities, strength intact in all 4 Skin: moist, warm, no rashes; thin skin; no dark knuckle discoloration Neurological: + tremor with outstretched hands, DTR normal in all 4  ASSESSMENT: 1. 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 S - we continued her steroid replacement (switched to hydrocortisone at last visit, since this has a shorter half life, and allows the hypothalamic-pituitary-adrenal axis to recover more easily). We checked her cortisol, ACTH 1 week ago >> still abnormal >> we continued the Hydrocortisone. - of note, a cosyntropin stimulation test was normal 2 mo ago, but this can happen with secondary AI - the other pituitary hormone levels were normal at that time - will also check an MRI just to make sure there is no pituitary organic cause for her AI - continue HC 10 mg in am and 5 mg in pm, best ~3 pm - I again explained side effects of overreplacement on many organs in the body >> we need to keep her on the minimum dose that allows her to feel well - will recheck her cortisol and ACTH in 4 mo: Patient Instructions  Please come back in 4 months for lab work. Please skip the evening dose of HC and also the HC dose in the day of the tests. Come at 8 am, fasting. Please return in 1 year.  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 Low cortisol and ACTH. Initial encounter.  [facility] and creatinine [facility] at  [address]  Results: BUN 20 mg/dL, Creatinine 1.1 mg/dL.  EXAM: MRI HEAD WITHOUT AND WITH CONTRAST  TECHNIQUE: Multiplanar, multiecho pulse sequences of the brain and surrounding structures were obtained without and with intravenous contrast.  CONTRAST: 10mL MULTIHANCE GADOBENATE DIMEGLUMINE 529 MG/ML IV SOLN  COMPARISON: [facility] Brain MRI 11/03/2009  FINDINGS: Cerebral volume is not significantly  changed. Major intracranial vascular flow voids are stable, dominant distal left vertebral artery. No restricted diffusion to suggest acute infarction. No midline shift, mass effect, evidence of mass lesion, ventriculomegaly, extra-axial collection or acute intracranial hemorrhage. Cervicomedullary junction within normal limits. Negative visualized cervical spine. Wallace CullensGray and white matter signal is within normal limits throughout the brain. Excluding the pituitary region, No abnormal enhancement identified.  Visible internal auditory structures appear normal. Visualized paranasal sinuses and mastoids are clear. Visualized orbit soft tissues are within  normal limits. Visualized bone marrow signal is within normal limits. Visualized scalp soft tissues are within normal limits.  Dedicated pituitary imaging. Overall pituitary size and configuration are normal, and appear stable. Normal infundibulum. No suprasellar mass or mass effect. Normal hypothalamus. Normal cavernous sinus. Following contrast, it dynamic and delayed pituitary gland enhancement is within normal limits. No pituitary lesion or mass identified. No abnormal enhancement identified elsewhere.  IMPRESSION: 1. Normal MRI appearance of the pituitary. 2. Stable and normal MRI appearance of the brain.   Electronically Signed By: Augusto Gamble M.D. On: 08/29/2013 08:08  Great news: No pituitary tumor! This confirms that her AI is from either previous steroid use or 2/2 to narcotic use for back pain.

## 2013-07-26 NOTE — Patient Instructions (Signed)
Please come back in 4 months for lab work. Please skip the evening dose of HC and also the HC dose in the day of the tests. Come at 8 am, fasting. Please return in 1 year.

## 2013-07-31 ENCOUNTER — Ambulatory Visit (INDEPENDENT_AMBULATORY_CARE_PROVIDER_SITE_OTHER): Payer: 59 | Admitting: Psychology

## 2013-07-31 DIAGNOSIS — F331 Major depressive disorder, recurrent, moderate: Secondary | ICD-10-CM

## 2013-08-07 ENCOUNTER — Ambulatory Visit (INDEPENDENT_AMBULATORY_CARE_PROVIDER_SITE_OTHER): Payer: 59 | Admitting: Psychology

## 2013-08-07 DIAGNOSIS — F331 Major depressive disorder, recurrent, moderate: Secondary | ICD-10-CM

## 2013-08-09 LAB — HM MAMMOGRAPHY: HM Mammogram: NORMAL

## 2013-08-14 ENCOUNTER — Ambulatory Visit: Payer: 59 | Admitting: Psychology

## 2013-08-21 ENCOUNTER — Ambulatory Visit: Payer: 59 | Admitting: Psychology

## 2013-08-26 ENCOUNTER — Telehealth: Payer: Self-pay | Admitting: Internal Medicine

## 2013-08-28 ENCOUNTER — Ambulatory Visit
Admission: RE | Admit: 2013-08-28 | Discharge: 2013-08-28 | Disposition: A | Discharge status: Home / Self Care | Payer: 59 | Source: Ambulatory Visit | Attending: Internal Medicine | Admitting: Internal Medicine

## 2013-08-28 ENCOUNTER — Ambulatory Visit (INDEPENDENT_AMBULATORY_CARE_PROVIDER_SITE_OTHER): Payer: 59 | Admitting: Psychology

## 2013-08-28 DIAGNOSIS — F331 Major depressive disorder, recurrent, moderate: Secondary | ICD-10-CM

## 2013-08-28 MED ORDER — GADOBENATE DIMEGLUMINE 529 MG/ML IV SOLN
10.0000 mL | Freq: Once | INTRAVENOUS | Status: AC | PRN
  Administered 2013-08-28: 10 mL via INTRAVENOUS

## 2013-08-29 ENCOUNTER — Encounter: Payer: Self-pay | Admitting: Internal Medicine

## 2013-09-10 ENCOUNTER — Telehealth: Payer: Self-pay | Admitting: *Deleted

## 2013-09-10 DIAGNOSIS — E669 Obesity, unspecified: Secondary | ICD-10-CM

## 2013-09-10 DIAGNOSIS — R531 Weakness: Secondary | ICD-10-CM

## 2013-09-10 NOTE — Telephone Encounter (Signed)
Pt called left message on VM requesting referral to [employer] Program at [facility].  Pt further states she was referred to them by PT.  Pt is very emotional on her message.  Please advise

## 2013-09-10 NOTE — Telephone Encounter (Signed)
Referral is in process as requested for the Pt program [facility] [employer]"

## 2013-09-11 ENCOUNTER — Ambulatory Visit (INDEPENDENT_AMBULATORY_CARE_PROVIDER_SITE_OTHER): Payer: 59 | Admitting: Psychology

## 2013-09-11 DIAGNOSIS — F331 Major depressive disorder, recurrent, moderate: Secondary | ICD-10-CM

## 2013-09-11 NOTE — Telephone Encounter (Signed)
Spoke with pt advised referral submitted

## 2013-09-18 ENCOUNTER — Ambulatory Visit (INDEPENDENT_AMBULATORY_CARE_PROVIDER_SITE_OTHER): Payer: 59 | Admitting: Psychology

## 2013-09-18 DIAGNOSIS — F331 Major depressive disorder, recurrent, moderate: Secondary | ICD-10-CM

## 2013-09-25 ENCOUNTER — Ambulatory Visit (INDEPENDENT_AMBULATORY_CARE_PROVIDER_SITE_OTHER): Payer: 59 | Admitting: Psychology

## 2013-09-25 DIAGNOSIS — F331 Major depressive disorder, recurrent, moderate: Secondary | ICD-10-CM

## 2013-10-02 ENCOUNTER — Ambulatory Visit (INDEPENDENT_AMBULATORY_CARE_PROVIDER_SITE_OTHER): Payer: 59 | Admitting: Psychology

## 2013-10-02 DIAGNOSIS — F331 Major depressive disorder, recurrent, moderate: Secondary | ICD-10-CM

## 2013-10-17 ENCOUNTER — Telehealth: Payer: Self-pay

## 2013-10-17 DIAGNOSIS — M25572 Pain in left ankle and joints of left foot: Secondary | ICD-10-CM

## 2013-10-17 NOTE — Telephone Encounter (Signed)
The patient called and is hoping for a referral [facility].  She states she is having left foot pain when exercising. She describes the foot pain as going across her foot and then progresses up her foot  Callback - [phone]

## 2013-10-18 NOTE — Telephone Encounter (Addendum)
Patient has C/O pain at great toe on left foot to arch of foot. Has it some days and not others patient stated today not so bad Patient is asking for a referral to podiatry or Orthopaedics

## 2013-10-21 NOTE — Telephone Encounter (Signed)
Referral is in process as requested 

## 2013-10-23 ENCOUNTER — Ambulatory Visit (INDEPENDENT_AMBULATORY_CARE_PROVIDER_SITE_OTHER): Payer: 59 | Admitting: Psychology

## 2013-10-23 DIAGNOSIS — F332 Major depressive disorder, recurrent severe without psychotic features: Secondary | ICD-10-CM

## 2013-10-23 NOTE — Telephone Encounter (Signed)
Patient notified

## 2013-10-30 ENCOUNTER — Ambulatory Visit (INDEPENDENT_AMBULATORY_CARE_PROVIDER_SITE_OTHER): Payer: 59 | Admitting: Psychology

## 2013-10-30 DIAGNOSIS — F332 Major depressive disorder, recurrent severe without psychotic features: Secondary | ICD-10-CM

## 2013-11-06 ENCOUNTER — Ambulatory Visit: Payer: 59 | Admitting: Psychology

## 2013-11-13 ENCOUNTER — Ambulatory Visit (INDEPENDENT_AMBULATORY_CARE_PROVIDER_SITE_OTHER): Payer: 59 | Admitting: Psychology

## 2013-11-13 DIAGNOSIS — F332 Major depressive disorder, recurrent severe without psychotic features: Secondary | ICD-10-CM

## 2013-11-20 ENCOUNTER — Ambulatory Visit (INDEPENDENT_AMBULATORY_CARE_PROVIDER_SITE_OTHER): Payer: 59 | Admitting: Psychology

## 2013-11-20 DIAGNOSIS — F332 Major depressive disorder, recurrent severe without psychotic features: Secondary | ICD-10-CM

## 2013-11-27 ENCOUNTER — Ambulatory Visit (INDEPENDENT_AMBULATORY_CARE_PROVIDER_SITE_OTHER): Payer: 59 | Admitting: Psychology

## 2013-11-27 DIAGNOSIS — F332 Major depressive disorder, recurrent severe without psychotic features: Secondary | ICD-10-CM

## 2013-12-04 ENCOUNTER — Ambulatory Visit (INDEPENDENT_AMBULATORY_CARE_PROVIDER_SITE_OTHER): Payer: 59 | Admitting: Psychology

## 2013-12-04 DIAGNOSIS — F332 Major depressive disorder, recurrent severe without psychotic features: Secondary | ICD-10-CM

## 2013-12-09 ENCOUNTER — Ambulatory Visit (INDEPENDENT_AMBULATORY_CARE_PROVIDER_SITE_OTHER): Payer: 59 | Admitting: Internal Medicine

## 2013-12-09 ENCOUNTER — Encounter: Payer: Self-pay | Admitting: Internal Medicine

## 2013-12-09 VITALS — BP 118/70 | HR 89 | Temp 98.0°F | Resp 16 | Ht 64.25 in | Wt 233.8 lb

## 2013-12-09 DIAGNOSIS — Z0000 Encounter for general adult medical examination without abnormal findings: Secondary | ICD-10-CM

## 2013-12-09 DIAGNOSIS — E559 Vitamin D deficiency, unspecified: Secondary | ICD-10-CM

## 2013-12-09 DIAGNOSIS — I10 Essential (primary) hypertension: Secondary | ICD-10-CM

## 2013-12-09 DIAGNOSIS — E2740 Unspecified adrenocortical insufficiency: Secondary | ICD-10-CM

## 2013-12-09 DIAGNOSIS — E66813 Obesity, class 3: Secondary | ICD-10-CM

## 2013-12-09 DIAGNOSIS — E669 Obesity, unspecified: Secondary | ICD-10-CM

## 2013-12-09 DIAGNOSIS — E538 Deficiency of other specified B group vitamins: Secondary | ICD-10-CM

## 2013-12-09 DIAGNOSIS — E785 Hyperlipidemia, unspecified: Secondary | ICD-10-CM

## 2013-12-09 DIAGNOSIS — E034 Atrophy of thyroid (acquired): Secondary | ICD-10-CM

## 2013-12-09 DIAGNOSIS — R5383 Other fatigue: Secondary | ICD-10-CM

## 2013-12-09 DIAGNOSIS — F329 Major depressive disorder, single episode, unspecified: Secondary | ICD-10-CM

## 2013-12-09 DIAGNOSIS — K5900 Constipation, unspecified: Secondary | ICD-10-CM

## 2013-12-09 DIAGNOSIS — F32A Depression, unspecified: Secondary | ICD-10-CM

## 2013-12-09 DIAGNOSIS — E038 Other specified hypothyroidism: Secondary | ICD-10-CM

## 2013-12-09 MED ORDER — ALBUTEROL SULFATE HFA 108 (90 BASE) MCG/ACT IN AERS: 2.0000 | INHALATION_SPRAY | Freq: Four times a day (QID) | RESPIRATORY_TRACT | Status: DC | PRN

## 2013-12-09 MED ORDER — TIZANIDINE HCL 6 MG PO CAPS: ORAL_CAPSULE | ORAL | Status: DC

## 2013-12-09 NOTE — Assessment & Plan Note (Signed)
Thyroid function is due for repeat on current dose. No current changes needed.

## 2013-12-09 NOTE — Progress Notes (Signed)
Patient ID: Renee [name], female   DOB: 12/12/1955, 58 y.o.   MRN: [id number]  A Subjective:     Renee [name] is a 58 y.o. female and is here for a comprehensive physical exam. The patient reports problems with anxiety and weight gain.  "My anxiety level is much worse" since the klonipin was reduced by Dr.MCKinney's NP  To 0.5 mg bid.  However patient is less lethargic than on previous occasions.  She has gained weight.  Taking an exercise class, two times per week for the past week but not following any specific caloric or carbohydrate restricted diet.   She is overdue for eye exam and dental exam. Due to fear of anticipated procedure.     History   Social History  . Marital Status: Married    Spouse Name: N/A    Number of Children: N/A  . Years of Education: N/A   Occupational History  . Not on file.   Social History Main Topics  . Smoking status: Light Tobacco Smoker -- 0.40 packs/day    Types: Cigarettes  . Smokeless tobacco: Never Used  . Alcohol Use: Yes     Comment: occasional  . Drug Use: No  . Sexual Activity: Not on file   Other Topics Concern  . Not on file   Social History Narrative   Health Maintenance  Topic Date Due  . Janet BerlinETANUS/TDAP  03/09/1972  . MAMMOGRAM  11/30/2012  . ZOSTAVAX  03/09/2013  . INFLUENZA VACCINE  08/03/2013  . PAP SMEAR  11/30/2013  . COLONOSCOPY  04/16/2023    The following portions of the patient's history were reviewed and updated as appropriate: allergies, current medications, past family history, past medical history, past social history, past surgical history and problem list.  Review of Systems A comprehensive review of systems was negative.   Objective:     BP 118/70 mmHg  Pulse 89  Temp(Src) 98 F (36.7 C) (Oral)  Resp 16  Ht 5' 4.25" (1.632 m)  Wt 233 lb 12 oz (106.028 kg)  BMI 39.81 kg/m2  SpO2 96%  General appearance: alert, cooperative, obese and appears stated age Ears: normal TM's and external ear canals both  ears Throat: lips, mucosa, and tongue normal; teeth and gums normal Neck: no adenopathy, no carotid bruit, supple, symmetrical, trachea midline and thyroid not enlarged, symmetric, no tenderness/mass/nodules Back: symmetric, no curvature. ROM normal. No CVA tenderness. Lungs: clear to auscultation bilaterally Heart: regular rate and rhythm, S1, S2 normal, no murmur, click, rub or gallop Abdomen: soft, non-tender; bowel sounds normal; no masses,  no organomegaly Pulses: 2+ and symmetric Skin: Skin color, texture, turgor normal. No rashes or lesions Lymph nodes: Cervical, supraclavicular, and axillary nodes normal.   Assessment and plan:   Hypertension Well controlled on current regimen. Renal function is due, no changes today.  B12 deficiency She has missed several months of injections.  Resume here  monthly.  B12 level pending    Constipation Resolved,  With no use of amitiza or lactulose currently .   Hypothyroidism Thyroid function is due for repeat on current dose. No current changes needed.   Adrenal insufficiency Secondary , due to prednisone vs dilaudid use.  Repeat cortisol and acth levesl have been ordered and patient reminded to suspend her previous 2 hydrocortisone dose s  Hyperlipidemia Goal is LDL < 100 given hypertension,  Return for fasting labs    Obesity, Class III, BMI 40-49.9 (morbid obesity) I have addressed  BMI and recommended wt loss  of 10% of body weigh over the next 6 months using a low glycemic index diet and regular exercise a minimum of 5 days per week.    Encounter for preventive health examination Annual wellness  exam was done as well as a comprehensive physical exam and management of acute and chronic conditions .  During the course of the visit the patient was educated and counseled about appropriate screening and preventive services including :  diabetes screening, lipid analysis with projected  10 year  risk for CAD , nutrition counseling,  colorectal cancer screening, and recommended immunizations.  Printed recommendations for health maintenance screenings was given.    Updated Medication List Outpatient Encounter Prescriptions as of 12/09/2013  Medication Sig  . albuterol (PROVENTIL HFA;VENTOLIN HFA) 108 (90 BASE) MCG/ACT inhaler Inhale 2 puffs into the lungs every 6 (six) hours as needed.  . clobetasol (OLUX) 0.05 % topical foam Apply topically 2 (two) times daily.  . clonazePAM (KLONOPIN) 1 MG tablet Take 0.5 mg by mouth 3 (three) times daily as needed.   [name]. dextroamphetamine (DEXEDRINE SPANSULE) 15 MG 24 hr capsule Take 15 mg by mouth daily.   . Diclofenac Sodium 3 % GEL Diclofenac,baclofen, cyclobenzaprine, lidocaine gel to affected area twice daily  . Escitalopram Oxalate (LEXAPRO PO) Take 30 mg by mouth daily.   . furosemide (LASIX) 20 MG tablet Take 1 tablet (20 mg total) by mouth 2 (two) times daily.  . hydrocortisone (CORTEF) 5 MG tablet Take by mouth 10 mg in am and 5 mg in pm  . Lactulose 20 GM/30ML SOLN Take 30 mLs (20 g total) by mouth every 6 (six) hours as needed. For relief of constipation  . levothyroxine (SYNTHROID, LEVOTHROID) 88 MCG tablet TAKE 1 TABLET BY MOUTH EVERY DAY  . ondansetron (ZOFRAN-ODT) 4 MG disintegrating tablet Take 4 mg by mouth every 8 (eight) hours as needed for nausea or vomiting.  . tizanidine (ZANAFLEX) 6 MG capsule TAKE 1 CAPSULE (6 MG TOTAL) BY MOUTH 3 (THREE) TIMES DAILY AS NEEDED FOR MUSCLE SPASMS.  . [DISCONTINUED] albuterol (PROVENTIL HFA;VENTOLIN HFA) 108 (90 BASE) MCG/ACT inhaler Inhale 2 puffs into the lungs every 6 (six) hours as needed.  . [DISCONTINUED] tizanidine (ZANAFLEX) 6 MG capsule TAKE 1 CAPSULE (6 MG TOTAL) BY MOUTH 3 (THREE) TIMES DAILY AS NEEDED FOR MUSCLE SPASMS.  [name]. buPROPion (WELLBUTRIN XL) 150 MG 24 hr tablet Take 1 tablet by mouth every morning.  . lubiprostone (AMITIZA) 8 MCG capsule Take 18 mcg by mouth daily.  . pantoprazole (PROTONIX) 40 MG tablet Take 1 tablet  (40 mg total) by mouth daily. (Patient not taking: Reported on 12/09/2013)  . [DISCONTINUED] sulfamethoxazole-trimethoprim (BACTRIM DS) 800-160 MG per tablet Take 1 tablet by mouth 2 (two) times daily. (Patient not taking: Reported on 12/09/2013)

## 2013-12-09 NOTE — Assessment & Plan Note (Signed)
Well controlled on current regimen. Renal function is due, no changes today. °

## 2013-12-09 NOTE — Assessment & Plan Note (Signed)
I have addressed  BMI and recommended wt loss of 10% of body weigh over the next 6 months using a low glycemic index diet and regular exercise a minimum of 5 days per week.   

## 2013-12-09 NOTE — Assessment & Plan Note (Signed)

## 2013-12-09 NOTE — Assessment & Plan Note (Signed)
Resolved,  With no use of amitiza or lactulose currently .

## 2013-12-09 NOTE — Assessment & Plan Note (Signed)
She has missed several months of injections.  Resume here  monthly.  B12 level pending

## 2013-12-09 NOTE — Assessment & Plan Note (Signed)
Goal is LDL < 100 given hypertension,  Return for fasting labs

## 2013-12-09 NOTE — Assessment & Plan Note (Signed)
Secondary , due to prednisone vs dilaudid use.  Repeat cortisol and acth levesl have been ordered and patient reminded to suspend her previous 2 hydrocortisone dose s

## 2013-12-09 NOTE — Patient Instructions (Addendum)
Please return for  8 am fasting labs, per dr Renne Crigler'  You will need to suspend the hydrocortisone dose the night before and the morning of your blood tests   We will send the results to Dr Caprice Beaver instead or you going to [facility] for labs   This is  One version of a  "Low GI"  Diet:  It will still lower your blood sugars and allow you to lose 4 to 8  lbs  per month if you follow it carefully.  Your goal with exercise is a minimum of 30 minutes of aerobic exercise 5 days per week (Walking does not count once it becomes easy!)    All of the foods can be found at grocery stores and in bulk at [employer].  The Atkins protein bars and shakes are available in more varieties at [employer], [employer] and [place].     7 AM Breakfast:  Choose from the following:  Low carbohydrate Protein  Shakes (I recommend the EAS AdvantEdge "Carb Control" shakes  Or the low carb shakes by Atkins.    2.5 carbs   Arnold's "Sandwhich Thin"toasted  w/ peanut butter (no jelly: about 20 net carbs  "Bagel Thin" with cream cheese and salmon: about 20 carbs   a scrambled egg/bacon/cheese burrito made with Mission's "carb balance" whole wheat tortilla  (about 10 net carbs )   Avoid cereal and bananas, oatmeal and cream of wheat and grits. They are loaded with carbohydrates!   10 AM: high protein snack  Protein bar by Atkins (the snack size, under 200 cal, usually < 6 net carbs).    A stick of cheese:  Around 1 carb,  100 cal     Dannon Light n Fit [place] Yogurt  (80 cal, 8 carbs)  Other so called "protein bars" and Greek yogurts tend to be loaded with carbohydrates.  Remember, in food advertising, the word "energy" is synonymous for " carbohydrate."  Lunch:   A Sandwich using the bread choices listed, Can use any  Eggs,  lunchmeat, grilled meat or canned tuna), avocado, regular mayo/mustard  and cheese.  A Salad using blue cheese, ranch,  Goddess or vinagrette,  No croutons or "confetti" and no "candied nuts" but regular  nuts OK.   No pretzels or chips.  Pickles and miniature sweet peppers are a good low carb alternative that provide a "crunch"  The bread is the only source of carbohydrate in a sandwich and  can be decreased by trying some of these alternatives to traditional loaf bread  Joseph's makes a pita bread and a flat bread that are 50 cal and 4 net carbs available at [place] and [place].  This can be toasted to use with hummous as well  Toufayan makes a low carb flatbread that's 100 cal and 9 net carbs available at [employer] and [employer]J's makes 2 sizes of  Low carb whole wheat tortilla  (The large one is 210 cal and 6 net carbs) Avoid "Low fat dressings, as well as Barry Brunner and [place] dressings They are loaded with sugar!   3 PM/ Mid day  Snack:  Consider  1 ounce of  almonds, walnuts, pistachios, pecans, peanuts,  Macadamia nuts or a nut medley.  Avoid "granola"; the dried cranberries and raisins are loaded with carbohydrates. Mixed nuts as long as there are no raisins,  cranberries or dried fruit.    Try the prosciutto/mozzarella cheese sticks by Fiorruci  In deli /backery section  High protein      6 PM  Dinner:     Meat/fowl/fish with a green salad, and either broccoli, cauliflower, green beans, spinach, brussel sprouts or  Lima beans. DO NOT BREAD THE PROTEIN!!      There is a low carb pasta by Dreamfield's that is acceptable and tastes great: only 5 digestible carbs/serving.( All grocery stores but BJs carry it )  Try Hurley Cisco Angelo's chicken piccata or chicken or eggplant parm over low carb pasta.(Lowes and BJs)   Marjory Lies Sanchez's "Carnitas" (pulled pork, no sauce,  0 carbs) or his beef pot roast to make a dinner burrito (at BJ's)  Pesto over low carb pasta (bj's sells a good quality pesto in the center refrigerated section of the deli   Try satueeing  Cheral Marker with mushroooms  Whole wheat pasta is still full of digestible carbs and  Not as low in glycemic index as Dreamfield's.    Brown rice is still rice,  So skip the rice and noodles if you eat [place] or [place] (or at least limit to 1/2 cup)  9 PM snack :   Breyer's "low carb" fudgsicle or  ice cream bar (Carb Smart line), or  Weight Watcher's ice cream bar , or another "no sugar added" ice cream;  a serving of fresh berries/cherries with whipped cream   Cheese or DANNON'S LlGHT N FIT GREEK YOGURT  8 ounces of [place] unsweetened almond/cococunut milk    Avoid bananas, pineapple, grapes  and watermelon on a regular basis because they are high in sugar.  THINK OF THEM AS DESSERT  Remember that snack Substitutions should be less than 10 NET carbs per serving and meals < 20 carbs. Remember to subtract fiber grams to get the "net carbs."  Health Maintenance Adopting a healthy lifestyle and getting preventive care can go a long way to promote health and wellness. Talk with your health care provider about what schedule of regular examinations is right for you. This is a good chance for you to check in with your provider about disease prevention and staying healthy. In between checkups, there are plenty of things you can do on your own. Experts have done a lot of research about which lifestyle changes and preventive measures are most likely to keep you healthy. Ask your health care provider for more information. WEIGHT AND DIET  Eat a healthy diet  Be sure to include plenty of vegetables, fruits, low-fat dairy products, and lean protein.  Do not eat a lot of foods high in solid fats, added sugars, or salt.  Get regular exercise. This is one of the most important things you can do for your health.  Most adults should exercise for at least 150 minutes each week. The exercise should increase your heart rate and make you sweat (moderate-intensity exercise).  Most adults should also do strengthening exercises at least twice a week. This is in addition to the moderate-intensity exercise.  Maintain a healthy  weight  Body mass index (BMI) is a measurement that can be used to identify possible weight problems. It estimates body fat based on height and weight. Your health care provider can help determine your BMI and help you achieve or maintain a healthy weight.  For females 52 years of age and older:   A BMI below 18.5 is considered underweight.  A BMI of 18.5 to 24.9 is normal.  A BMI of 25 to 29.9 is considered overweight.  A BMI of 30 and above  is considered obese.  Watch levels of cholesterol and blood lipids  You should start having your blood tested for lipids and cholesterol at 60 years of age, then have this test every 5 years.  You may need to have your cholesterol levels checked more often if:  Your lipid or cholesterol levels are high.  You are older than 60 years of age.  You are at high risk for heart disease.  CANCER SCREENING   Lung Cancer  Lung cancer screening is recommended for adults 6-6 years old who are at high risk for lung cancer because of a history of smoking.  A yearly low-dose CT scan of the lungs is recommended for people who:  Currently smoke.  Have quit within the past 15 years.  Have at least a 30-pack-year history of smoking. A pack year is smoking an average of one pack of cigarettes a day for 1 year.  Yearly screening should continue until it has been 15 years since you quit.  Yearly screening should stop if you develop a health problem that would prevent you from having lung cancer treatment.  Breast Cancer  Practice breast self-awareness. This means understanding how your breasts normally appear and feel.  It also means doing regular breast self-exams. Let your health care provider know about any changes, no matter how small.  If you are in your 20s or 30s, you should have a clinical breast exam (CBE) by a health care provider every 1-3 years as part of a regular health exam.  If you are 76 or older, have a CBE every year. Also  consider having a breast X-ray (mammogram) every year.  If you have a family history of breast cancer, talk to your health care provider about genetic screening.  If you are at high risk for breast cancer, talk to your health care provider about having an MRI and a mammogram every year.  Breast cancer gene (BRCA) assessment is recommended for women who have family members with BRCA-related cancers. BRCA-related cancers include:  Breast.  Ovarian.  Tubal.  Peritoneal cancers.  Results of the assessment will determine the need for genetic counseling and BRCA1 and BRCA2 testing. Cervical Cancer Routine pelvic examinations to screen for cervical cancer are no longer recommended for nonpregnant women who are considered low risk for cancer of the pelvic organs (ovaries, uterus, and vagina) and who do not have symptoms. A pelvic examination may be necessary if you have symptoms including those associated with pelvic infections. Ask your health care provider if a screening pelvic exam is right for you.   The Pap test is the screening test for cervical cancer for women who are considered at risk.  If you had a hysterectomy for a problem that was not cancer or a condition that could lead to cancer, then you no longer need Pap tests.  If you are older than 65 years, and you have had normal Pap tests for the past 10 years, you no longer need to have Pap tests.  If you have had past treatment for cervical cancer or a condition that could lead to cancer, you need Pap tests and screening for cancer for at least 20 years after your treatment.  If you no longer get a Pap test, assess your risk factors if they change (such as having a new sexual partner). This can affect whether you should start being screened again.  Some women have medical problems that increase their chance of getting cervical cancer. If this is  the case for you, your health care provider may recommend more frequent screening and Pap  tests.  The human papillomavirus (HPV) test is another test that may be used for cervical cancer screening. The HPV test looks for the virus that can cause cell changes in the cervix. The cells collected during the Pap test can be tested for HPV.  The HPV test can be used to screen women 65 years of age and older. Getting tested for HPV can extend the interval between normal Pap tests from three to five years.  An HPV test also should be used to screen women of any age who have unclear Pap test results.  After 60 years of age, women should have HPV testing as often as Pap tests.  Colorectal Cancer  This type of cancer can be detected and often prevented.  Routine colorectal cancer screening usually begins at 60 years of age and continues through 60 years of age.  Your health care provider may recommend screening at an earlier age if you have risk factors for colon cancer.  Your health care provider may also recommend using home test kits to check for hidden blood in the stool.  A small camera at the end of a tube can be used to examine your colon directly (sigmoidoscopy or colonoscopy). This is done to check for the earliest forms of colorectal cancer.  Routine screening usually begins at age 11.  Direct examination of the colon should be repeated every 5-10 years through 60 years of age. However, you may need to be screened more often if early forms of precancerous polyps or small growths are found. Skin Cancer  Check your skin from head to toe regularly.  Tell your health care provider about any new moles or changes in moles, especially if there is a change in a mole's shape or color.  Also tell your health care provider if you have a mole that is larger than the size of a pencil eraser.  Always use sunscreen. Apply sunscreen liberally and repeatedly throughout the day.  Protect yourself by wearing long sleeves, pants, a wide-brimmed hat, and sunglasses whenever you are  outside. HEART DISEASE, DIABETES, AND HIGH BLOOD PRESSURE   Have your blood pressure checked at least every 1-2 years. High blood pressure causes heart disease and increases the risk of stroke.  If you are between 62 years and 43 years old, ask your health care provider if you should take aspirin to prevent strokes.  Have regular diabetes screenings. This involves taking a blood sample to check your fasting blood sugar level.  If you are at a normal weight and have a low risk for diabetes, have this test once every three years after 60 years of age.  If you are overweight and have a high risk for diabetes, consider being tested at a younger age or more often. PREVENTING INFECTION  Hepatitis B  If you have a higher risk for hepatitis B, you should be screened for this virus. You are considered at high risk for hepatitis B if:  You were born in a country where hepatitis B is common. Ask your health care provider which countries are considered high risk.  Your parents were born in a high-risk country, and you have not been immunized against hepatitis B (hepatitis B vaccine).  You have HIV or AIDS.  You use needles to inject street drugs.  You live with someone who has hepatitis B.  You have had sex with someone who has  hepatitis B.  You get hemodialysis treatment.  You take certain medicines for conditions, including cancer, organ transplantation, and autoimmune conditions. Hepatitis C  Blood testing is recommended for:  Everyone born from [birth year] through 1967.  Anyone with known risk factors for hepatitis C. Sexually transmitted infections (STIs)  You should be screened for sexually transmitted infections (STIs) including gonorrhea and chlamydia if:  You are sexually active and are younger than 60 years of age.  You are older than 60 years of age and your health care provider tells you that you are at risk for this type of infection.  Your sexual activity has changed since  you were last screened and you are at an increased risk for chlamydia or gonorrhea. Ask your health care provider if you are at risk.  If you do not have HIV, but are at risk, it may be recommended that you take a prescription medicine daily to prevent HIV infection. This is called pre-exposure prophylaxis (PrEP). You are considered at risk if:  You are sexually active and do not regularly use condoms or know the HIV status of your partner(s).  You take drugs by injection.  You are sexually active with a partner who has HIV. Talk with your health care provider about whether you are at high risk of being infected with HIV. If you choose to begin PrEP, you should first be tested for HIV. You should then be tested every 3 months for as long as you are taking PrEP.  PREGNANCY   If you are premenopausal and you may become pregnant, ask your health care provider about preconception counseling.  If you may become pregnant, take 400 to 800 micrograms (mcg) of folic acid every day.  If you want to prevent pregnancy, talk to your health care provider about birth control (contraception). OSTEOPOROSIS AND MENOPAUSE   Osteoporosis is a disease in which the bones lose minerals and strength with aging. This can result in serious bone fractures. Your risk for osteoporosis can be identified using a bone density scan.  If you are 46 years of age or older, or if you are at risk for osteoporosis and fractures, ask your health care provider if you should be screened.  Ask your health care provider whether you should take a calcium or vitamin D supplement to lower your risk for osteoporosis.  Menopause may have certain physical symptoms and risks.  Hormone replacement therapy may reduce some of these symptoms and risks. Talk to your health care provider about whether hormone replacement therapy is right for you.  HOME CARE INSTRUCTIONS   Schedule regular health, dental, and eye exams.  Stay current with  your immunizations.   Do not use any tobacco products including cigarettes, chewing tobacco, or electronic cigarettes.  If you are pregnant, do not drink alcohol.  If you are breastfeeding, limit how much and how often you drink alcohol.  Limit alcohol intake to no more than 1 drink per day for nonpregnant women. One drink equals 12 ounces of beer, 5 ounces of wine, or 1 ounces of hard liquor.  Do not use street drugs.  Do not share needles.  Ask your health care provider for help if you need support or information about quitting drugs.  Tell your health care provider if you often feel depressed.  Tell your health care provider if you have ever been abused or do not feel safe at home. Document Released: 07/05/2010 Document Revised: 05/06/2013 Document Reviewed: 11/21/2012 ExitCare Patient Information 2015 [employer]. This information is not intended to replace advice given to you by your health care provider. Make sure you discuss any questions you have with your health care provider.  

## 2013-12-09 NOTE — Progress Notes (Signed)
Pre-visit discussion using our clinic review tool. No additional management support is needed unless otherwise documented below in the visit note.  

## 2013-12-10 ENCOUNTER — Other Ambulatory Visit: Payer: Self-pay

## 2013-12-11 ENCOUNTER — Ambulatory Visit (INDEPENDENT_AMBULATORY_CARE_PROVIDER_SITE_OTHER): Payer: 59 | Admitting: Psychology

## 2013-12-11 ENCOUNTER — Other Ambulatory Visit (INDEPENDENT_AMBULATORY_CARE_PROVIDER_SITE_OTHER): Payer: 59

## 2013-12-11 DIAGNOSIS — E038 Other specified hypothyroidism: Secondary | ICD-10-CM

## 2013-12-11 DIAGNOSIS — R5383 Other fatigue: Secondary | ICD-10-CM

## 2013-12-11 DIAGNOSIS — F32A Depression, unspecified: Secondary | ICD-10-CM

## 2013-12-11 DIAGNOSIS — E034 Atrophy of thyroid (acquired): Secondary | ICD-10-CM

## 2013-12-11 DIAGNOSIS — E669 Obesity, unspecified: Secondary | ICD-10-CM

## 2013-12-11 DIAGNOSIS — E559 Vitamin D deficiency, unspecified: Secondary | ICD-10-CM

## 2013-12-11 DIAGNOSIS — F332 Major depressive disorder, recurrent severe without psychotic features: Secondary | ICD-10-CM

## 2013-12-11 DIAGNOSIS — F329 Major depressive disorder, single episode, unspecified: Secondary | ICD-10-CM

## 2013-12-11 LAB — LIPID PANEL
CHOLESTEROL: 221 mg/dL — AB (ref 0–200)
HDL: 74.5 mg/dL (ref >39.00)
LDL Cholesterol: 126 mg/dL — ABNORMAL HIGH (ref 0–99)
NONHDL: 146.5
Total CHOL/HDL Ratio: 3
Triglycerides: 105 mg/dL (ref 0.0–149.0)
VLDL: 21 mg/dL (ref 0.0–40.0)

## 2013-12-11 LAB — COMPREHENSIVE METABOLIC PANEL
ALBUMIN: 3.8 g/dL (ref 3.5–5.2)
ALK PHOS: 44 U/L (ref 39–117)
ALT: 24 U/L (ref 0–35)
AST: 16 U/L (ref 0–37)
BUN: 20 mg/dL (ref 6–23)
CO2: 27 meq/L (ref 19–32)
Calcium: 9.1 mg/dL (ref 8.4–10.5)
Chloride: 104 mEq/L (ref 96–112)
Creatinine, Ser: 0.9 mg/dL (ref 0.4–1.2)
GFR: 68.59 mL/min (ref >60.00)
GLUCOSE: 90 mg/dL (ref 70–99)
POTASSIUM: 4.8 meq/L (ref 3.5–5.1)
Sodium: 138 mEq/L (ref 135–145)
TOTAL PROTEIN: 6.4 g/dL (ref 6.0–8.3)
Total Bilirubin: 0.5 mg/dL (ref 0.2–1.2)

## 2013-12-11 LAB — CBC WITH DIFFERENTIAL/PLATELET
BASOS ABS: 0.1 10*3/uL (ref 0.0–0.1)
Basophils Relative: 0.7 % (ref 0.0–3.0)
EOS PCT: 2.8 % (ref 0.0–5.0)
Eosinophils Absolute: 0.2 10*3/uL (ref 0.0–0.7)
HEMATOCRIT: 40.4 % (ref 36.0–46.0)
Hemoglobin: 13.1 g/dL (ref 12.0–15.0)
LYMPHS ABS: 2.8 10*3/uL (ref 0.7–4.0)
Lymphocytes Relative: 35.9 % (ref 12.0–46.0)
MCHC: 32.6 g/dL (ref 30.0–36.0)
MCV: 91.3 fl (ref 78.0–100.0)
MONO ABS: 0.6 10*3/uL (ref 0.1–1.0)
Monocytes Relative: 7.2 % (ref 3.0–12.0)
Neutro Abs: 4.2 10*3/uL (ref 1.4–7.7)
Neutrophils Relative %: 53.4 % (ref 43.0–77.0)
PLATELETS: 296 10*3/uL (ref 150.0–400.0)
RBC: 4.42 Mil/uL (ref 3.87–5.11)
RDW: 14.1 % (ref 11.5–15.5)
WBC: 7.9 10*3/uL (ref 4.0–10.5)

## 2013-12-11 LAB — TSH: TSH: 1.53 u[IU]/mL (ref 0.35–4.50)

## 2013-12-11 LAB — VITAMIN D 25 HYDROXY (VIT D DEFICIENCY, FRACTURES): VITD: 18.54 ng/mL — AB (ref 30.00–100.00)

## 2013-12-11 LAB — VITAMIN B12: VITAMIN B 12: 377 pg/mL (ref 211–911)

## 2013-12-12 DIAGNOSIS — E559 Vitamin D deficiency, unspecified: Secondary | ICD-10-CM | POA: Insufficient documentation

## 2013-12-12 MED ORDER — ERGOCALCIFEROL 1.25 MG (50000 UT) PO CAPS: 50000.0000 [IU] | ORAL_CAPSULE | ORAL | Status: DC

## 2013-12-12 NOTE — Assessment & Plan Note (Signed)
Level was 18 in Dec     Drisdol 50K weekly x 3 months

## 2013-12-12 NOTE — Addendum Note (Signed)
Addended by: Sherlene ShamsULLO, Kameela Leipold L on: 12/12/2013 03:06 PM   Modules accepted: Orders

## 2013-12-13 ENCOUNTER — Encounter: Payer: Self-pay | Admitting: *Deleted

## 2013-12-13 ENCOUNTER — Ambulatory Visit: Payer: 59 | Admitting: Psychology

## 2013-12-17 ENCOUNTER — Telehealth: Payer: Self-pay | Admitting: Internal Medicine

## 2013-12-17 NOTE — Telephone Encounter (Signed)
Please ask patient to come in at 4:30 tomorrow

## 2013-12-17 NOTE — Telephone Encounter (Signed)
Renee from PT at [facility] called to notify MD that patient had stated during therapy she had thought about committing suicide, on 12/16/13 but called the suicide  Hot-line ,  PT just wanted to make us aware. Patient stated she is having a rough time.

## 2013-12-17 NOTE — Telephone Encounter (Signed)
Please schedule patient. I could not reach patient by phone

## 2013-12-18 ENCOUNTER — Ambulatory Visit (INDEPENDENT_AMBULATORY_CARE_PROVIDER_SITE_OTHER): Payer: 59 | Admitting: Psychology

## 2013-12-18 ENCOUNTER — Ambulatory Visit (INDEPENDENT_AMBULATORY_CARE_PROVIDER_SITE_OTHER): Payer: 59 | Admitting: Internal Medicine

## 2013-12-18 ENCOUNTER — Encounter: Payer: Self-pay | Admitting: Internal Medicine

## 2013-12-18 VITALS — BP 134/76 | HR 95 | Temp 98.4°F | Resp 18 | Ht 64.25 in | Wt 236.0 lb

## 2013-12-18 DIAGNOSIS — F32A Depression, unspecified: Secondary | ICD-10-CM

## 2013-12-18 DIAGNOSIS — F333 Major depressive disorder, recurrent, severe with psychotic symptoms: Secondary | ICD-10-CM

## 2013-12-18 DIAGNOSIS — F329 Major depressive disorder, single episode, unspecified: Secondary | ICD-10-CM

## 2013-12-18 DIAGNOSIS — F332 Major depressive disorder, recurrent severe without psychotic features: Secondary | ICD-10-CM

## 2013-12-18 NOTE — Patient Instructions (Signed)
I am referring you to Tamsen RoersArtie Kapur MD for psychiatric care  Please call if you feel that you are going to harm yourself

## 2013-12-18 NOTE — Progress Notes (Signed)
Pre visit review using our clinic review tool, if applicable. No additional management support is needed unless otherwise documented below in the visit note. 

## 2013-12-18 NOTE — Progress Notes (Signed)
Patient ID: [name], female   DOB: [birth year], 58 y.o.   MRN: [id number]  Patient Active Problem List   Diagnosis Date Noted  . Hypovitaminosis D 12/12/2013  . Encounter for preventive health examination 12/09/2013  . Altered mental status 07/16/2013  . Constipation 04/22/2013  . Unspecified gastritis and gastroduodenitis without mention of hemorrhage 04/22/2013  . Adrenal insufficiency 04/01/2013  . Lumbago 08/17/2012  . History of acute pancreatitis 07/11/2012  . Hospital discharge follow-up 06/29/2012  . Colon polyp, hyperplastic 04/30/2012  . Vestibular dizziness 01/31/2012  . B12 deficiency 04/11/2011  . Fatty liver disease, nonalcoholic 86/57/8469  . Obesity, Class III, BMI 40-49.9 (morbid obesity) 04/11/2011  . Tobacco abuse   . Depression   . Hypothyroidism   . Hyperlipidemia   . Hypertension   . Arthritis   . Atopic dermatitis   . Anxiety     Subjective:  CC:   Chief Complaint  Patient presents with  . Depression    HPI:   [name] is a 58 y.o. female who presents for recent reference to suicidal n.  Patietn is accompanied by her husband.  Patient casually mentioned to physical therapist 2 days ago that she had contemplated suicide the day before,  PT called [place] .  Patient has not contacted her psychiatrist or therapist,  Patient history:  One hour after seeing Pervis Hocking  She started worrying that she was goig to stab herself,  Called suicide hotline,  Discussed her feelings, felt better then  went to sleep Trigger was  Deteriorating relationship with younger sister   Also medication changes: stellazine resumed,  clonazepam reduced,  wellbutrin stopped 3 weeks ago .  Doesn't like the NP that is treating her bc she will not treat her for ADD unless she has the testing for it which is occurring n January    Past Medical History  Diagnosis Date  . Tobacco abuse   . Depression   . Thyroid disease   . Hyperlipidemia   . Hypertension   . Arthritis   .  Atopic dermatitis     scalp  . Anxiety     Past Surgical History  Procedure Laterality Date  . Rotator cuff repair    . Lumbar disc surgery         The following portions of the patient's history were reviewed and updated as appropriate: Allergies, current medications, and problem list.    Review of Systems:   Patient denies headache, fevers, malaise, unintentional weight loss, skin rash, eye pain, sinus congestion and sinus pain, sore throat, dysphagia,  hemoptysis , cough, dyspnea, wheezing, chest pain, palpitations, orthopnea, edema, abdominal pain, nausea, melena, diarrhea, constipation, flank pain, dysuria, hematuria, urinary  Frequency, nocturia, numbness, tingling, seizures,  Focal weakness, Loss of consciousness,  Tremor, insomnia, depression, anxiety, and suicidal ideation.     History   Social History  . Marital Status: Married    Spouse Name: N/A    Number of Children: N/A  . Years of Education: N/A   Occupational History  . Not on file.   Social History Main Topics  . Smoking status: Light Tobacco Smoker -- 0.40 packs/day    Types: Cigarettes  . Smokeless tobacco: Never Used  . Alcohol Use: Yes     Comment: occasional  . Drug Use: No  . Sexual Activity: Not on file   Other Topics Concern  . Not on file   Social History Narrative    Objective:  Filed Vitals:   12/18/13  1650  BP: 134/76  Pulse: 95  Temp: 98.4 F (36.9 C)  Resp: 18     General appearance: alert, cooperative and appears stated age Ears: normal TM's and external ear canals both ears Throat: lips, mucosa, and tongue normal; teeth and gums normal Neck: no adenopathy, no carotid bruit, supple, symmetrical, trachea midline and thyroid not enlarged, symmetric, no tenderness/mass/nodules Back: symmetric, no curvature. ROM normal. No CVA tenderness. Lungs: clear to auscultation bilaterally Heart: regular rate and rhythm, S1, S2 normal, no murmur, click, rub or gallop Abdomen: soft,  non-tender; bowel sounds normal; no masses,  no organomegaly Pulses: 2+ and symmetric Skin: Skin color, texture, turgor normal. No rashes or lesions Lymph nodes: Cervical, supraclavicular, and axillary nodes normal.  Assessment and Plan:  Depression Aggravated by  Suspension of wellbutrin several weeks ago. She is able to contract for safety.  She is very frustrated by the psychiatric care she is receiving from  Dr Arvil Persons nurse practitioner, and has never met with Dr Caprice Beaver.  Her medications have been changed without close follow up.   Discussed referral to Dr Nicolasa Ducking .     Updated Medication List Outpatient Encounter Prescriptions as of 12/18/2013  Medication Sig  . albuterol (PROVENTIL HFA;VENTOLIN HFA) 108 (90 BASE) MCG/ACT inhaler Inhale 2 puffs into the lungs every 6 (six) hours as needed.  . clobetasol (OLUX) 0.05 % topical foam Apply topically 2 (two) times daily.  . clonazePAM (KLONOPIN) 1 MG tablet Take 0.5 mg by mouth 3 (three) times daily as needed.   . Diclofenac Sodium 3 % GEL Diclofenac,baclofen, cyclobenzaprine, lidocaine gel to affected area twice daily  . ergocalciferol (DRISDOL) 50000 UNITS capsule Take 1 capsule (50,000 Units total) by mouth once a week.  . Escitalopram Oxalate (LEXAPRO PO) Take 30 mg by mouth daily.   . furosemide (LASIX) 20 MG tablet Take 1 tablet (20 mg total) by mouth 2 (two) times daily.  . hydrocortisone (CORTEF) 5 MG tablet Take by mouth 10 mg in am and 5 mg in pm  . Lactulose 20 GM/30ML SOLN Take 30 mLs (20 g total) by mouth every 6 (six) hours as needed. For relief of constipation  . levothyroxine (SYNTHROID, LEVOTHROID) 88 MCG tablet TAKE 1 TABLET BY MOUTH EVERY DAY  . lubiprostone (AMITIZA) 8 MCG capsule Take 18 mcg by mouth daily.  . ondansetron (ZOFRAN-ODT) 4 MG disintegrating tablet Take 4 mg by mouth every 8 (eight) hours as needed for nausea or vomiting.  . pantoprazole (PROTONIX) 40 MG tablet Take 1 tablet (40 mg total) by mouth  daily.  . tizanidine (ZANAFLEX) 6 MG capsule TAKE 1 CAPSULE (6 MG TOTAL) BY MOUTH 3 (THREE) TIMES DAILY AS NEEDED FOR MUSCLE SPASMS.  [name] benztropine (COGENTIN) 1 MG tablet Take 1 mg by mouth 2 (two) times daily.  [name] buPROPion (WELLBUTRIN XL) 150 MG 24 hr tablet Take 1 tablet by mouth every morning.  [name] dextroamphetamine (DEXEDRINE SPANSULE) 15 MG 24 hr capsule Take 15 mg by mouth daily.      Orders Placed This Encounter  Procedures  . Ambulatory referral to Psychiatry    No Follow-up on file.

## 2013-12-21 ENCOUNTER — Encounter: Payer: Self-pay | Admitting: Internal Medicine

## 2013-12-21 NOTE — Assessment & Plan Note (Signed)
Aggravated by  Suspension of wellbutrin several weeks ago. She is able to contract for safety.  She is very frustrated by the psychiatric care she is receiving from  Dr Arvil Persons nurse practitioner, and has never met with Dr Caprice Beaver.  Her medications have been changed without close follow up.   Discussed referral to Dr Nicolasa Ducking .

## 2013-12-25 ENCOUNTER — Ambulatory Visit: Payer: 59 | Admitting: Psychology

## 2014-01-08 ENCOUNTER — Ambulatory Visit: Payer: 59 | Admitting: Psychology

## 2014-01-10 ENCOUNTER — Ambulatory Visit (INDEPENDENT_AMBULATORY_CARE_PROVIDER_SITE_OTHER): Payer: 59 | Admitting: Psychology

## 2014-01-10 DIAGNOSIS — F900 Attention-deficit hyperactivity disorder, predominantly inattentive type: Secondary | ICD-10-CM

## 2014-01-10 DIAGNOSIS — F333 Major depressive disorder, recurrent, severe with psychotic symptoms: Secondary | ICD-10-CM

## 2014-01-15 ENCOUNTER — Ambulatory Visit: Payer: 59 | Admitting: Psychology

## 2014-01-22 ENCOUNTER — Ambulatory Visit (INDEPENDENT_AMBULATORY_CARE_PROVIDER_SITE_OTHER): Payer: 59 | Admitting: Psychology

## 2014-01-22 DIAGNOSIS — F332 Major depressive disorder, recurrent severe without psychotic features: Secondary | ICD-10-CM

## 2014-01-23 ENCOUNTER — Encounter: Payer: Self-pay | Admitting: Internal Medicine

## 2014-01-24 ENCOUNTER — Ambulatory Visit: Payer: 59 | Admitting: Psychology

## 2014-01-29 ENCOUNTER — Ambulatory Visit (INDEPENDENT_AMBULATORY_CARE_PROVIDER_SITE_OTHER): Payer: 59 | Admitting: Psychology

## 2014-01-29 ENCOUNTER — Ambulatory Visit: Payer: 59 | Admitting: Psychology

## 2014-01-29 DIAGNOSIS — F332 Major depressive disorder, recurrent severe without psychotic features: Secondary | ICD-10-CM

## 2014-02-05 ENCOUNTER — Ambulatory Visit (INDEPENDENT_AMBULATORY_CARE_PROVIDER_SITE_OTHER): Payer: 59 | Admitting: Psychology

## 2014-02-05 DIAGNOSIS — F332 Major depressive disorder, recurrent severe without psychotic features: Secondary | ICD-10-CM

## 2014-02-12 ENCOUNTER — Ambulatory Visit: Payer: 59 | Admitting: Psychology

## 2014-02-12 ENCOUNTER — Ambulatory Visit (INDEPENDENT_AMBULATORY_CARE_PROVIDER_SITE_OTHER): Payer: 59 | Admitting: Psychology

## 2014-02-12 DIAGNOSIS — F332 Major depressive disorder, recurrent severe without psychotic features: Secondary | ICD-10-CM

## 2014-02-19 ENCOUNTER — Ambulatory Visit (INDEPENDENT_AMBULATORY_CARE_PROVIDER_SITE_OTHER): Payer: 59 | Admitting: Psychology

## 2014-02-19 DIAGNOSIS — F332 Major depressive disorder, recurrent severe without psychotic features: Secondary | ICD-10-CM

## 2014-02-21 ENCOUNTER — Ambulatory Visit (INDEPENDENT_AMBULATORY_CARE_PROVIDER_SITE_OTHER): Payer: 59 | Admitting: Psychology

## 2014-02-21 DIAGNOSIS — F411 Generalized anxiety disorder: Secondary | ICD-10-CM

## 2014-02-21 DIAGNOSIS — F902 Attention-deficit hyperactivity disorder, combined type: Secondary | ICD-10-CM

## 2014-02-21 DIAGNOSIS — F332 Major depressive disorder, recurrent severe without psychotic features: Secondary | ICD-10-CM

## 2014-02-26 ENCOUNTER — Ambulatory Visit (INDEPENDENT_AMBULATORY_CARE_PROVIDER_SITE_OTHER): Payer: 59 | Admitting: Psychology

## 2014-02-26 DIAGNOSIS — F332 Major depressive disorder, recurrent severe without psychotic features: Secondary | ICD-10-CM

## 2014-03-03 ENCOUNTER — Encounter: Payer: Self-pay | Admitting: Internal Medicine

## 2014-03-03 ENCOUNTER — Ambulatory Visit (INDEPENDENT_AMBULATORY_CARE_PROVIDER_SITE_OTHER): Payer: 59 | Admitting: Internal Medicine

## 2014-03-03 VITALS — BP 128/86 | HR 88 | Temp 98.3°F | Resp 16 | Ht 64.25 in | Wt 240.8 lb

## 2014-03-03 DIAGNOSIS — F32A Depression, unspecified: Secondary | ICD-10-CM

## 2014-03-03 DIAGNOSIS — F329 Major depressive disorder, single episode, unspecified: Secondary | ICD-10-CM

## 2014-03-03 DIAGNOSIS — E2740 Unspecified adrenocortical insufficiency: Secondary | ICD-10-CM

## 2014-03-03 MED ORDER — ESCITALOPRAM OXALATE 10 MG PO TABS: 30.0000 mg | ORAL_TABLET | Freq: Every day | ORAL | Status: DC

## 2014-03-03 MED ORDER — ESCITALOPRAM OXALATE 20 MG PO TABS: 20.0000 mg | ORAL_TABLET | Freq: Every day | ORAL | Status: DC

## 2014-03-03 MED ORDER — HYDROCORTISONE 5 MG PO TABS: ORAL_TABLET | ORAL | Status: DC

## 2014-03-03 MED ORDER — ONDANSETRON 4 MG PO TBDP: 4.0000 mg | ORAL_TABLET | Freq: Three times a day (TID) | ORAL | Status: DC | PRN

## 2014-03-03 NOTE — Assessment & Plan Note (Signed)
I have addressed  BMI and recommended wt loss of 10% of body weigh over the next 6 months using a low glycemic index diet and regular exercise a minimum of 5 days per week.   

## 2014-03-03 NOTE — Patient Instructions (Addendum)
Your weight gain is due to too LITTLE ACTIVITY and NOT EATING FREQUENTLY ENOUGH!  This is  One version of a  "Low GI"  Diet:  It will still lower your blood sugars and allow you to lose 4 to 8  lbs  per month if you follow it carefully.  Your goal with exercise is a minimum of 30 minutes of aerobic exercise 5 days per week (Walking does not count once it becomes easy!)    All of the foods can be found at grocery stores and in bulk at [employer].  The Atkins protein bars and shakes are available in more varieties at Target, WalMart and [employer].     7 AM Breakfast:  Choose from the following:  Low carbohydrate Protein  Shakes (I recommend the EAS AdvantEdge "Carb Control" shakes  Or the low carb shakes by Atkins.    2.5 carbs   Arnold's "Sandwhich Thin"toasted  w/ peanut butter (no jelly: about 20 net carbs  "Bagel Thin" with cream cheese and salmon: about 20 carbs   a scrambled egg/bacon/cheese burrito made with Mission's "carb balance" whole wheat tortilla  (about 10 net carbs )   Avoid cereal and bananas, oatmeal and cream of wheat and grits. They are loaded with carbohydrates!   10 AM: high protein snack  Protein bar by Atkins (the snack size, under 200 cal, usually < 6 net carbs). Or KIND  Bars that are 5 net carbs or less     A stick of cheese:  Around 1 carb,  100 cal     Dannon Light n Fit [place] Yogurt  (80 cal, 8 carbs)  Other so called "protein bars" and Greek yogurts tend to be loaded with carbohydrates.  Remember, in food advertising, the word "energy" is synonymous for " carbohydrate."  Lunch:   A Sandwich using the bread choices listed, Can use any  Eggs,  lunchmeat, grilled meat or canned tuna), avocado, regular mayo/mustard  and cheese.  A Salad using blue cheese, ranch,  Goddess or vinagrette,  No croutons or "confetti" and no "candied nuts" but regular nuts OK.   No pretzels or chips.  Pickles and miniature sweet peppers are a good low carb alternative that provide a  "crunch"  The bread is the only source of carbohydrate in a sandwich and  can be decreased by trying some of these alternatives to traditional loaf bread  Joseph's makes a pita bread and a flat bread that are 50 cal and 4 net carbs available at BJs and WalMart.  This can be toasted to use with hummous as well  Toufayan makes a low carb flatbread that's 100 cal and 9 net carbs available at [employer] and [employer] makes 2 sizes of  Low carb whole wheat tortilla  (The large one is 210 cal and 6 net carbs) Avoid "Low fat dressings, as well as Reyne Dumas and [address] dressings They are loaded with sugar!   3 PM/ Mid day  Snack:  Consider  1 ounce of  almonds, walnuts, pistachios, pecans, peanuts,  Macadamia nuts or a nut medley.  Avoid "granola"; the dried cranberries and raisins are loaded with carbohydrates. Mixed nuts as long as there are no raisins,  cranberries or dried fruit.     6 PM  Dinner:     Meat/fowl/fish with a green salad, and either broccoli, cauliflower, green beans, spinach, brussel sprouts or  Lima beans. DO NOT BREAD THE PROTEIN!!  There is a low carb pasta by Dreamfield's that is acceptable and tastes great: only 5 digestible carbs/serving.( All grocery stores but BJs carry it )  Try Kai LevinsMichel Angelo's chicken piccata or chicken or eggplant parm over low carb pasta.(Lowes and BJs)   Clifton CustardAaron Sanchez's "Carnitas" (pulled pork, no sauce,  0 carbs) or his beef pot roast to make a dinner burrito (at BJ's)  Pesto over low carb pasta (bj's sells a good quality pesto in the center refrigerated section of the deli   Whole wheat pasta is still full of digestible carbs and  Not as low in glycemic index as Dreamfield's.   Brown rice is still rice,  So skip the rice and noodles if you eat Congohinese or [place]hai (or at least limit to 1/2 cup)  9 PM snack :   Breyer's "low carb" fudgsicle or  ice cream bar (Carb Smart line), or  Weight Watcher's ice cream bar , or another "no sugar added"  ice cream;  a serving of fresh berries/cherries with whipped cream   Cheese or DANNON'S LlGHT N FIT [place] YOGURT  Avoid bananas, pineapple, grapes  and watermelon on a regular basis because they are high in sugar.  THINK OF THEM AS DESSERT  Remember that snack Substitutions should be less than 10 NET carbs per serving and meals < 20 carbs. Remember to subtract fiber grams to get the "net carbs."

## 2014-03-03 NOTE — Progress Notes (Signed)
Patient ID: [name] Albritton, female   DOB: 03/20/1955, 59 y.o.   MRN: [id number]  Patient Active Problem List   Diagnosis Date Noted  . Hypovitaminosis D 12/12/2013  . Encounter for preventive health examination 12/09/2013  . Constipation 04/22/2013  . Unspecified gastritis and gastroduodenitis without mention of hemorrhage 04/22/2013  . Adrenal insufficiency 04/01/2013  . Lumbago 08/17/2012  . History of acute pancreatitis 07/11/2012  . Hospital discharge follow-up 06/29/2012  . Colon polyp, hyperplastic 04/30/2012  . Vestibular dizziness 01/31/2012  . B12 deficiency 04/11/2011  . Fatty liver disease, nonalcoholic 04/11/2011  . Obesity, Class III, BMI 40-49.9 (morbid obesity) 04/11/2011  . Tobacco abuse   . Depression   . Hypothyroidism   . Hyperlipidemia   . Hypertension   . Arthritis   . Atopic dermatitis   . Anxiety     Subjective:  CC:   Chief Complaint  Patient presents with  . Follow-up    medication for antidepressant. See if MD will fill lexapro    HPI:   [name] Ebron is a 59 y.o. female who presents for  Medication refill.  Patient has been having trouble securing psychiatric follow up due to insurance noncoverage,  The psychiatrist she felt comfortable with retired and she has not had a good rapport with the NP that has taken his place. She is requesting a refill on the lexapro  Weight gain;  She has been steadily gaining weight and blames her medications .  However, she has not been exercising and has not been following a specific diet.  She often skips breakfast and eats only twice daily    Past Medical History  Diagnosis Date  . Tobacco abuse   . Depression   . Thyroid disease   . Hyperlipidemia   . Hypertension   . Arthritis   . Atopic dermatitis     scalp  . Anxiety     Past Surgical History  Procedure Laterality Date  . Rotator cuff repair    . Lumbar disc surgery         The following portions of the patient's history were reviewed and  updated as appropriate: Allergies, current medications, and problem list.    Review of Systems:   Patient denies headache, fevers, malaise, unintentional weight loss, skin rash, eye pain, sinus congestion and sinus pain, sore throat, dysphagia,  hemoptysis , cough, dyspnea, wheezing, chest pain, palpitations, orthopnea, edema, abdominal pain, nausea, melena, diarrhea, constipation, flank pain, dysuria, hematuria, urinary  Frequency, nocturia, numbness, tingling, seizures,  Focal weakness, Loss of consciousness,  Tremor, insomnia, depression, anxiety, and suicidal ideation.     History   Social History  . Marital Status: Married    Spouse Name: N/A  . Number of Children: N/A  . Years of Education: N/A   Occupational History  . Not on file.   Social History Main Topics  . Smoking status: Light Tobacco Smoker -- 0.40 packs/day    Types: Cigarettes  . Smokeless tobacco: Never Used  . Alcohol Use: Yes     Comment: occasional  . Drug Use: No  . Sexual Activity: Not on file   Other Topics Concern  . Not on file   Social History Narrative    Objective:  Filed Vitals:   03/03/14 1611  BP: 128/86  Pulse: 88  Temp: 98.3 F (36.8 C)  Resp: 16     General appearance: alert, cooperative and appears stated age Ears: normal TM's and external ear canals both ears Throat: lips, mucosa,  and tongue normal; teeth and gums normal Neck: no adenopathy, no carotid bruit, supple, symmetrical, trachea midline and thyroid not enlarged, symmetric, no tenderness/mass/nodules Back: symmetric, no curvature. ROM normal. No CVA tenderness. Lungs: clear to auscultation bilaterally Heart: regular rate and rhythm, S1, S2 normal, no murmur, click, rub or gallop Abdomen: soft, non-tender; bowel sounds normal; no masses,  no organomegaly Pulses: 2+ and symmetric Skin: Skin color, texture, turgor normal. No rashes or lesions Lymph nodes: Cervical, supraclavicular, and axillary nodes  normal.  Assessment and Plan:  Obesity, Class III, BMI 40-49.9 (morbid obesity) I have addressed  BMI and recommended wt loss of 10% of body weigh over the next 6 months using a low glycemic index diet and regular exercise a minimum of 5 days per week.     Adrenal insufficiency She continues to take hydrocortisone  For AI   Depression  Have refilled her lexapro until she can see psychiatry   A total of 25 minutes of face to face time was spent with patient more than half of which was spent in counselling and coordination of care `1 Updated Medication List Outpatient Encounter Prescriptions as of 03/03/2014  Medication Sig  . albuterol (PROVENTIL HFA;VENTOLIN HFA) 108 (90 BASE) MCG/ACT inhaler Inhale 2 puffs into the lungs every 6 (six) hours as needed.  . benztropine (COGENTIN) 1 MG tablet Take 1 mg by mouth 2 (two) times daily.  . clonazePAM (KLONOPIN) 1 MG tablet Take 0.5 mg by mouth 3 (three) times daily as needed.   . ergocalciferol (DRISDOL) 50000 UNITS capsule Take 1 capsule (50,000 Units total) by mouth once a week.  . escitalopram (LEXAPRO) 20 MG tablet Take 1 tablet (20 mg total) by mouth daily.  . furosemide (LASIX) 20 MG tablet Take 1 tablet (20 mg total) by mouth 2 (two) times daily. (Patient taking differently: Take 20 mg by mouth 2 (two) times daily as needed. )  . levothyroxine (SYNTHROID, LEVOTHROID) 88 MCG tablet TAKE 1 TABLET BY MOUTH EVERY DAY  . ondansetron (ZOFRAN-ODT) 4 MG disintegrating tablet Take 1 tablet (4 mg total) by mouth every 8 (eight) hours as needed for nausea or vomiting.  . tizanidine (ZANAFLEX) 6 MG capsule TAKE 1 CAPSULE (6 MG TOTAL) BY MOUTH 3 (THREE) TIMES DAILY AS NEEDED FOR MUSCLE SPASMS.  . [DISCONTINUED] escitalopram (LEXAPRO) 10 MG tablet Take 3 tablets (30 mg total) by mouth daily.  . [DISCONTINUED] Escitalopram Oxalate (LEXAPRO PO) Take 30 mg by mouth daily.   . [DISCONTINUED] ondansetron (ZOFRAN-ODT) 4 MG disintegrating tablet Take 4 mg  by mouth every 8 (eight) hours as needed for nausea or vomiting.  [name] buPROPion (WELLBUTRIN XL) 150 MG 24 hr tablet Take 1 tablet by mouth every morning.  . clobetasol (OLUX) 0.05 % topical foam Apply topically 2 (two) times daily. (Patient not taking: Reported on 03/03/2014)  . dextroamphetamine (DEXEDRINE SPANSULE) 15 MG 24 hr capsule Take 15 mg by mouth daily.   . Diclofenac Sodium 3 % GEL Diclofenac,baclofen, cyclobenzaprine, lidocaine gel to affected area twice daily (Patient not taking: Reported on 03/03/2014)  . hydrocortisone (CORTEF) 5 MG tablet Take by mouth 10 mg in am and 5 mg in pm  . lubiprostone (AMITIZA) 8 MCG capsule Take 18 mcg by mouth daily.  . [DISCONTINUED] hydrocortisone (CORTEF) 5 MG tablet Take by mouth 10 mg in am and 5 mg in pm (Patient not taking: Reported on 03/03/2014)  . [DISCONTINUED] Lactulose 20 GM/30ML SOLN Take 30 mLs (20 g total) by mouth every 6 (  six) hours as needed. For relief of constipation (Patient not taking: Reported on 03/03/2014)  . [DISCONTINUED] pantoprazole (PROTONIX) 40 MG tablet Take 1 tablet (40 mg total) by mouth daily. (Patient not taking: Reported on 03/03/2014)     No orders of the defined types were placed in this encounter.    Return in about 3 months (around 06/01/2014).

## 2014-03-04 ENCOUNTER — Encounter: Payer: Self-pay | Admitting: Internal Medicine

## 2014-03-04 NOTE — Assessment & Plan Note (Signed)
She continues to take hydrocortisone  For AI

## 2014-03-04 NOTE — Assessment & Plan Note (Signed)
Have refilled her lexapro until she can see psychiatry

## 2014-03-05 ENCOUNTER — Ambulatory Visit (INDEPENDENT_AMBULATORY_CARE_PROVIDER_SITE_OTHER): Payer: 59 | Admitting: Psychology

## 2014-03-05 DIAGNOSIS — F332 Major depressive disorder, recurrent severe without psychotic features: Secondary | ICD-10-CM

## 2014-03-12 ENCOUNTER — Ambulatory Visit (INDEPENDENT_AMBULATORY_CARE_PROVIDER_SITE_OTHER): Payer: 59 | Admitting: Psychology

## 2014-03-12 DIAGNOSIS — F332 Major depressive disorder, recurrent severe without psychotic features: Secondary | ICD-10-CM

## 2014-03-14 ENCOUNTER — Ambulatory Visit (INDEPENDENT_AMBULATORY_CARE_PROVIDER_SITE_OTHER): Payer: 59 | Admitting: Psychology

## 2014-03-14 DIAGNOSIS — F314 Bipolar disorder, current episode depressed, severe, without psychotic features: Secondary | ICD-10-CM

## 2014-03-19 ENCOUNTER — Ambulatory Visit (INDEPENDENT_AMBULATORY_CARE_PROVIDER_SITE_OTHER): Payer: 59 | Admitting: Psychology

## 2014-03-19 DIAGNOSIS — F332 Major depressive disorder, recurrent severe without psychotic features: Secondary | ICD-10-CM | POA: Diagnosis not present

## 2014-03-25 ENCOUNTER — Ambulatory Visit: Payer: 59 | Admitting: Psychology

## 2014-04-09 ENCOUNTER — Ambulatory Visit (INDEPENDENT_AMBULATORY_CARE_PROVIDER_SITE_OTHER): Payer: 59 | Admitting: Psychology

## 2014-04-09 DIAGNOSIS — F332 Major depressive disorder, recurrent severe without psychotic features: Secondary | ICD-10-CM

## 2014-04-16 ENCOUNTER — Ambulatory Visit (INDEPENDENT_AMBULATORY_CARE_PROVIDER_SITE_OTHER): Payer: 59 | Admitting: Psychology

## 2014-04-16 DIAGNOSIS — F332 Major depressive disorder, recurrent severe without psychotic features: Secondary | ICD-10-CM

## 2014-04-23 ENCOUNTER — Ambulatory Visit (INDEPENDENT_AMBULATORY_CARE_PROVIDER_SITE_OTHER): Payer: 59 | Admitting: Psychology

## 2014-04-23 DIAGNOSIS — F3181 Bipolar II disorder: Secondary | ICD-10-CM | POA: Diagnosis not present

## 2014-04-23 DIAGNOSIS — F603 Borderline personality disorder: Secondary | ICD-10-CM | POA: Diagnosis not present

## 2014-04-25 NOTE — Consult Note (Signed)
Chief Complaint:  Subjective/Chief Complaint seen for pancreatitis.  Main c/o is headache.  No vomiting, less nausea, abd pain much improved form admission.   VITAL SIGNS/ANCILLARY NOTES: **Vital Signs.:   23-Jun-14 14:02  Temperature Temperature (F) 97.9  Celsius 36.6  Temperature Source oral  Pulse Pulse 71  Respirations Respirations 18  Systolic BP Systolic BP 387  Diastolic BP (mmHg) Diastolic BP (mmHg) 70  Mean BP 82  Pulse Ox % Pulse Ox % 93  Pulse Ox Activity Level  At rest  Oxygen Delivery 2L   Brief Assessment:  Cardiac Regular   Respiratory clear BS   Gastrointestinal details normal Soft  Nondistended  Bowel sounds normal  No rebound tenderness  mild discomfort in the epigastrum   Lab Results:  Hepatic:  23-Jun-14 04:22   Bilirubin, Total 0.4  Alkaline Phosphatase 74  SGPT (ALT)  140  SGOT (AST)  49  Total Protein, Serum  6.1  Albumin, Serum  2.9  General Ref:  21-Jun-14 07:46   ANA Comprehensive Panel ========== TEST NAME ==========  ========= RESULTS =========  = REFERENCE RANGE =  ANA COMPREHENSIVE PANEL  ANA Comprehensive Panel Anti-DNA (DS) Ab Qn             [   <1 IU/mL             ]               0-9               Negative      <5                                                   Equivocal  5 - 9                                                   Positive      >9 RNP Antibodies                  [   <0.2 AI              ]           0.0-0.9 Smith Antibodies                [   <0.2 AI              ]           0.0-0.9 Antiscleroderma-70 Antibodies   [   <0.2 AI              ]           0.0-0.9 Sjogren's Anti-SS-A             [   <0.2 AI              ]           0.0-0.9 Sjogren's Anti-SS-B          [   <0.2 AI              ]           0.0-0.9 Antichromatin Antibodies        [   <  0.2 AI              ]           0.0-0.9 Anti-Jo-1                       [   <0.2 AI              ]           0.0-0.9 Anti-Centromere B Antibodies    [   <0.2 AI              ]            0.0-0.9 See below:                      [   Final Report         ]                   Autoantibody                       Disease Association ------------------------------------------------------------         Condition                  Frequency ---------------------   ------------------------   --------- Antinuclear Antibody,    SLE, mixed connective Direct (ANA-D)           tissue diseases ---------------------   ------------------------   --------- dsDNA                    SLE                        40 - 60% ---------------------   ------------------------   --------- Chromatin                Drug induced SLE                90%                          SLE                        48 - 97% ---------------------   ------------------------   --------- SSA (Ro)                 SLE                        25 - 35%                          Sjogren's Syndrome         40 - 70%                          Neonatal Lupus                 100% ---------------------   ------------------------   --------- SSB (La)                 SLE                             10%  Sjogren's Syndrome              30% ---------------------   -----------------------    --------- Sm (anti-Smith)          SLE                        15 - 30% ---------------------   -----------------------    --------- RNP                      Mixed Connective Tissue                          Disease                         95% (U1 nRNP,          SLE                        30 - 50% anti-ribonucleoprotein)  Polymyositis and/or                          Dermatomyositis                 20% ---------------------   ------------------------   --------- Scl-70 (antiDNA          Scleroderma (diffuse)      20 - 35% topoisomerase)           Crest                           13% ---------------------   ------------------------   --------- Jo-1                     Polymyositis and/or                           Dermatomyositis            20 - 40% ---------------------   ------------------------   --------- Centromere B             Scleroderma - Crest                          variant                         80%               [facility]            No: [id number]  [address], [place], [place] [address]           Lindon Romp, MD         [phone]   Result(s) reported on 25 Jun 2012 at 01:51PM.  Routine Chem:  21-Jun-14 07:46   Lipase  > 10000 (Result(s) reported on 23 Jun 2012 at 09:14AM.)  22-Jun-14 06:01   Lipase  2168 (Result(s) reported on 24 Jun 2012 at 09:17AM.)  23-Jun-14 04:22   Glucose, Serum  115  BUN 16  Creatinine (comp) 0.68  Sodium, Serum 141  Potassium, Serum 4.5  Chloride, Serum  109  CO2, Serum 28  Calcium (Total), Serum  8.3  Osmolality (calc) 283  eGFR (African American) >60  eGFR (Non-African  American) >60 (eGFR values <31m/min/1.73 m2 may be an indication of chronic kidney disease (CKD). Calculated eGFR is useful in patients with stable renal function. The eGFR calculation will not be reliable in acutely ill patients when serum creatinine is changing rapidly. It is not useful in  patients on dialysis. The eGFR calculation may not be applicable to patients at the low and high extremes of body sizes, pregnant women, and vegetarians.)  Anion Gap  4  Lipase 179 (Result(s) reported on 25 Jun 2012 at 05:05AM.)   Radiology Results: MRI:    23-Jun-14 09:52, MRCP MR Cholangiogram  MRCP MR Cholangiogram   REASON FOR EXAM:    Abnormal LFT's. Suspected Gallstone pancreatitis.  COMMENTS:       PROCEDURE: MR  - MR CHOLANGIOGRAM  - Jun 25 2012  9:52AM     RESULT: History: Gallstone pancreatitis    Comparison: None    Technique: Multiplanar and multisequence imaging of the abdomen is   performed for M.R.C.P. without intravenous contrast.    Findings:    The liver is relatively increased in signal compared with the spleen as     can be seen  with hepatic steatosis. There is a no cholelithiasis.There is   no intrahepatic or extrahepatic biliary ductal dilatation. There is no   choledocholithiasis. There is pericholecystic fluid present.  There is   minimal fat stranding in the peripancreatic fat which may reflect changes   secondary to pancreatitis.  Evaluation of the pancreas is extremely   limited secondary to lack of intravenous contrast. There is no focal   fluid collection. The kidneys, adrenal glands and spleen are   unremarkable. There is no significant amount of abdominal free fluid.     The osseous structures are unremarkable. There is lower lumbar spine   posterior fusion.    IMPRESSION:     1. No intrahepatic or extrahepatic biliary ductal dilatation. No     cholelithiasis or choledocholithiasis. There is pericholecystic fluid   likely secondary to pancreatitis.    2. There is minimal fat stranding in the peripancreatic fat which may   reflect changes secondary to pancreatitis.  Evaluation of the pancreas is   extremely limited secondary to lack of intravenous contrast. There is no   focal fluid collection.        Verified By: HJennette Banker M.D., MD   Assessment/Plan:  Assessment/Plan:  Assessment 1) pancreatitis of uncertain etiology.  likely biliary in nature.  patietn had a high fat meal and ETOH the night before her pancreatitis began.   Plan 1) continue current.  advance to full liquids tomorrow as clinically feasible.  recommend dietary consult for low fat diet, although her cholesterol is low. Also recommended, as a precaution, decrease/ stop etoh.   Electronic Signatures: SLoistine Simas(MD)  (Signed 23-Jun-14 19:29)  Authored: Chief Complaint, VITAL SIGNS/ANCILLARY NOTES, Brief Assessment, Lab Results, Radiology Results, Assessment/Plan   Last Updated: 23-Jun-14 19:29 by SLoistine Simas(MD)

## 2014-04-25 NOTE — H&P (Signed)
PATIENT NAME:  [name], [name] MR#:  [id number] DATE OF BIRTH:  [birth year]  DATE OF ADMISSION:  06/23/2012  PRIMARY CARE PHYSICIAN: Duncan Dull, MD  CHIEF COMPLAINT: Abdominal pain, nausea and vomiting.   HISTORY OF PRESENT ILLNESS: This is a 59 year old female who presents to the hospital with a 2-day history of nausea, vomiting and abdominal pain. She described the abdominal pain as sharp in nature, located in the epigastric area, radiating to her back, associated with multiple episodes of nausea and vomiting over the past 2 days. The patient says that she is not able to keep much food down in the past 2 days. She presents to the ER as her symptoms are not improving and was noted to have a significantly elevated lipase of greater than 10,000 and also noted to have abnormal LFTs. Her CT scan of the abdomen and pelvis is suggestive of acute pancreatitis, although it shows no evidence of any acute biliary pathology. Hospitalist services are consulted for further treatment and evaluation. The patient denies any chest pain, any shortness of breath, any fevers, chills, cough or any other associated symptoms presently.   REVIEW OF SYSTEMS:  CONSTITUTIONAL: No documented fever. No weight gain, no weight loss. Positive generalized weakness.  EYES: No blurry or double vision.  ENT: No tinnitus. No postnasal drip. No redness of the oropharynx.  RESPIRATORY: No cough, no wheeze. No hemoptysis, no dyspnea.  CARDIOVASCULAR: No chest pain, no orthopnea, no palpitations, no syncope.  GASTROINTESTINAL: Positive nausea. Positive vomiting. No diarrhea. Positive epigastric abdominal pain. No melena or hematochezia.  GENITOURINARY: No dysuria or hematuria.  ENDOCRINE: No polyuria or nocturia. No heat or intolerance.  HEMATOLOGIC: No anemia, no bruising, no bleeding.  INTEGUMENTARY: No rashes. No lesions.  MUSCULOSKELETAL: No arthritis. No swelling. No gout.  NEUROLOGIC: No numbness or tingling. No ataxia. No  seizure-type activity.  PSYCHIATRIC: Positive anxiety and depression. No insomnia. No ADD.   PAST MEDICAL HISTORY: Consistent with hypertension, hypothyroidism, anxiety/depression.   ALLERGIES: PENICILLIN, ANCEF, BOTH OF WHICH CAUSE ANAPHYLAXIS.   SOCIAL HISTORY: Does smoke about 1/2-pack per day, has been smoking for the past 10 to 15 years. Has occasional alcohol use. No illicit drug abuse. Lives at home with her husband.   FAMILY HISTORY: Mother is alive, has osteoarthritis. Father died from complications of congestive heart failure.   CURRENT MEDICATIONS: As follows: 1. Bystolic 2.5 mg daily. 2. Synthroid 88 mcg daily. 3. Lexapro 20 mg 1.5 tabs daily. 4. Trifluoperazine 1 mg tab 1 tab t.i.d.  5. Lasix 20 mg daily as needed. 6. Klonopin 1 mg t.i.d.  7. Baclofen 10 mg at bedtime as needed.   PHYSICAL EXAMINATION: Presently, is as follows: VITAL SIGNS: Noted to be: Temperature: The patient is afebrile. Pulse in the low 100s, respirations 18, blood pressure is 140/70, sats are 99% on room air.  GENERAL: She is a pleasant-appearing female in no apparent distress.  HEAD, EYES, EARS, NOSE AND THROAT: The patient is atraumatic, normocephalic. Extraocular muscles are intact. Pupils are equal and reactive to light. Sclerae anicteric. No conjunctival injection. No pharyngeal erythema.  NECK: Supple. There is no jugular venous distention. No bruits, no lymphadenopathy, no thyromegaly.  HEART: Regular rate and rhythm, tachycardic. No murmurs, no rubs, no clicks.  LUNGS: Clear to auscultation bilaterally. No rales, no rhonchi, no wheezes.  ABDOMEN: Soft, flat. Tender in the epigastrium and right upper quadrant area. No rebound, no rigidity. Hypoactive bowel sounds. No hepatosplenomegaly appreciated.  EXTREMITIES: No evidence of any cyanosis, clubbing or  peripheral edema. Has +2 pedal and radial pulses bilaterally.  NEUROLOGICAL: The patient is alert, awake and oriented x3 with no focal motor or  sensory deficits appreciated bilaterally.  SKIN: Moist and warm, with no rash appreciated.  LYMPHATIC: There is no cervical or axillary lymphadenopathy.   LABORATORY DATA: Showed a serum glucose of 128, BUN 15, creatinine 0.8, sodium 139, potassium 3.6, chloride 104, bicarbonate 27. The patient's lipase is greater than 10,000. Albumin is 3.7, AST 400, ALT 345, alkaline phosphatase of 106. Troponin less than 0.02. White cell count 9.3, hemoglobin 14.5, hematocrit 42.3, platelet count 268.   The patient did have a CT scan of the abdomen and pelvis done with contrast showing CT findings consistent with pancreatitis. No evidence of pseudocyst or suggestion of necrosis. Hepatic steatosis.   The patient also had abdominal ultrasound done, limited, showing hepatic steatosis without evidence of any acute findings of acute cholecystitis.   ASSESSMENT AND PLAN: This is a 59 year old female with a history of hypertension, anxiety/depression, history of hypothyroidism, who presents to the hospital with abdominal pain, nausea, vomiting and noted to have acute pancreatitis.   1. Acute pancreatitis. The exact etiology of her pancreatitis is unclear. The patient has no history of alcohol abuse. Her LFTs are abnormal, but her ultrasound and CAT scan do not show any evidence of any gallstone or biliary pancreatitis. I will go ahead and check a lipid profile to rule out hypertriglyceridemia. Also, get an ANA profile. Continue supportive care with IV fluids, pain control and antiemetics. I will get a GI consult and discuss the case with Dr. Marva PandaSkulskie, who will see the patient.  2. Abnormal liver function tests. The exact etiology of this is unclear. Ultrasound and CT scan do not show any evidence of acute biliary pathology except some hepatic steatosis. I will check a hepatitis profile, follow her LFTs. Will get a GI consult. Case discussed with Dr. Marva PandaSkulskie.  3. Hypertension. Continue Bystolic. 4. Hypothyroidism.  Continue Synthroid. 5. Anxiety/depression. Continue with her Klonopin and Lexapro.   CODE STATUS: The patient is a full code.   TIME SPENT ON ADMISSION: 50 minutes.   ____________________________ Rolly PancakeVivek J. Cherlynn KaiserSainani, MD vjs:OSi D: 06/23/2012 11:38:33 ET T: 06/23/2012 12:06:18 ET JOB#: [id number]  cc: Rolly PancakeVivek J. Cherlynn KaiserSainani, MD, <Dictator> Houston SirenVIVEK J Berdella Bacot MD ELECTRONICALLY SIGNED 07/06/2012 15:05

## 2014-04-25 NOTE — Consult Note (Signed)
PATIENT NAME:  [name], POINTER MR#:  [id number] DATE OF BIRTH:  1955-02-16  DATE OF CONSULTATION:  06/23/2012  CONSULTING PHYSICIAN:  Lollie Sails, MD  DATE OF CONSULTATION: Dr. Abel Presto   REASON FOR CONSULTATION: Pancreatitis.   HISTORY OF PRESENT ILLNESS:  Miss [name] is a 59 year old Caucasian female who came to the Emergency Room this morning with abdominal pain, nausea and vomiting. She stated that yesterday she had some nausea and vomiting over the course of the day. She had awakened very early yesterday with some abdominal pain as well. However, this seemed to decrease over the course of the day. The abdominal pain radiated through to the back. She came to the Emergency Room because of the worsening today, and was admitted with pancreatitis. She states she has never had a previous episode of pancreatitis. In discussion with her, I did ask whether she did drink any alcohol or had any problems with her gallbladder or problems with hypertriglyceridemia. She states she does drink about 3 beers a week. She denies any history of peptic ulcer disease. She states that her cholesterol has been okay, just recently checked by her primary physician, Dr. Derrel Nip. She does, however, have a family history of gallbladder problems. Currently she is feeling a bit better. She is quite sleepy due to pain medications. GI Family History is pertinent for mother with history of cholecystectomy, daughter with peptic ulcer disease. There is no family history of colon or rectal cancer or liver disease.   PAST MEDICAL HISTORY:  History of spinal surgery x 2 for spinal stenosis, with support struts placed at L3-L4. She had a lipoma removed from the abdominal wall. She does see a cardiologist, Dr. Nehemiah Massed, for a "valve leak". However, in discussion with her, there are no recommendations for using antibiotics for dental work, etc. She does have a history of hypothyroidism, anxiety, depression and hypertension.    ALLERGIES:  CEPHALOSPORINS PENICILLIN, LATEX and TAPE.   CURRENT MEDICATIONS OUTPATIENT INCLUDE:  Baclofen 10 mg once a day, Bystolic  5 mg 0.5 tablets once a day at bedtime, Klonopin 1 mg t.i.d., Lasix 20 mg once a day, Levothroid 88 mcg daily, Lexapro 20 mg once a day, and trifluoperazine 1 mg 3 times a day.   PHYSICAL EXAMINATION: VITAL SIGNS:  Temperature is afebrile, blood pressure 114/79, pulse 64, respirations 17.  GENERAL: She is a 59 year old Caucasian female as noted above, sleepy, mildly uncomfortable.  HEENT:  Normocephalic, atraumatic. Eyes are anicteric. Nose:  Septum midline. No lesions.  Oropharynx:  No lesions.  NECK:  Supple. No JVD. No lymphadenopathy. No thyromegaly.  HEART:  Regular rate and rhythm.  LUNGS:  Clear.  ABDOMEN:  Soft. She is tender to palpation in the lower epigastric region as well as somewhat in the left lower quadrant. Bowel sounds are positive, normoactive. There is no apparent organomegaly or masses felt. There is no rebound.  ANORECTAL:  Exam deferred.  EXTREMITIES: No clubbing, cyanosis, or edema.  NEUROLOGICAL: Cranial nerves II through XII grossly intact. Muscle strength bilaterally equal and symmetric, 5/5. DTRs bilaterally equal and symmetric.   LABORATORIES INCLUDE THE FOLLOWING:  On admission to the hospital, she had a glucose of 128, BUN 15, creatinine 0.85, sodium 139, potassium 3.6, chloride 104, bicarb 24, calcium 10.4. Her lipase was greater than 10,000. Hepatic profile showing a total protein of 7.1, albumin 3.7, total bilirubin 1.1, direct bilirubin 0.5, alk phos 106, AST 400, ALT 345. These labs were done at 0746 this morning. Hemogram was  normal, with a white count of 9.3, H and H of 14.5/42.3, platelet count of 268. Urinalysis showed 3+ leukocyte esterase, 128 white blood cells per high power field, nitrite being negative. She had an abdominal ultrasound showing hepatic steatosis without evidence to support cholelithiasis. The gallbladder  wall thickness was within normal limits at 2 mm, and the common bile duct was 6.9 mm. She had an abdominal CT scan consistent with pancreatitis, with some loss of normal border of the head and uncinate process, trace amount of peripancreatic free fluid, no evidence of necrosis or pancreatic mass. There is hepatic steatosis.   ASSESSMENT: Acute pancreatitis. Possibly multifactorial. The patient does have a family history of biliary problems in primary relative. The patient herself has not had any problems with her gallbladder. She does drink alcohol, although expected to be less than threshold to give problems of pancreatitis. Currently, ANA and lipid panel are pending.   RECOMMENDATIONS:  Continue treatment as you are with bowel rest, ice chips if desired, pain control and IV fluids. If laboratory evaluation in regards to lipids and ANA are negative, would consider MRCP prior to discharge from the hospital to assure that there is no debris in the bile ducts that could have sparked a biliary pancreatitis.   Will follow with you. Thank you for this consult.    ____________________________ Lollie Sails, MD mus:mr D: 06/23/2012 18:43:28 ET T: 06/23/2012 20:46:45 ET JOB#: [id number]  cc: Lollie Sails, MD, <Dictator> Deborra Medina, MD  Lollie Sails MD ELECTRONICALLY SIGNED 07/30/2012 7:11

## 2014-04-25 NOTE — Consult Note (Signed)
Chief Complaint:  Subjective/Chief Complaint patient seen for pancreatitis .   feeling some better with 4/10 abdominal pain, but continues with nausea.  no emesis. passing some flatus.   VITAL SIGNS/ANCILLARY NOTES: **Vital Signs.:   22-Jun-14 13:48  Vital Signs Type Routine  Temperature Temperature (F) 99.9  Celsius 37.7  Temperature Source oral  Pulse Pulse 97  Respirations Respirations 18  Systolic BP Systolic BP 503  Diastolic BP (mmHg) Diastolic BP (mmHg) 76  Mean BP 88  Pulse Ox % Pulse Ox % 92  Pulse Ox Activity Level  At rest  Oxygen Delivery Room Air/ 21 %   Brief Assessment:  Cardiac Regular   Respiratory clear BS   Gastrointestinal details normal Soft  mild epigastric tenderness, bs positive.   Lab Results: Hepatic:  22-Jun-14 06:01   Bilirubin, Total 0.4  Alkaline Phosphatase 80  SGPT (ALT)  225  SGOT (AST)  104  Total Protein, Serum  6.3  Albumin, Serum  3.3  Routine Chem:  21-Jun-14 07:46   Lipase  > 10000 (Result(s) reported on 23 Jun 2012 at 09:14AM.)  22-Jun-14 06:01   Lipase  2168 (Result(s) reported on 24 Jun 2012 at 09:17AM.)  Glucose, Serum  116  BUN 16  Creatinine (comp) 0.87  Sodium, Serum 143  Potassium, Serum 4.2  Chloride, Serum  109  CO2, Serum 26  Calcium (Total), Serum  8.4  Osmolality (calc) 287  eGFR (African American) >60  eGFR (Non-African American) >60 (eGFR values <35m/min/1.73 m2 may be an indication of chronic kidney disease (CKD). Calculated eGFR is useful in patients with stable renal function. The eGFR calculation will not be reliable in acutely ill patients when serum creatinine is changing rapidly. It is not useful in  patients on dialysis. The eGFR calculation may not be applicable to patients at the low and high extremes of body sizes, pregnant women, and vegetarians.)  Anion Gap 8  Cholesterol, Serum 167  Triglycerides, Serum 89  HDL (INHOUSE)  62  VLDL Cholesterol Calculated 18  LDL Cholesterol Calculated  87 (Result(s) reported on 24 Jun 2012 at 09:06AM.)  Routine Hem:  22-Jun-14 06:01   WBC (CBC) 10.8  RBC (CBC) 4.03  Hemoglobin (CBC) 12.3  Hematocrit (CBC) 36.6  Platelet Count (CBC) 251  MCV 91  MCH 30.4  MCHC 33.5  RDW 14.5  Neutrophil % 80.4  Lymphocyte % 15.0  Monocyte % 4.3  Eosinophil % 0.0  Basophil % 0.3  Neutrophil #  8.6  Lymphocyte # 1.6  Monocyte # 0.5  Eosinophil # 0.0  Basophil # 0.0 (Result(s) reported on 24 Jun 2012 at 07:00AM.)   Radiology Results: [place]    21-Jun-14 08:57, [place]  [place]   REASON FOR EXAM:    epigasti and ruq pain  COMMENTS:   Body Site: GB and Fossa, CBD, Head of Pancreas; Right Upper   Quad    PROCEDURE: [place] - [place] SURVE[place]  - Jun 23 2012  8:57AM     RESULT: Right or quadrant abdominal ultrasound dated 06/23/2012.    Findings: The liver demonstrates a diffuse echogenic architecture.   Hepato- pedal flow is appreciated within the portal vein. Liver measures   15.77 cm along the midclavicular line. Limited evaluation of the pancreas   unremarkable. The gallbladder fossa demonstrates no evidence of   pericholecystic fluid, gallstones, sludging common nor a sonographic   Murphy's sign. Gallbladder wall thickness is within normal limits at 2 mm  and the common bile duct at 6.9 mm.  IMPRESSION:  Hepaticsteatosis without evidence of findings reflecting   cholecystitis.        Verified By: Mikki Santee, M.D., MD  CT:    21-Jun-14 10:01, CT Abdomen With Contrast  CT Abdomen With Contrast   REASON FOR EXAM:    pancreatitits;    NOTE: Nursing to Give Oral CT   Contrast  COMMENTS:       PROCEDURE: CT  - CT ABDOMEN STANDARD W  - Jun 23 2012 10:01AM     RESULT: CT abdomen pelvis dated 06/23/2012.    Technique: Helical 3 mm sections were obtained the lung bases through the   pubic symphysis status post intravenous ministration of 100 mm of   Isovue-370.    Findings: Mild  hypoventilation is appreciated within the lung bases.    The liver injected mild diffuse low attenuating architecture. The spleen,     adrenals, kidneys are unremarkable. Evaluation of the pancreas   demonstrates stranding within the surrounding peripancreatic fat.     There is loss of the normal undulating border of the head and uncinate   process region the. No drainable loculated fluid collection identified. A   trace amount of peripancreatic free fluid is appreciated. There is   enhancement of the pancreas without signs reflecting necrosis. No   pancreatic masses are clearly identified.    There is no evidenceof bowel obstruction, enteritis, colitis nor   diverticulitis. There is no evidence of abdominal aortic aneurysm. The   celiac, SMA, IMA, portal vein are unremarkable.    IMPRESSION:  CT findings consistent with pancreatitis. There is no   evidence of pseudocyst formation or evidence to suggest necrosis.  2. Hepatic steatosis  3. Correlation with pancreatic function tests recommended.        Verified By: Mikki Santee, M.D., MD   Assessment/Plan:  Assessment/Plan:  Assessment 1) pancreatitis, etiology uncertain, though likely biliary. improving sx.   Plan 1) continue current.  will follow.  Recommend mrcp when clinically feasible.   Electronic Signatures: Loistine Simas (MD)  (Signed 22-Jun-14 20:09)  Authored: Chief Complaint, VITAL SIGNS/ANCILLARY NOTES, Brief Assessment, Lab Results, Radiology Results, Assessment/Plan   Last Updated: 22-Jun-14 20:09 by Loistine Simas (MD)

## 2014-04-25 NOTE — Discharge Summary (Signed)
PATIENT NAME:  [name], Callee MR#:  [id number] DATE OF BIRTH:  07-27-1955  DATE OF ADMISSION:  06/23/2012 DATE OF DISCHARGE:  06/26/2012  For a detailed note, please take a look at the history and physical done on admission.   DIAGNOSES AT DISCHARGE:   1.  Acute pancreatitis.  2.  Hypothyroidism.  3.  Depression/anxiety.  4.  Urinary tract infection.   DIET:  The patient is being discharged on low sodium, low fat diet.   ACTIVITY:  As tolerated.   FOLLOWUP:  With Dr. Darrick Huntsmanullo in the next 1 to 2 weeks.   DISCHARGE MEDICATIONS:  Synthroid 88 mcg daily, Klonopin 1 mg t.i.d., Lexapro 20 mg 1-1/2 tabs daily, baclofen 10 mg at bedtime as needed, Lasix 20 mg daily as needed, Bystolic 2.5 mg at bedtime, trifluoperazine 1 mg t.i.d., ciprofloxacin 250 mg b.i.d. x 3 days.   CONSULTANTS DURING THE HOSPITAL COURSE:  Dr. Barnetta ChapelMartin Skulskie from gastroenterology.   PERTINENT STUDIES DONE DURING THE HOSPITAL COURSE:  A CT scan of the abdomen and pelvis done with contrast on June 21st showing CT findings consistent with pancreatitis. No evidence of pseudocyst formation or evidence to suggest necrosis. Hepatic steatosis. An MRCP done on June 23rd showing no intrahepatic or extrahepatic biliary ductal dilatation. No cholelithiasis or choledocholithiasis. An abdominal ultrasound limited done on June 21st showing hepatic steatosis without evidence of findings reflecting acute cholecystitis.   HOSPITAL COURSE:  This is a 59 year old female with medical problems as mentioned above presented to the hospital with epigastric and right upper quadrant abdominal pain with nausea, vomiting and a significantly elevated lipase consistent with acute pancreatitis.  1.  Acute pancreatitis. This was likely the cause of the patient's epigastric and right upper quadrant abdominal pain with nausea and vomiting. The patient was managed aggressively with IV fluids, antiemetics and pain control. The exact cause of her pancreatitis is still  unclear. She does not have any history of alcohol abuse. She had an abdominal ultrasound, a CT scan and an MRCP all of which were essentially normal not showing any evidence of biliary pancreatitis. Her lipase has significantly improved since admission down from greater than 10,000 to normalize. Her abdominal pain has resolved. She has had no further nausea and vomiting. She has been able to tolerate a clear liquid diet advanced to a low-fat diet. At this point, she is being discharged on a low-fat diet and told to abstain from alcohol completely. She will have close followup with her primary care physician as an outpatient. She also had her triglyceride levels checked which were essentially normal; therefore that is not a source of her pancreatitis either.  2.  Abnormal LFTs. The exact etiology of this is unclear, but suspected to be secondary to fatty liver versus passing of a common bile duct stone. As mentioned, an MRCP was normal. Her CT abdomen and pelvis was fairly normal other than hepatic steatosis. Her LFTs can be further followed by her primary care physician as an outpatient.  3.  Hypertension: The patient was maintained on her Bystolic. She will resume that.  4.  Hypothyroidism. The patient was maintained on her Synthroid. She will also resume that.  5.  Anxiety/depression. The patient was maintained on her Klonopin and Lexapro. She will also resume that.  6.  Urinary tract infection. This was incidentally noted on a urinalysis 2 days after admission. She is clinically asymptomatic. I am discharging her on p.o. Cipro as stated.   CODE STATUS:  The patient is  a full code.   TIME SPENT:  40 minutes.   ____________________________ [name]. Cherlynn Kaiser, MD vjs:si D: 06/26/2012 15:38:26 ET T: 06/26/2012 16:25:14 ET JOB#: [id number]  cc: [name]. Cherlynn Kaiser, MD, <Dictator> Renee Dull, MD Renee Siren MD ELECTRONICALLY SIGNED 07/06/2012 15:05

## 2014-04-25 NOTE — Consult Note (Signed)
Brief Consult Note: Diagnosis: acute pancreatitis.   Patient was seen by consultant.   Consult note dictated.   Recommend further assessment or treatment.   Discussed with Attending MD.   Comments: Please see full GI consult [phone].  Patient admitted with acute pancreatitis.  Positive h/o etoh, last etoh 3 days ago and likely below threshold for etiology, suspect biliary source.  Awaiting lipid panel and ANA.  If uninformative, would do MRCP before d/c to rule out debris or stone with borderline cbd.  Electronic Signatures: Barnetta ChapelSkulskie, Kylor Valverde (MD)  (Signed 21-Jun-14 18:47)  Authored: Brief Consult Note   Last Updated: 21-Jun-14 18:47 by Barnetta ChapelSkulskie, Dannilynn Gallina (MD)

## 2014-04-25 NOTE — Discharge Summary (Signed)
PATIENT NAME:  [name], [name] MR#:  [id number] DATE OF BIRTH:  1955-11-06  DATE OF ADMISSION:  06/23/2012 DATE OF DISCHARGE:  06/27/2012  Addendum  For a detailed note, please take a look at the history and physical done on admission. Please also look at the discharge summary done by me on June 26, 2012 and this is just the addendum to the discharge summary done yesterday.   The patient was going to be discharged yesterday after treatment for acute pancreatitis, although her discharge was held as she became a bit nauseous after eating and also had difficulty ambulating and was wobbly. The patient was kept overnight and had a physical therapy consult. Physical therapy recommended that she be arranged for home health physical therapy which is being arranged for her. She still had some mild epigastric pain and some nausea; therefore, I repeated her lipase level which was within normal limits. This morning, she is a bit more awake. She has tolerated p.o. well. She is therefore being discharged home on a low-fat diet and followup with Dr. Darrick Huntsmanullo as an outpatient. She is being arranged for home health physical therapy services as mentioned.   TIME SPENT:  30 minutes.   ____________________________ Rolly PancakeVivek J. Cherlynn KaiserSainani, MD vjs:si D: 06/27/2012 14:47:45 ET T: 06/27/2012 20:50:26 ET JOB#: [id number]  cc: Rolly PancakeVivek J. Cherlynn KaiserSainani, MD, <Dictator> [place] SirenVIVEK J Ariz Terrones MD ELECTRONICALLY SIGNED 07/06/2012 15:05

## 2014-04-26 NOTE — Discharge Summary (Signed)
Dates of Admission and Diagnosis:  Date of Admission 27-Mar-2013   Date of Discharge 28-Mar-2013   Admitting Diagnosis Abdominal Pain   Final Diagnosis Hypotension not symptomatic   Discharge Diagnosis 1 UTI   2 Abdominal pain from UTI    Chief Complaint/History of Present Illness CHIEF COMPLAINT: Abdominal pain.   HISTORY OF PRESENT ILLNESS: This is a very nice 59 year old female with history of psychiatric issues, hypothyroidism, low blood pressure. 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 It has been said that the patient has hypertension although she states that she always has low blood pressure, although she continues to take blood pressure medications.   The patient comes today with a history of abdominal pain for about a week. The patient states that she had significant right side flank and lower abdominal pain which is intermittent, goes off and on, can be resolved by itself, without any alleviating factors or come up by itself without any worsening factors or triggers.   The patient states that pain could be sharp and sometimes crampy with radiation from the upper  right flank down to the lower right side of her abdomen and occasionally to the back.   The patient states that she has had 1 episode of looser stools last Friday, and since then she has not had any bowel movement and she seems to be constipated for several days prior. She has been taking more hydrocodone due to her abdominal pain and other issues, for which she is getting constipated.   The patient was evaluated in the Emergency Department. She was going to be discharged but her blood pressure has been low. She received 1 liter of fluids and her blood pressure remains in the 90s over 50s. She was as low as 89/54. She is not tachycardic, not tachypneic. Overall hemodynamically stable.   Allergies:  Penicillin: Anaphylaxis  Other -Explain  in Comment: Unknown  Tape: Unknown  Cephalosporins: Unknown  Latex: Unknown  Hepatic:  26-Mar-15 05:10   Bilirubin, Total 0.2  Alkaline Phosphatase  37 (45-117 NOTE: New Reference Range 11/23/12)  SGPT (ALT) 20  SGOT (AST)  10  Total Protein, Serum  5.5  Albumin, Serum  2.7  Routine Chem:  26-Mar-15 05:10   Glucose, Serum 79  BUN 14  Creatinine (comp) 0.98  Sodium, Serum 142  Potassium, Serum 4.2  Chloride, Serum  110  CO2, Serum 27  Calcium (Total), Serum  8.1  Osmolality (calc) 283  eGFR (African American) >60  eGFR (Non-African American) >60 (eGFR values <57m/min/1.73 m2 may be an indication of chronic kidney disease (CKD). Calculated eGFR is useful in patients with stable renal function. The eGFR calculation will not be reliable in acutely ill patients when serum creatinine is changing rapidly. It is not useful in  patients on dialysis. The eGFR calculation may not be applicable to patients at the low and high extremes of body sizes, pregnant women, and vegetarians.)  Anion Gap  5  Magnesium, Serum 1.9 (1.8-2.4 THERAPEUTIC RANGE: 4-7 mg/dL TOXIC: > 10 mg/dL  -----------------------)  Routine Hem:  26-Mar-15 05:10   WBC (CBC) 6.5  RBC (CBC) 4.06  Hemoglobin (CBC) 12.5  Hematocrit (CBC) 37.7  Platelet Count (CBC) 225  MCV 93  MCH 30.9  MCHC 33.3  RDW 13.6  Neutrophil % 46.1  Lymphocyte % 42.3  Monocyte % 7.0  Eosinophil % 3.4  Basophil % 1.2  Neutrophil # 3.0  Lymphocyte # 2.8  Monocyte # 0.5  Eosinophil # 0.2  Basophil # 0.1 (Result(s) reported on 28 Mar 2013 at 05:47AM.)   PERTINENT RADIOLOGY STUDIES: [place]:    06-Mar-15 22:08, US Abdomen General Survey  US Abdomen General Survey   REASON FOR EXAM:    RUQ pain; mildly elevated lipase; UTI; eval R flank   also  COMMENTS:       PROCEDURE: [place]  - US ABDOMEN GENERAL SURVEY  - Mar 08 2013 10:08PM     CLINICAL DATA:  Right upper quadrant pain.  Mildly elevated lipase.    EXAM:  ULTRASOUND ABDOMEN  COMPLETE    COMPARISON:  None.    FINDINGS:  Gallbladder:  No gallstones or wall thickening visualized. No sonographic Murphy  sign noted.    Common bile duct:    Diameter: 4.8 mm    Liver:    No focal lesion identified.  Hepatic steatosis.    IVC:    No visualized abnormality.  Pancreas:    No focal pancreatic mass or pseudocyst. Dilated pancreatic duct up  to 3.5 mm consistent with chronic inflammation or previous insult.    Spleen:    Size and appearance within normal limits.    Right Kidney:    Length: 10.8 cm. Echogenicity within normal limits. No mass or  hydronephrosis visualized.    Left Kidney:  Length: 10.5 cm. Echogenicity within normal limits. No mass or  hydronephrosis visualized.    Abdominal aorta:    No aneurysm visualized.    Other findings:    Good general agreement with prior imaging.     IMPRESSION:  No acute intra-abdominal findings. Hepatic steatosis. Dilated  pancreatic duct up to 3.5 mm suggesting chronic pancreatitis. No  areas of acute pancreatic inflammation are seen. Gallbladder appears  unremarkable.      Electronically Signed    By: Rolla Flatten M.D.    On: 03/08/2013 22:22         Verified By: Staci Righter, M.D.,  [place]:  PACS Image     25-Mar-15 12:23, CT Abdomen and Pelvis With Contrast  PACS Image   CT:  CT Abdomen and Pelvis With Contrast   REASON FOR EXAM:    (1) r sided abd pain; (2) r sided abd pain  COMMENTS:       PROCEDURE: CT  - CT ABDOMEN / PELVIS  W  - Mar 27 2013 12:23PM     CLINICAL DATA:  Right-sided abdominal pain.    EXAM:  CT ABDOMEN AND PELVIS WITH CONTRAST    TECHNIQUE:  Multidetector CT imaging of the abdomen and pelvis was performed  using the standard protocol following bolus administration of  intravenous contrast.  CONTRAST:  100 cc Isovue 370 IV    COMPARISON:  US ABDOMEN COMPLETE dated 03/08/2013; CT ABDOMEN W/CM  dated 06/23/2012    FINDINGS:  Linear areas of  subsegmental atelectasis or scarring in the lung  bases. Heart is normal size. No effusions.    Suspect mild fatty infiltration of the liver. No focal abnormality.  Gallbladder, spleen, pancreas, adrenals and kidneys are normal.    Appendix is visualized and is normal. This is best seen on coronal  imaging. Uterus, adnexae and urinary bladder are unremarkable.  Scattered sigmoid diverticulosis. No active diverticulitis. Also,  there are a few scattered diverticula in the descending colon and  splenic flexure as well. Small bowel is decompressed. No free fluid,  free air or adenopathy. Aorta is normal  caliber.    Posterior spinal fusion in the lower lumbar spine. No acute bony  abnormality.     IMPRESSION:  Mild fatty infiltration of the liver.    Left colonic diverticulosis.  No active diverticulitis.    Normal appendix.    No acute findings in the abdomen or pelvis.  Electronically Signed    By: Rolm Baptise M.D.    On: 03/27/2013 12:44         Verified By: Raelyn Number, M.D.,   Pertinent Past History:  Pertinent Past History HTN Depression Anxiety   Hospital Course:  Hospital Course 1. Abdominal pain: She had a Ct of the abdomen in teh ER which did nt show an acute etiology. My suspicion is that it may be related toher UTI. She is not having abdominal pain currently adn denies any symptoms of an ulcer/melena.  2. UTI: She was on levaquin as an outpatient,but stil has a UTI. She is on Bactrim which we wil continue for 7 more days. I do not see any urine cultures.  3. Hypontesion: She is asymptomatic. SH ehas no clear etiology of her low blood pressure. We have stopped Bystolic. I ordered a cortisol level which her PCP can follow up on. She is not septic as she does not have a fever or elevated WBC. She will monior her blood pressure at home.  4. Depression: We have continued her outpatient medications. 5. Hypothryroid: She will continue Synthroid   Condition on  Discharge Stable   Code Status:  Code Status Full Code   DISCHARGE INSTRUCTIONS HOME MEDS:  Medication Reconciliation: Patient's Home Medications at Discharge:     Medication Instructions  levothroid 88 mcg (0.088 mg) oral tablet  1 tab(s) orally once a day    klonopin 1 mg oral tablet  1 tab(s) orally 3 times a day   baclofen 10 mg oral tablet  1 tab(s) orally once a day (at bedtime), As Needed   dexedrine 15 mg oral capsule, extended release  1 cap(s) orally once a day (in the morning)   ondansetron 4 mg oral tablet, disintegrating  1-2  orally every 8 hours for Nausea, Vomiting   lexapro 20 mg oral tablet  1 tab(s) orally once a day   wellbutrin xl 150 mg/24 hours oral tablet, extended release  1 tab(s) orally every 24 hours   ondansetron 4 mg oral tablet, disintegrating  1 tab(s) orally 3 times a day   acetaminophen-hydrocodone 325 mg-5 mg oral tablet  1 tab(s) orally 4 times a day   sulfamethoxazole-trimethoprim  1 tab(s) orally every 12 hours x 7 days    PRESCRIPTIONS: PRINTED AND PLACED ON CHART  STOP TAKING THE FOLLOWING MEDICATION(S):    bystolic 5 mg oral tablet: 0.5 tab(s) orally once a day (at bedtime)  Physician's Instructions:  Home Health? No   Treatments None   Home Oxygen? No   Diet Regular   Activity Limitations As tolerated   Referrals None   Return to Work Not Applicable   Time frame for Follow Up Appointment 1-2 days  dr Derrel Nip   Electronic Signatures: Bettey Costa (MD)  (Signed 26-Mar-15 13:20)  Authored: ADMISSION DATE AND DIAGNOSIS, CHIEF COMPLAINT/HPI, Allergies, PERTINENT LABS, PERTINENT RADIOLOGY STUDIES, PERTINENT PAST HISTORY, HOSPITAL COURSE, DISCHARGE INSTRUCTIONS HOME MEDS, PATIENT INSTRUCTIONS   Last Updated: 26-Mar-15 13:20 by Bettey Costa (MD)

## 2014-04-26 NOTE — H&P (Signed)
PATIENT NAME:  [name], [place] MR#:  [id number] DATE OF BIRTH:  November 21, 1955  DATE OF ADMISSION:  03/27/2013  PRIMARY CARE PHYSICIAN: Duncan Dulleresa Tullo.   CHIEF COMPLAINT: Abdominal pain.   HISTORY OF PRESENT ILLNESS: This is a very nice 59 year old female with history of psychiatric issues, hypothyroidism, low blood pressure. 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 It has been said that the patient has hypertension although she states that she always has low blood pressure, although she continues to take blood pressure medications.   The patient comes today with a history of abdominal pain for about a week. The patient states that she had significant right side flank and lower abdominal pain which is intermittent, goes off and on, can be resolved by itself, without any alleviating factors or come up by itself without any worsening factors or triggers.   The patient states that pain could be sharp and sometimes crampy with radiation from the upper  right flank down to the lower right side of her abdomen and occasionally to the back.   The patient states that she has had 1 episode of looser stools last Friday, and since then she has not had any bowel movement and she seems to be constipated for several days prior. She has been taking more hydrocodone due to her abdominal pain and other issues, for which she is getting constipated.   The patient was evaluated in the Emergency Department. She was going to be discharged but her blood pressure has been low. She received 1 liter of fluids and her blood pressure remains in the 90s over 50s. She was as low as 89/54. She is not tachycardic, not tachypneic. Overall hemodynamically stable.   REVIEW OF SYSTEMS:  CONSTITUTIONAL: No fever. No fatigue or weakness. No weight loss or weight gain.  EYES: No blurry vision, double vision.  ENT: No difficulty swallowing or tinnitus.   RESPIRATORY: No cough, wheezing, hemoptysis or dyspnea.  CARDIOVASCULAR: No chest pain, orthopnea, syncope or palpitations.  GASTROINTESTINAL: Positive nausea. Negative vomiting. No diarrhea. Only 1 loose bowel movement last Friday. Positive mild constipation. No melena or hematochezia. Positive abdominal pain as mentioned above.  GENITOURINARY: Mild dysuria that has improved from previous UTI but it still remains.  ENDOCRINE: No polyuria or polydipsia. HEMOLYMPHATIC: No anemia, easy bruising or bleeding.  SKIN: No rashes, petechiae, or new lesions.  MUSCULOSKELETAL: No significant neck pain. Positive occasional back pain.  NEUROLOGIC: No numbness, tingling, or seizures.  PSYCHIATRIC: Positive for anxiety, depression, well controlled.   PAST MEDICAL HISTORY:  1.  Apparently low blood pressure.  2.  Hypothyroidism.  3.  Apparently she has a depression  4.  Anxiety.  5.  Depression.  6.  Occasional tachycardia.  7.  Fatty liver. 8.  ADD.  ALLERGIES: PENICILLIN AND ANCEF CAUSE ANAPHYLAXIS.   PAST SURGICAL HISTORY:  1.  Nasal surgery.  2.  Tonsillectomy.  3.  Lower back surgery.   SOCIAL HISTORY: The patient smokes 1/2 pack a day. She has been smoking for over 15 years. She does not drink alcohol on a regular basis. The last time she says is about 6 weeks or more. No use of drugs. She lives at home with her husband. She is retired.   FAMILY HISTORY: Positive for CHF in her father who died from complications of it.   MEDICATIONS AT DISCHARGE: Wellbutrin 150 mg XL every 24 hours, Zofran 4 mg three  times a day as needed for nausea, Lexapro 20 mg once a day, levothyroxine -mcg once daily. Levaquin once a day. It is already finished. Klonopin 1 mg three times daily, Dexedrine 15 mg once a day, Bystolic 5 mg take half tablet once a day, baclofen 10 mg once a day, Norco 1 every four hours as needed for pain.   PHYSICAL EXAMINATION:  VITAL SIGNS: Blood pressure 93/58 or 89/54, 68 heart rate,  18 respiratory rate. Temperature 97.5, oxygen saturation 96% on room.  GENERAL: Alert, oriented x3, in no acute distress. No respiratory distress. Hemodynamically stable.  HEENT: Pupils are equal and reactive. Extraocular movements are intact. Mucosae are moist. Anicteric sclerae. Pink conjunctivae. No oral lesions. No oropharyngeal exudates.  NECK: Supple. No JVD. No thyromegaly. No adenopathy. No carotid bruits.  CARDIOVASCULAR: Regular rate and rhythm. No murmurs, rubs or gallops are appreciated. No displacement of PMI.  LUNGS: Clear without any wheezing or crepitus. No use of accessory muscles.  ABDOMEN: Soft, nontender, nondistended. No hepatosplenomegaly. No masses or lesions. The patient actually did not have any significant pain whenever I was palpating her stomach and talking to her. Whenever I pushed hard on her right flank, she had some increased discomfort.   GENITAL: Deferred.  EXTREMITIES: No edema, cyanosis or clubbing. Pulses +2. Capillary refill less than 3.  NEUROLOGIC: Cranial nerves II through XII intact. Strength is 5/5 in all 4 extremities. No focal findings. Mood is normal. The patient states she is depressed, but at this moment seems to be compensated. No agitation or suicidal thoughts.  LYMPHATIC: Negative for lymphadenopathy in neck or supraclavicular areas.  MUSCULOSKELETAL: No joint effusions or joint swelling.   DIAGNOSTIC RESULTS: CT of the abdomen: No major abnormalities. I can see significant amount of stool at the level of the right ascending colon, descending colon, and ampulla, for what we are going to start doing some catharsis.   LABORATORIES: Overall remarkable. Glucose of 99, creatinine 1. Electrolytes were within normal limits. LFTs were within normal limits. Troponin is negative. TSH 2.2. Lipase is 333. White count is 7.7, hematocrit 42, hemoglobin 14, platelet count 268.   Urinalysis: White blood cells in urine 131, positive leukocyte esterase and red blood  cells. Trace bacteria.   The patient is admitted for abdominal pain observation.  1.  Abdominal pain. At this moment, I think this is mostly related to constipation. The patient had significant amount of stool in the colon based on CT scan findings. We are going to start on magnesium citrate and MiraLAX..  2.  No other acute abnormalities. The patient had pancreatitis the last time that she was here but now is resolved. Her lipase is 333. The patient looks slightly dehydrated and thus the likelihood of her low blood pressure.  3.  Low blood pressure. At this moment, we are going to provide IV fluids 100 mL/hour. She already got 1 L. We are going to continue to monitor closely, and she is not tachycardic, not significantly in any signs of shock. Does not seem to be any signs of systemic inflammatory response syndrome.  4.  Depression and anxiety seems to be stable.  5.  Hypotension. The patient actually has a history of hypertension, takes Bystolic. We are going to hold the Bystolic today and consider stopping it as the patient has history of not having high blood pressures in general.  6.  Depression. Continue Lexapro.  7.  Nausea controlled with Zofran.  8.  Deep vein thrombosis prophylaxis with  heparin.  9.  Gastrointestinal prophylaxis with proton pump inhibitor.  10.  Other medical problems are stable.   TIME SPENT: About 45 minutes with this patient.   ____________________________ Felipa Furnace, MD rsg:np D: 03/27/2013 15:38:22 ET T: 03/27/2013 16:48:31 ET JOB#: [id number]  cc: Felipa Furnace, MD, <Dictator> Karston Hyland Juanda Chance MD ELECTRONICALLY SIGNED 03/31/2013 13:50

## 2014-04-28 ENCOUNTER — Other Ambulatory Visit: Payer: Self-pay | Admitting: Internal Medicine

## 2014-04-28 NOTE — Telephone Encounter (Signed)
Last OV and refill 1.29.16.  As per note #30 w/1 refill until seen by psychiatry.  Please advise refill

## 2014-04-29 NOTE — Telephone Encounter (Signed)
Refill one 30 days only.   

## 2014-04-30 ENCOUNTER — Ambulatory Visit (INDEPENDENT_AMBULATORY_CARE_PROVIDER_SITE_OTHER): Payer: 59 | Admitting: Psychology

## 2014-04-30 DIAGNOSIS — F3181 Bipolar II disorder: Secondary | ICD-10-CM

## 2014-04-30 DIAGNOSIS — F0630 Mood disorder due to known physiological condition, unspecified: Secondary | ICD-10-CM | POA: Diagnosis not present

## 2014-05-01 ENCOUNTER — Other Ambulatory Visit: Payer: Self-pay | Admitting: Internal Medicine

## 2014-05-04 ENCOUNTER — Other Ambulatory Visit: Payer: Self-pay | Admitting: Internal Medicine

## 2014-05-05 ENCOUNTER — Other Ambulatory Visit: Payer: Self-pay | Admitting: Internal Medicine

## 2014-05-07 ENCOUNTER — Ambulatory Visit (INDEPENDENT_AMBULATORY_CARE_PROVIDER_SITE_OTHER): Payer: 59 | Admitting: Psychology

## 2014-05-07 DIAGNOSIS — F603 Borderline personality disorder: Secondary | ICD-10-CM

## 2014-05-07 DIAGNOSIS — F3181 Bipolar II disorder: Secondary | ICD-10-CM

## 2014-05-14 ENCOUNTER — Ambulatory Visit: Payer: 59 | Admitting: Psychology

## 2014-05-21 ENCOUNTER — Ambulatory Visit (INDEPENDENT_AMBULATORY_CARE_PROVIDER_SITE_OTHER): Payer: 59 | Admitting: Psychology

## 2014-05-21 DIAGNOSIS — F603 Borderline personality disorder: Secondary | ICD-10-CM

## 2014-05-21 DIAGNOSIS — F3181 Bipolar II disorder: Secondary | ICD-10-CM

## 2014-05-28 ENCOUNTER — Ambulatory Visit (INDEPENDENT_AMBULATORY_CARE_PROVIDER_SITE_OTHER): Payer: 59 | Admitting: Psychology

## 2014-05-28 DIAGNOSIS — F603 Borderline personality disorder: Secondary | ICD-10-CM | POA: Diagnosis not present

## 2014-05-28 DIAGNOSIS — F3181 Bipolar II disorder: Secondary | ICD-10-CM | POA: Diagnosis not present

## 2014-05-30 ENCOUNTER — Other Ambulatory Visit: Payer: Self-pay | Admitting: Internal Medicine

## 2014-05-30 NOTE — Telephone Encounter (Signed)
Needs to get refills from psychiatrist, per Dr. Darrick Huntsmanullo 04/28/14 note

## 2014-06-03 ENCOUNTER — Ambulatory Visit (INDEPENDENT_AMBULATORY_CARE_PROVIDER_SITE_OTHER): Payer: 59 | Admitting: Internal Medicine

## 2014-06-03 ENCOUNTER — Encounter: Payer: Self-pay | Admitting: Internal Medicine

## 2014-06-03 VITALS — BP 98/64 | HR 83 | Temp 98.2°F | Resp 16

## 2014-06-03 DIAGNOSIS — F3130 Bipolar disorder, current episode depressed, mild or moderate severity, unspecified: Secondary | ICD-10-CM

## 2014-06-03 NOTE — Progress Notes (Signed)
Subjective:  Patient ID: [name], female    DOB: [birth year]  Age: 59 y.o. MRN: [id number]  CC: The encounter diagnosis was Bipolar I disorder, most recent episode depressed.  HPI [name] presents for follow up but is having a manic state which started last night with insomnia.  Can't shut her mind off at night,  keeps thinking of all the thing she needs to do  Tearful.  Thinks about suicide "but I would never do that."  Hearing her own voicoe in her head saying negative things,  Crying fitfully in the office.  "Im so tired of all this drama." Psychitrists is Dr Sharrie [phone].  Next appt not for 3 weeks.   Latuda dose was recently titrated up to 90 mg and  Klonopin was not changed, while her stelazine has been reduced .She has not been taking rhe recommended amount of clonazepam and stellazine.  Outpatient Prescriptions Prior to Visit  Medication Sig Dispense Refill  . albuterol (PROVENTIL HFA;VENTOLIN HFA) 108 (90 BASE) MCG/ACT inhaler Inhale 2 puffs into the lungs every 6 (six) hours as needed. 3.7 g 3  . clonazePAM (KLONOPIN) 1 MG tablet Take 0.5 mg by mouth 3 (three) times daily as needed.     [name] escitalopram (LEXAPRO) 20 MG tablet TAKE 1 TABLET BY MOUTH DAILY 30 tablet 0  . furosemide (LASIX) 20 MG tablet Take 1 tablet (20 mg total) by mouth 2 (two) times daily. (Patient taking differently: Take 20 mg by mouth 2 (two) times daily as needed. ) 30 tablet 6  . hydrocortisone (CORTEF) 5 MG tablet Take by mouth 10 mg in am and 5 mg in pm 100 tablet 11  . hydrocortisone (CORTEF) 5 MG tablet TAKE 2 TABLETS BY MOUTH IN THE MORNING AND TAKE 1 TABLET BY MOUTH IN THE EVENING 100 tablet 10  . levothyroxine (SYNTHROID, LEVOTHROID) 88 MCG tablet TAKE 1 TABLET BY MOUTH EVERY DAY 90 tablet 2  . ondansetron (ZOFRAN-ODT) 4 MG disintegrating tablet Take 1 tablet (4 mg total) by mouth every 8 (eight) hours as needed for nausea or vomiting. 20 tablet 3  . tizanidine (ZANAFLEX) 6 MG capsule  TAKE 1 CAPSULE (6 MG TOTAL) BY MOUTH 3 (THREE) TIMES DAILY AS NEEDED FOR MUSCLE SPASMS. 60 capsule 0  . benztropine (COGENTIN) 1 MG tablet Take 1 mg by mouth 2 (two) times daily.  0  . buPROPion (WELLBUTRIN XL) 150 MG 24 hr tablet Take 1 tablet by mouth every morning.    . clobetasol (OLUX) 0.05 % topical foam Apply topically 2 (two) times daily. (Patient not taking: Reported on 03/03/2014) 50 g 2  . dextroamphetamine (DEXEDRINE SPANSULE) 15 MG 24 hr capsule Take 15 mg by mouth daily.     . Diclofenac Sodium 3 % GEL Diclofenac,baclofen, cyclobenzaprine, lidocaine gel to affected area twice daily (Patient not taking: Reported on 03/03/2014) 100 g 1  . lubiprostone (AMITIZA) 8 MCG capsule Take 18 mcg by mouth daily.    . Vitamin D, Ergocalciferol, (DRISDOL) 50000 UNITS CAPS capsule TAKE 1 CAPSULE (50,000 UNITS TOTAL) BY MOUTH ONCE A WEEK. (Patient not taking: Reported on 06/03/2014) 4 capsule 2   No facility-administered medications prior to visit.    Review of Systems;  Patient denies headache, fevers, malaise, unintentional weight loss, skin rash, eye pain, sinus congestion and sinus pain, sore throat, dysphagia,  hemoptysis , cough, dyspnea, wheezing, chest pain, palpitations, orthopnea, edema, abdominal pain, nausea, melena, diarrhea, constipation, flank pain, dysuria, hematuria, urinary  Frequency,  nocturia, numbness, tingling, seizures,  Focal weakness, Loss of consciousness,  Tremor,    Objective:  BP 98/64 mmHg  Pulse 83  Temp(Src) 98.2 F (36.8 C) (Oral)  Resp 16  SpO2 98%  BP Readings from Last 3 Encounters:  06/03/14 98/64  03/03/14 128/86  12/18/13 134/76    Wt Readings from Last 3 Encounters:  03/03/14 240 lb 12 oz (109.203 kg)  12/18/13 236 lb (107.049 kg)  12/09/13 233 lb 12 oz (106.028 kg)    General appearance: alert, cooperative and appears stated age Ears: normal TM's and external ear canals both ears Throat: lips, mucosa, and tongue normal; teeth and gums  normal Lungs: clear to auscultation bilaterally Heart: regular rate and rhythm, S1, S2 normal, no murmur, click, rub or gallop Abdomen: soft, non-tender; bowel sounds normal; no masses,  no organomegaly Pulses: 2+ and symmetric Skin: Skin color, texture, turgor normal. No rashes or lesions Lymph nodes: Cervical, supraclavicular, and axillary nodes normal. Psych: affect sad/tearful/anxiousl, makes good eye contact. No fidgeting,  Smiles easily.  Denies suicidal thoughts   Lab Results  Component Value Date   HGBA1C 5.8 01/23/2012    Lab Results  Component Value Date   CREATININE 0.9 12/11/2013   CREATININE 0.98 07/03/2013   CREATININE 0.72 04/24/2013    Lab Results  Component Value Date   WBC 7.9 12/11/2013   HGB 13.1 12/11/2013   HCT 40.4 12/11/2013   PLT 296.0 12/11/2013   GLUCOSE 90 12/11/2013   CHOL 221* 12/11/2013   TRIG 105.0 12/11/2013   HDL 74.50 12/11/2013   LDLDIRECT 150.4 01/23/2012   LDLCALC 126* 12/11/2013   ALT 24 12/11/2013   AST 16 12/11/2013   NA 138 12/11/2013   K 4.8 12/11/2013   CL 104 12/11/2013   CREATININE 0.9 12/11/2013   BUN 20 12/11/2013   CO2 27 12/11/2013   TSH 1.53 12/11/2013   HGBA1C 5.8 01/23/2012    Mr Brain W Wo Contrast  08/29/2013   CLINICAL DATA:  59 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 Low cortisol and ACTH. Initial encounter.  BUN and creatinine [facility] at  [address]  Results:  BUN 20 mg/dL,  Creatinine 1.1 mg/dL.  EXAM: MRI HEAD WITHOUT AND WITH CONTRAST  TECHNIQUE: Multiplanar, multiecho pulse sequences of the brain and surrounding structures were obtained without and with intravenous contrast.  CONTRAST:  10mL MULTIHANCE GADOBENATE DIMEGLUMINE 529 MG/ML IV SOLN  COMPARISON:  [facility] Brain MRI 11/03/2009  FINDINGS: Cerebral volume is not significantly changed. Major intracranial vascular flow voids are stable, dominant distal left vertebral  artery. No restricted diffusion to suggest acute infarction. No midline shift, mass effect, evidence of mass lesion, ventriculomegaly, extra-axial collection or acute intracranial hemorrhage. Cervicomedullary junction within normal limits. Negative visualized cervical spine. Wallace CullensGray and white matter signal is within normal limits throughout the brain. Excluding the pituitary region, No abnormal enhancement identified.  Visible internal auditory structures appear normal. Visualized paranasal sinuses and mastoids are clear. Visualized orbit soft tissues are within normal limits. Visualized bone marrow signal is within normal limits. Visualized scalp soft tissues are within normal limits.  Dedicated pituitary imaging. Overall pituitary size and configuration are normal, and appear stable. Normal infundibulum. No suprasellar mass or mass effect. Normal hypothalamus. Normal cavernous sinus. Following contrast, it dynamic and delayed pituitary gland enhancement is within normal limits. No pituitary lesion or mass identified. No abnormal enhancement identified elsewhere.  IMPRESSION: 1. Normal MRI appearance of the  pituitary. 2. Stable and normal MRI appearance of the brain.   Electronically Signed   By: Augusto Gamble M.D.   On: 08/29/2013 08:08    Assessment & Plan:   Problem List Items Addressed This Visit    Bipolar I disorder, most recent episode depressed - Primary    Spent 40 minutes with patient today assessing suicidality, reviewing medications with psychiatrist's office and making corrections with patient .Patient is abel to contract for safety and will follow up with psychiatry.        A total of 40 minutes was spent with patient more than half of which was spent in counseling patient on the above mentioned issues  and coordination of care.  I am having Ms. Barrantes maintain her furosemide, Diclofenac Sodium, buPROPion, dextroamphetamine, lubiprostone, clobetasol, clonazePAM, albuterol, benztropine,  ondansetron, hydrocortisone, escitalopram, levothyroxine, tizanidine, hydrocortisone, Vitamin D (Ergocalciferol), trifluoperazine, and LATUDA.  Meds ordered this encounter  Medications  . trifluoperazine (STELAZINE) 2 MG tablet    Sig: Take 1 tablet by mouth 2 (two) times daily.  [name] LATUDA 60 MG TABS    Sig: Take 1 tablet by mouth daily.    There are no discontinued medications.  Follow-up: No Follow-up on file.   Sherlene Shams, MD

## 2014-06-03 NOTE — Patient Instructions (Signed)
Continue your Latuda 90 mg daily  Increase your Klonipin to a full tablet three times daily   Decrease your Stelazine to 1`/2 tablet at bedtime  Dr Merlinda FrederickMonroe's office will call you to work you in  Sooner than Jun 16th  If your thougths get worse,  Please have your husband take you to the ER

## 2014-06-04 DIAGNOSIS — F3130 Bipolar disorder, current episode depressed, mild or moderate severity, unspecified: Secondary | ICD-10-CM | POA: Insufficient documentation

## 2014-06-04 NOTE — Assessment & Plan Note (Signed)
Spent 40 minutes with patient today assessing suicidality, reviewing medications with psychiatrist's office and making corrections with patient .Patient is abel to contract for safety and will follow up with psychiatry.

## 2014-06-11 ENCOUNTER — Ambulatory Visit (INDEPENDENT_AMBULATORY_CARE_PROVIDER_SITE_OTHER): Payer: 59 | Admitting: Psychology

## 2014-06-11 DIAGNOSIS — F3181 Bipolar II disorder: Secondary | ICD-10-CM

## 2014-06-11 DIAGNOSIS — F603 Borderline personality disorder: Secondary | ICD-10-CM

## 2014-06-16 ENCOUNTER — Encounter: Payer: Self-pay | Admitting: Internal Medicine

## 2014-06-16 ENCOUNTER — Ambulatory Visit (INDEPENDENT_AMBULATORY_CARE_PROVIDER_SITE_OTHER): Payer: 59 | Admitting: Internal Medicine

## 2014-06-16 VITALS — BP 122/82 | HR 74 | Temp 98.8°F | Resp 14 | Ht 64.0 in | Wt 245.5 lb

## 2014-06-16 DIAGNOSIS — Z79899 Other long term (current) drug therapy: Secondary | ICD-10-CM | POA: Diagnosis not present

## 2014-06-16 DIAGNOSIS — F3130 Bipolar disorder, current episode depressed, mild or moderate severity, unspecified: Secondary | ICD-10-CM

## 2014-06-16 DIAGNOSIS — R200 Anesthesia of skin: Secondary | ICD-10-CM

## 2014-06-16 DIAGNOSIS — R202 Paresthesia of skin: Secondary | ICD-10-CM

## 2014-06-16 DIAGNOSIS — M5441 Lumbago with sciatica, right side: Secondary | ICD-10-CM

## 2014-06-16 DIAGNOSIS — E559 Vitamin D deficiency, unspecified: Secondary | ICD-10-CM

## 2014-06-16 LAB — COMPREHENSIVE METABOLIC PANEL
ALK PHOS: 38 U/L — AB (ref 39–117)
ALT: 24 U/L (ref 0–35)
AST: 19 U/L (ref 0–37)
Albumin: 4.2 g/dL (ref 3.5–5.2)
BUN: 21 mg/dL (ref 6–23)
CO2: 25 mEq/L (ref 19–32)
CREATININE: 0.85 mg/dL (ref 0.40–1.20)
Calcium: 9.7 mg/dL (ref 8.4–10.5)
Chloride: 104 mEq/L (ref 96–112)
GFR: 72.2 mL/min (ref >60.00)
Glucose, Bld: 94 mg/dL (ref 70–99)
Potassium: 4.3 mEq/L (ref 3.5–5.1)
SODIUM: 138 meq/L (ref 135–145)
Total Bilirubin: 0.3 mg/dL (ref 0.2–1.2)
Total Protein: 6.6 g/dL (ref 6.0–8.3)

## 2014-06-16 LAB — VITAMIN B12: Vitamin B-12: 355 pg/mL (ref 211–911)

## 2014-06-16 LAB — TSH: TSH: 0.49 u[IU]/mL (ref 0.35–4.50)

## 2014-06-16 LAB — HEMOGLOBIN A1C: HEMOGLOBIN A1C: 5.6 % (ref 4.6–6.5)

## 2014-06-16 LAB — VITAMIN D 25 HYDROXY (VIT D DEFICIENCY, FRACTURES): VITD: 22.98 ng/mL — ABNORMAL LOW (ref 30.00–100.00)

## 2014-06-16 MED ORDER — HYDROCODONE-ACETAMINOPHEN 10-325 MG PO TABS
1.0000 | ORAL_TABLET | Freq: Three times a day (TID) | ORAL | Status: DC | PRN
Start: 2014-06-16 — End: 2015-09-24

## 2014-06-16 MED ORDER — PREDNISONE 10 MG PO TABS: ORAL_TABLET | ORAL | Status: DC

## 2014-06-16 NOTE — Progress Notes (Signed)
Subjective:  Patient ID: [name], female    DOB: 1955/12/15  Age: 59 y.o. MRN: [id number]  CC: The primary encounter diagnosis was Midline low back pain with right-sided sciatica. Diagnoses of Numbness and tingling of both legs, Vitamin D deficiency, Long-term use of high-risk medication, and Bipolar I disorder, most recent episode depressed were also pertinent to this visit.  HPI [name] presents for  Follow up on worsening depressive episode without suicidality.  Patient stats that she is feeling much better since her medication changes were put in place by her psychiatrist after last week's visit her and call to psychiatrist.  The stellazine  has been stopped and the lexapro dose is being reduced gradually.  She is calm today and endorses an improved outlook.   She had a recent episode of sciatica which resulted in temporary bilateral numbness of  Both feet  The symptoms were brought on by housework,  Finally took a 10 mg vicoidn when the tizandine did not relieve her pain.  She is travelling cross country with her daughter to see her mother in [place] and is concerned she mayhave another flare while on the road. She is requesting a refill of the vicodin ,  Outpatient Prescriptions Prior to Visit  Medication Sig Dispense Refill  . albuterol (PROVENTIL HFA;VENTOLIN HFA) 108 (90 BASE) MCG/ACT inhaler Inhale 2 puffs into the lungs every 6 (six) hours as needed. 3.7 g 3  . benztropine (COGENTIN) 1 MG tablet Take 1 mg by mouth 2 (two) times daily as needed.   0  . clonazePAM (KLONOPIN) 1 MG tablet Take 0.5 mg by mouth 3 (three) times daily as needed.     . Diclofenac Sodium 3 % GEL Diclofenac,baclofen, cyclobenzaprine, lidocaine gel to affected area twice daily 100 g 1  . escitalopram (LEXAPRO) 20 MG tablet TAKE 1 TABLET BY MOUTH DAILY 30 tablet 0  . furosemide (LASIX) 20 MG tablet Take 1 tablet (20 mg total) by mouth 2 (two) times daily. (Patient taking differently: Take 20 mg by mouth 2 (two)  times daily as needed. ) 30 tablet 6  . hydrocortisone (CORTEF) 5 MG tablet Take by mouth 10 mg in am and 5 mg in pm 100 tablet 11  . hydrocortisone (CORTEF) 5 MG tablet TAKE 2 TABLETS BY MOUTH IN THE MORNING AND TAKE 1 TABLET BY MOUTH IN THE EVENING 100 tablet 10  . levothyroxine (SYNTHROID, LEVOTHROID) 88 MCG tablet TAKE 1 TABLET BY MOUTH EVERY DAY 90 tablet 2  . lubiprostone (AMITIZA) 8 MCG capsule Take 18 mcg by mouth daily.    . ondansetron (ZOFRAN-ODT) 4 MG disintegrating tablet Take 1 tablet (4 mg total) by mouth every 8 (eight) hours as needed for nausea or vomiting. 20 tablet 3  . tizanidine (ZANAFLEX) 6 MG capsule TAKE 1 CAPSULE (6 MG TOTAL) BY MOUTH 3 (THREE) TIMES DAILY AS NEEDED FOR MUSCLE SPASMS. 60 capsule 0  . Vitamin D, Ergocalciferol, (DRISDOL) 50000 UNITS CAPS capsule TAKE 1 CAPSULE (50,000 UNITS TOTAL) BY MOUTH ONCE A WEEK. 4 capsule 2  . buPROPion (WELLBUTRIN XL) 150 MG 24 hr tablet Take 1 tablet by mouth every morning.    [name] trifluoperazine (STELAZINE) 2 MG tablet Take 1 tablet by mouth 2 (two) times daily.    . clobetasol (OLUX) 0.05 % topical foam Apply topically 2 (two) times daily. (Patient not taking: Reported on 03/03/2014) 50 g 2  . dextroamphetamine (DEXEDRINE SPANSULE) 15 MG 24 hr capsule Take 15 mg by mouth daily.     [name]  LATUDA 60 MG TABS Take 1 tablet by mouth daily.     No facility-administered medications prior to visit.    Review of Systems;  Patient denies headache, fevers, malaise, unintentional weight loss, skin rash, eye pain, sinus congestion and sinus pain, sore throat, dysphagia,  hemoptysis , cough, dyspnea, wheezing, chest pain, palpitations, orthopnea, edema, abdominal pain, nausea, melena, diarrhea, constipation, flank pain, dysuria, hematuria, urinary  Frequency, nocturia, numbness, tingling, seizures,  Focal weakness, Loss of consciousness,  Tremor, insomnia, depression, anxiety, and suicidal ideation.      Objective:  BP 122/82 mmHg  Pulse 74   Temp(Src) 98.8 F (37.1 C) (Oral)  Resp 14  Ht  (1.626 m)  Wt 245 lb 8 oz (111.358 kg)  BMI 42.12 kg/m2  SpO2 96%  BP Readings from Last 3 Encounters:  06/16/14 122/82  06/03/14 98/64  03/03/14 128/86    Wt Readings from Last 3 Encounters:  06/16/14 245 lb 8 oz (111.358 kg)  03/03/14 240 lb 12 oz (109.203 kg)  12/18/13 236 lb (107.049 kg)    General appearance: alert, cooperative and appears stated age Ears: normal TM's and external ear canals both ears Throat: lips, mucosa, and tongue normal; teeth and gums normal Neck: no adenopathy, no carotid bruit, supple, symmetrical, trachea midline and thyroid not enlarged, symmetric, no tenderness/mass/nodules Back: symmetric, no curvature. ROM normal. No CVA tenderness. Lungs: clear to auscultation bilaterally Heart: regular rate and rhythm, S1, S2 normal, no murmur, click, rub or gallop Abdomen: soft, non-tender; bowel sounds normal; no masses,  no organomegaly Pulses: 2+ and symmetric Skin: Skin color, texture, turgor normal. No rashes or lesions Lymph nodes: Cervical, supraclavicular, and axillary nodes normal.  Lab Results  Component Value Date   HGBA1C 5.6 06/16/2014   HGBA1C 5.8 01/23/2012    Lab Results  Component Value Date   CREATININE 0.85 06/16/2014   CREATININE 0.9 12/11/2013   CREATININE 0.98 07/03/2013    Lab Results  Component Value Date   WBC 7.9 12/11/2013   HGB 13.1 12/11/2013   HCT 40.4 12/11/2013   PLT 296.0 12/11/2013   GLUCOSE 94 06/16/2014   CHOL 221* 12/11/2013   TRIG 105.0 12/11/2013   HDL 74.50 12/11/2013   LDLDIRECT 150.4 01/23/2012   LDLCALC 126* 12/11/2013   ALT 24 06/16/2014   AST 19 06/16/2014   NA 138 06/16/2014   K 4.3 06/16/2014   CL 104 06/16/2014   CREATININE 0.85 06/16/2014   BUN 21 06/16/2014   CO2 25 06/16/2014   TSH 0.49 06/16/2014   HGBA1C 5.6 06/16/2014    Mr Brain W Wo Contrast  08/29/2013   CLINICAL DATA:  59 year old female with secondary adrenal  insufficiency. Low cortisol and ACTH. Initial encounter.  BUN and creatinine were obtained on site at [facility] at  [address]  Results:  BUN 20 mg/dL,  Creatinine 1.1 mg/dL.  EXAM: MRI HEAD WITHOUT AND WITH CONTRAST  TECHNIQUE: Multiplanar, multiecho pulse sequences of the brain and surrounding structures were obtained without and with intravenous contrast.  CONTRAST:  10mL MULTIHANCE GADOBENATE DIMEGLUMINE 529 MG/ML IV SOLN  COMPARISON:  [facility] Brain MRI 11/03/2009  FINDINGS: Cerebral volume is not significantly changed. Major intracranial vascular flow voids are stable, dominant distal left vertebral artery. No restricted diffusion to suggest acute infarction. No midline shift, mass effect, evidence of mass lesion, ventriculomegaly, extra-axial collection or acute intracranial hemorrhage. Cervicomedullary junction within normal limits. Negative visualized cervical spine. Wallace Cullens and white matter signal is  within normal limits throughout the brain. Excluding the pituitary region, No abnormal enhancement identified.  Visible internal auditory structures appear normal. Visualized paranasal sinuses and mastoids are clear. Visualized orbit soft tissues are within normal limits. Visualized bone marrow signal is within normal limits. Visualized scalp soft tissues are within normal limits.  Dedicated pituitary imaging. Overall pituitary size and configuration are normal, and appear stable. Normal infundibulum. No suprasellar mass or mass effect. Normal hypothalamus. Normal cavernous sinus. Following contrast, it dynamic and delayed pituitary gland enhancement is within normal limits. No pituitary lesion or mass identified. No abnormal enhancement identified elsewhere.  IMPRESSION: 1. Normal MRI appearance of the pituitary. 2. Stable and normal MRI appearance of the brain.   Electronically Signed   By: Augusto Gamble M.D.   On: 08/29/2013 08:08    Assessment & Plan:   Problem List  Items Addressed This Visit    Bipolar I disorder, most recent episode depressed    Markedly imporved mood today secondary to medication changes made by her psychiatrist.       Lumbago - Primary    RECURRENT AGGRAVATED BY HOUSEWEORK AND PRLONGED SITTING  PREDNISONE TAPER AND PRN VICODIN LIMITED AMOUNT       Relevant Medications   predniSONE (DELTASONE) 10 MG tablet   HYDROcodone-acetaminophen (NORCO) 10-325 MG per tablet    Other Visit Diagnoses    Numbness and tingling of both legs        Relevant Orders    Vitamin B12 (Completed)    Hemoglobin A1c (Completed)    RPR (Completed)    TSH (Completed)    Vitamin D deficiency        Relevant Orders    Vit D  25 hydroxy (rtn osteoporosis monitoring) (Completed)    Long-term use of high-risk medication        Relevant Orders    Comprehensive metabolic panel (Completed)       I have discontinued [name]'s dextroamphetamine and clobetasol. I am also having her start on predniSONE and HYDROcodone-acetaminophen. Additionally, I am having her maintain her furosemide, Diclofenac Sodium, buPROPion, lubiprostone, clonazePAM, albuterol, benztropine, ondansetron, hydrocortisone, escitalopram, levothyroxine, tizanidine, hydrocortisone, Vitamin D (Ergocalciferol), trifluoperazine, and LATUDA.  Meds ordered this encounter  Medications  . LATUDA 80 MG TABS tablet    Sig: Take 80 mg by mouth at bedtime.  . predniSONE (DELTASONE) 10 MG tablet    Sig: 6 tablets on Day 1 , then reduce by 1 tablet daily until gone    Dispense:  21 tablet    Refill:  0  . HYDROcodone-acetaminophen (NORCO) 10-325 MG per tablet    Sig: Take 1 tablet by mouth every 8 (eight) hours as needed.    Dispense:  30 tablet    Refill:  0    Medications Discontinued During This Encounter  Medication Reason  . clobetasol (OLUX) 0.05 % topical foam Completed Course  . dextroamphetamine (DEXEDRINE SPANSULE) 15 MG 24 hr capsule Patient Preference  . LATUDA 60 MG TABS Change  in therapy    Follow-up: No Follow-up on file.   Sherlene Shams, MD

## 2014-06-16 NOTE — Assessment & Plan Note (Signed)
RECURRENT AGGRAVATED BY HOUSEWEORK AND PRLONGED SITTING  PREDNISONE TAPER AND PRN VICODIN LIMITED AMOUNT

## 2014-06-16 NOTE — Patient Instructions (Signed)
I'm glad you are feeling better  I have prescribed a prednisone taper and a short supply of Vicdoin 10 mg to take with you on your car trip for your back .    DO NOT SHARE WITH OTHERS OR COMBINE WITH ALCOHOL.

## 2014-06-16 NOTE — Progress Notes (Signed)
Pre-visit discussion using our clinic review tool. No additional management support is needed unless otherwise documented below in the visit note.  

## 2014-06-17 LAB — RPR

## 2014-06-17 NOTE — Assessment & Plan Note (Signed)
Markedly imporved mood today secondary to medication changes made by her psychiatrist.

## 2014-06-18 ENCOUNTER — Ambulatory Visit (INDEPENDENT_AMBULATORY_CARE_PROVIDER_SITE_OTHER): Payer: 59 | Admitting: Psychology

## 2014-06-18 DIAGNOSIS — F603 Borderline personality disorder: Secondary | ICD-10-CM

## 2014-06-18 DIAGNOSIS — F3181 Bipolar II disorder: Secondary | ICD-10-CM | POA: Diagnosis not present

## 2014-06-20 ENCOUNTER — Other Ambulatory Visit: Payer: Self-pay | Admitting: Internal Medicine

## 2014-06-20 MED ORDER — ERGOCALCIFEROL 1.25 MG (50000 UT) PO CAPS: 50000.0000 [IU] | ORAL_CAPSULE | ORAL | Status: DC

## 2014-06-25 ENCOUNTER — Ambulatory Visit (INDEPENDENT_AMBULATORY_CARE_PROVIDER_SITE_OTHER): Payer: 59 | Admitting: Psychology

## 2014-06-25 DIAGNOSIS — F3181 Bipolar II disorder: Secondary | ICD-10-CM | POA: Diagnosis not present

## 2014-06-25 DIAGNOSIS — F603 Borderline personality disorder: Secondary | ICD-10-CM | POA: Diagnosis not present

## 2014-06-30 ENCOUNTER — Other Ambulatory Visit: Payer: Self-pay | Admitting: Internal Medicine

## 2014-07-02 ENCOUNTER — Ambulatory Visit (INDEPENDENT_AMBULATORY_CARE_PROVIDER_SITE_OTHER): Payer: 59 | Admitting: Psychology

## 2014-07-02 DIAGNOSIS — F603 Borderline personality disorder: Secondary | ICD-10-CM

## 2014-07-02 DIAGNOSIS — F3181 Bipolar II disorder: Secondary | ICD-10-CM

## 2014-07-09 ENCOUNTER — Ambulatory Visit (INDEPENDENT_AMBULATORY_CARE_PROVIDER_SITE_OTHER): Payer: 59 | Admitting: Psychology

## 2014-07-09 DIAGNOSIS — F603 Borderline personality disorder: Secondary | ICD-10-CM | POA: Diagnosis not present

## 2014-07-09 DIAGNOSIS — F3181 Bipolar II disorder: Secondary | ICD-10-CM

## 2014-07-11 ENCOUNTER — Other Ambulatory Visit: Payer: Self-pay | Admitting: Internal Medicine

## 2014-07-11 NOTE — Telephone Encounter (Signed)
Ok to refill,  Refill sent  

## 2014-07-11 NOTE — Telephone Encounter (Signed)
Last OV 6.13.16, last refill 5.2.16.  Please advise refill

## 2014-07-23 ENCOUNTER — Ambulatory Visit (INDEPENDENT_AMBULATORY_CARE_PROVIDER_SITE_OTHER): Payer: 59 | Admitting: Psychology

## 2014-07-23 DIAGNOSIS — F3181 Bipolar II disorder: Secondary | ICD-10-CM | POA: Diagnosis not present

## 2014-07-23 DIAGNOSIS — F603 Borderline personality disorder: Secondary | ICD-10-CM | POA: Diagnosis not present

## 2014-07-28 ENCOUNTER — Encounter: Payer: Self-pay | Admitting: Internal Medicine

## 2014-07-28 ENCOUNTER — Ambulatory Visit (INDEPENDENT_AMBULATORY_CARE_PROVIDER_SITE_OTHER): Payer: 59 | Admitting: Internal Medicine

## 2014-07-28 VITALS — BP 148/86 | HR 97 | Temp 98.3°F | Resp 16 | Ht 64.0 in | Wt 249.0 lb

## 2014-07-28 DIAGNOSIS — E2740 Unspecified adrenocortical insufficiency: Secondary | ICD-10-CM | POA: Diagnosis not present

## 2014-07-28 NOTE — Patient Instructions (Signed)
Please come for labs at 8 am.  Please return in 1 year.  Call me in January 2017 for another set of labs.

## 2014-07-28 NOTE — Progress Notes (Signed)
Patient ID: [name], female   DOB: [birth year], 59 y.o.   MRN: [id number]   HPI  [name] is a 59 y.o.-year-old female, returning for f/u for adrenal insufficiency. She is here with her husband who offers part of the history. Last visit 1 year ago.  She stopped Stelazine. She started Jordan >> initially better >> now more tired, memory issues.   She has restless legs. She is on prn  Norco for RLS - every night.  Reviewed hx: Pt. has been dx with adrenal insufficiency at a hospitalization at [facility], when she presented with hypotension, R flank pain and diarrhea. An am cortisol was checked and this was 0.7 (of note, she did receive dilaudid during the hospitalization).    She was seen by PCP on 04/02/2013, and she was still c/o fatigue,weight loss (15 lbs) and was found to have a low-normal BP: 100/68, P 81. A cortisol was checked at 9 am: 6.5, and an ACTH was 9. She was started on Prednisone: 5 mg >> feels a little more energertic.  Of note, pt has a h/o back pain and had surgery 2003-2004 >> feels like she never recovered after the surgery. Remembers her BP was very low after the Sx. Her pain is better after the Sx., but still hurts. She takes Norco, but not all the time (might go 1 mo w/o them), but has weeks when the back pain is more severe and she takes it consistently then. She also has Oxycodone on her med list, but she does not like taking it >> itching.  Pt was on oral steroid pain for back pain, but not since 12/2012. She has had 3-4 joint steroid injections up to 3 years ago. She is using Clobetasol solution on her head.  After last visit, we checked a pituitary MRI (08/2014) >> no pituitary tumor >> likely her AI is 2/2 previous steroid use vs narcotic use for pain.   She was started on Prednisone by PCP >> we switched to Hydrocortisone (HC) 10 mg in am and 5 mg in pm at last visit >> she is tolerating it well, still feels fatigued, but better.  Last year, we checked a cortisol  and ACTH after stopping steroid for 24h: Component     Latest Ref Rng 07/18/2013  ACTH     7.2 - 63.3 pg/mL 4.1 (L)  Cortisol, Plasma      2.8   We also have a stim test: normal, which is not unusual for central AI.: Component     Latest Ref Rng 05/30/2013 05/30/2013 05/30/2013        11:34 AM 12:10 PM 12:45 PM  IGF1            41-279  161    TSH     0.35 - 4.50 uIU/mL 0.36    Cortisol, Plasma      8.2 18.9 22.6  C206 ACTH     6 - 50 pg/mL 5 (L)    Free T4     0.60 - 1.60 ng/dL 1.19    Prolactin      3.6    FSH      53.8    LH      36.30     She still c/o: - + fatigue - + dizziness - problems concentrating - + weight gain  - no constipation, no nausea - + dry skin - no hair falling - + easy bruising  I reviewed pt's medications, allergies, PMH, social hx,  family hx, and changes were documented in the history of present illness. Otherwise, unchanged from my initial visit note.  ROS: Constitutional: see HPI, + poor sleep Eyes: + blurry vision, no xerophthalmia ENT:no sore throat, no nodules palpated in throat, no dysphagia/odynophagia Cardiovascular: no CP/SOB/no palpitations/+ leg swelling Respiratory: no cough/SOB Gastrointestinal: no N/V/D/C Musculoskeletal: + muscle aches/no joint aches Skin: no rashes, + itching, + easy bruising Neurological: no tremors/numbness/tingling/dizziness, + HA  PE: BP 148/86 mmHg  Pulse 97  Temp(Src) 98.3 F (36.8 C) (Oral)  Resp 16  Ht 5\' 4"  (1.626 m)  Wt 249 lb (112.946 kg)  BMI 42.72 kg/m2  SpO2 95% Wt Readings from Last 3 Encounters:  07/28/14 249 lb (112.946 kg)  06/16/14 245 lb 8 oz (111.358 kg)  03/03/14 240 lb 12 oz (109.203 kg)   Constitutional: overweight, in NAD, full supraclavicular fat pads Eyes: PERRLA, EOMI, no exophthalmos ENT: moist mucous membranes, no thyromegaly, no cervical lymphadenopathy Cardiovascular: RRR, No MRG Respiratory: CTA B Gastrointestinal: abdomen soft, NT, ND, BS+ Musculoskeletal:  no deformities, strength intact in all 4 Skin: moist, warm, no rashes; thin skin; no dark knuckle discoloration Neurological: no tremor with outstretched hands, DTR normal in all 4  ASSESSMENT: 1. 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 Normal MRI appearance of the pituitary. 2. Stable and normal MRI appearance of the brain.  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  - we continued her steroid replacement (switched to hydrocortisone at last visit, since this has a shorter half life, and allows the hypothalamic-pituitary-adrenal axis to recover more easily). We checked her cortisol, ACTH last year >> still abnormal >> we continued the Hydrocortisone. - of note, a cosyntropin stimulation test was normal last year, but this can happen with secondary AI - the other pituitary hormone levels were normal at that time >> we will not repeat them  - will also check an MRI just to make sure there is no pituitary organic cause for her AI - continue HC 10 mg in am and 5 mg in pm, best ~3 pm - will recheck her cortisol and ACTH at 8 am off HC for 25h - if abnormal, will continue [facility] and recheck in 6 more months: Patient Instructions  Please come for labs at 8 am.  Please return in 1 year.  Call me in January 2017 for another set of labs.

## 2014-07-30 ENCOUNTER — Ambulatory Visit: Payer: 59 | Admitting: Psychology

## 2014-08-06 ENCOUNTER — Ambulatory Visit (INDEPENDENT_AMBULATORY_CARE_PROVIDER_SITE_OTHER): Payer: 59 | Admitting: Psychology

## 2014-08-06 DIAGNOSIS — F603 Borderline personality disorder: Secondary | ICD-10-CM | POA: Diagnosis not present

## 2014-08-06 DIAGNOSIS — F3181 Bipolar II disorder: Secondary | ICD-10-CM

## 2014-08-13 ENCOUNTER — Ambulatory Visit (INDEPENDENT_AMBULATORY_CARE_PROVIDER_SITE_OTHER): Payer: 59 | Admitting: Psychology

## 2014-08-13 DIAGNOSIS — F603 Borderline personality disorder: Secondary | ICD-10-CM

## 2014-08-13 DIAGNOSIS — F3181 Bipolar II disorder: Secondary | ICD-10-CM | POA: Diagnosis not present

## 2014-08-20 ENCOUNTER — Ambulatory Visit: Payer: 59 | Admitting: Psychology

## 2014-08-27 ENCOUNTER — Ambulatory Visit (INDEPENDENT_AMBULATORY_CARE_PROVIDER_SITE_OTHER): Payer: 59 | Admitting: Psychology

## 2014-08-27 DIAGNOSIS — F603 Borderline personality disorder: Secondary | ICD-10-CM

## 2014-08-27 DIAGNOSIS — F3181 Bipolar II disorder: Secondary | ICD-10-CM | POA: Diagnosis not present

## 2014-09-03 ENCOUNTER — Ambulatory Visit: Payer: 59 | Admitting: Psychology

## 2014-09-10 ENCOUNTER — Ambulatory Visit (INDEPENDENT_AMBULATORY_CARE_PROVIDER_SITE_OTHER): Payer: 59 | Admitting: Psychology

## 2014-09-10 DIAGNOSIS — F3181 Bipolar II disorder: Secondary | ICD-10-CM

## 2014-09-10 DIAGNOSIS — F603 Borderline personality disorder: Secondary | ICD-10-CM

## 2014-09-17 ENCOUNTER — Ambulatory Visit (INDEPENDENT_AMBULATORY_CARE_PROVIDER_SITE_OTHER): Payer: 59 | Admitting: Psychology

## 2014-09-17 DIAGNOSIS — F603 Borderline personality disorder: Secondary | ICD-10-CM | POA: Diagnosis not present

## 2014-09-17 DIAGNOSIS — F3181 Bipolar II disorder: Secondary | ICD-10-CM | POA: Diagnosis not present

## 2014-09-24 ENCOUNTER — Ambulatory Visit (INDEPENDENT_AMBULATORY_CARE_PROVIDER_SITE_OTHER): Payer: 59 | Admitting: Psychology

## 2014-09-24 DIAGNOSIS — F3181 Bipolar II disorder: Secondary | ICD-10-CM | POA: Diagnosis not present

## 2014-09-24 DIAGNOSIS — F603 Borderline personality disorder: Secondary | ICD-10-CM | POA: Diagnosis not present

## 2014-10-01 ENCOUNTER — Ambulatory Visit (INDEPENDENT_AMBULATORY_CARE_PROVIDER_SITE_OTHER): Payer: 59 | Admitting: Psychology

## 2014-10-01 DIAGNOSIS — F3181 Bipolar II disorder: Secondary | ICD-10-CM

## 2014-10-01 DIAGNOSIS — F603 Borderline personality disorder: Secondary | ICD-10-CM | POA: Diagnosis not present

## 2014-10-08 ENCOUNTER — Ambulatory Visit (INDEPENDENT_AMBULATORY_CARE_PROVIDER_SITE_OTHER): Payer: 59 | Admitting: Psychology

## 2014-10-08 DIAGNOSIS — F3181 Bipolar II disorder: Secondary | ICD-10-CM

## 2014-10-08 DIAGNOSIS — F603 Borderline personality disorder: Secondary | ICD-10-CM

## 2014-10-22 ENCOUNTER — Ambulatory Visit (INDEPENDENT_AMBULATORY_CARE_PROVIDER_SITE_OTHER): Payer: 59 | Admitting: Psychology

## 2014-10-22 DIAGNOSIS — F603 Borderline personality disorder: Secondary | ICD-10-CM

## 2014-10-22 DIAGNOSIS — F3181 Bipolar II disorder: Secondary | ICD-10-CM

## 2014-10-29 ENCOUNTER — Ambulatory Visit (INDEPENDENT_AMBULATORY_CARE_PROVIDER_SITE_OTHER): Payer: 59 | Admitting: Psychology

## 2014-10-29 DIAGNOSIS — F3181 Bipolar II disorder: Secondary | ICD-10-CM | POA: Diagnosis not present

## 2014-10-29 DIAGNOSIS — F603 Borderline personality disorder: Secondary | ICD-10-CM

## 2014-11-05 ENCOUNTER — Ambulatory Visit: Payer: 59 | Admitting: Psychology

## 2014-11-12 ENCOUNTER — Ambulatory Visit (INDEPENDENT_AMBULATORY_CARE_PROVIDER_SITE_OTHER): Payer: 59 | Admitting: Psychology

## 2014-11-12 DIAGNOSIS — F3181 Bipolar II disorder: Secondary | ICD-10-CM | POA: Diagnosis not present

## 2014-11-12 DIAGNOSIS — F603 Borderline personality disorder: Secondary | ICD-10-CM | POA: Diagnosis not present

## 2014-11-26 ENCOUNTER — Ambulatory Visit: Payer: 59 | Admitting: Psychology

## 2014-12-03 ENCOUNTER — Ambulatory Visit: Payer: 59 | Admitting: Psychology

## 2014-12-05 IMAGING — US ABDOMEN ULTRASOUND LIMITED
1 series · 14 of 25 positions shown · non-contrast
Comparison: none

REASON FOR EXAM: epigasti and ruq pain
COMMENTS:   Body Site: GB and Fossa, CBD, Head of Pancreas; Right Upper Quad

PROCEDURE:     US  - US ABDOMEN LIMITED SURVEY  - June 23, 2012  [DATE]
RESULT:     Right or quadrant abdominal ultrasound dated 06/23/2012.

[Series 1: abdomen ultrasound limited · 0.31mm/px · 14 of 58 slices shown]
[im 1/58]
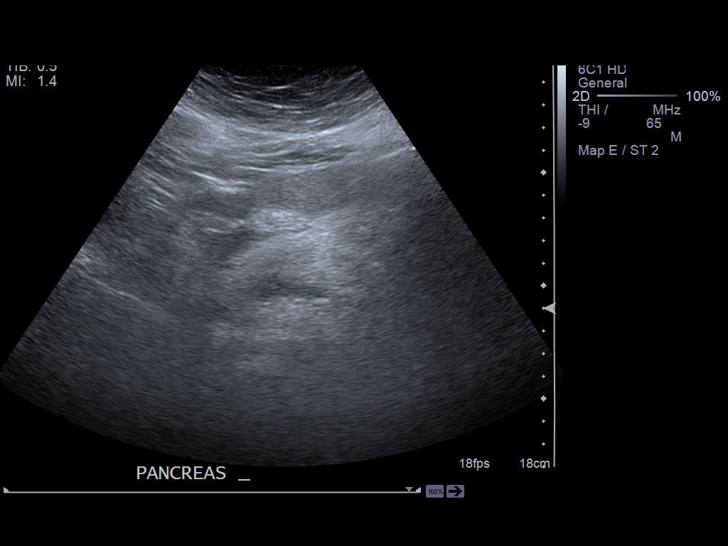
[im 5/58]
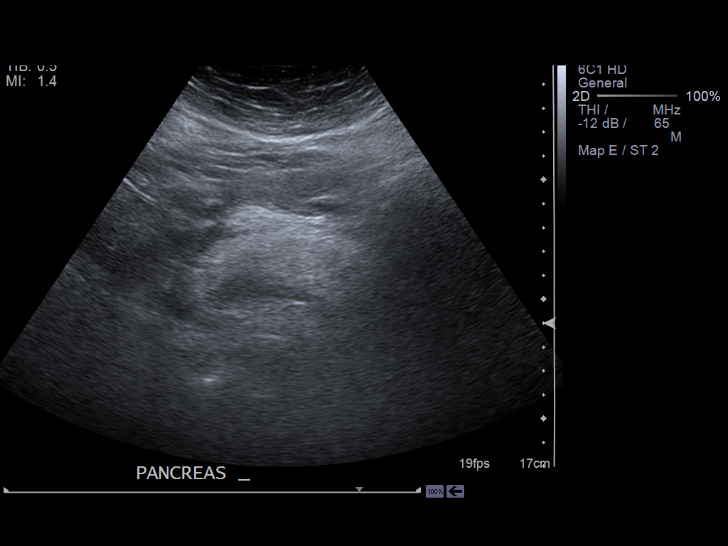
[im 10/58]
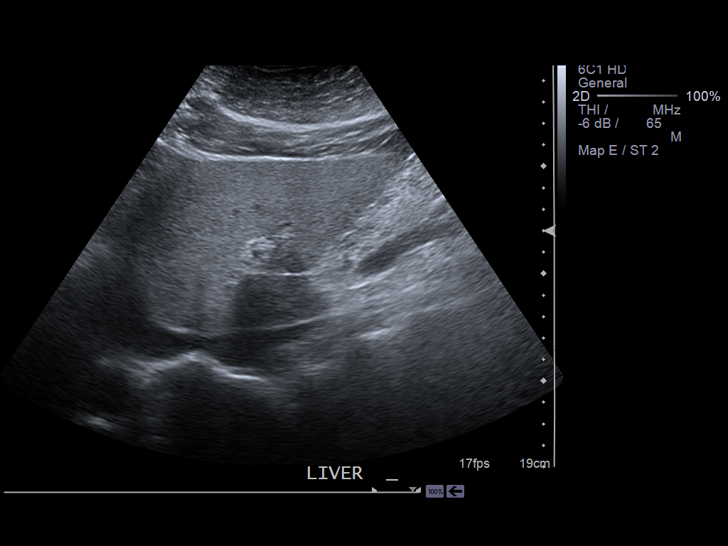
[im 15/58]
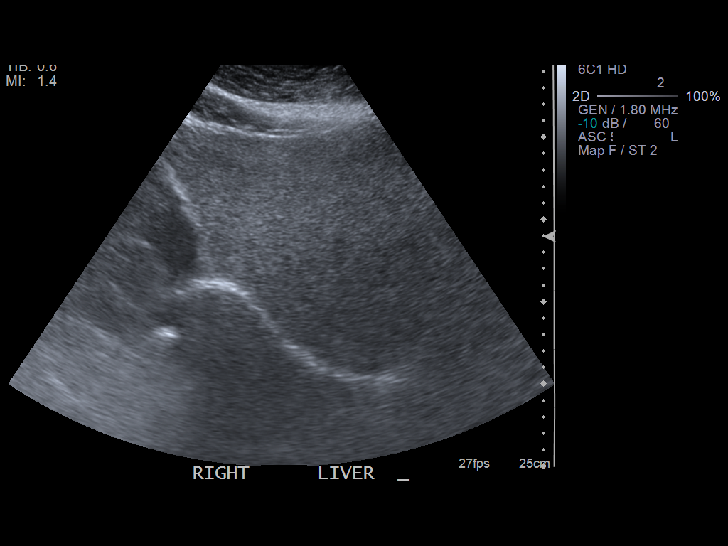
[im 20/58]
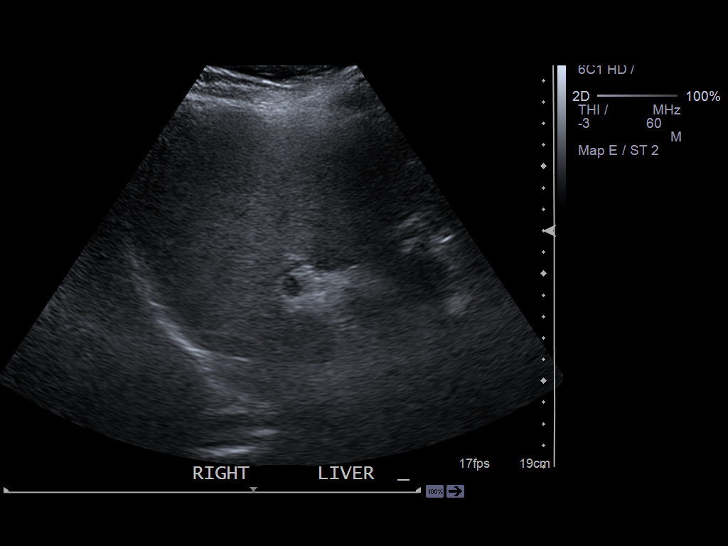
[im 22/58]
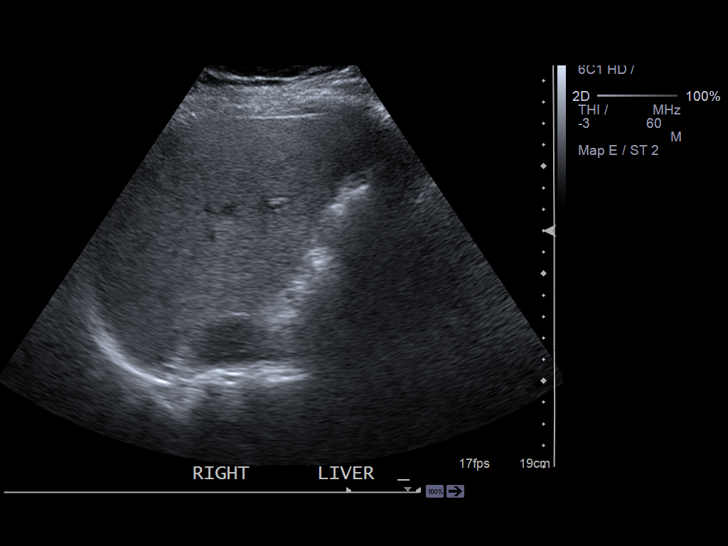
[im 27/58]
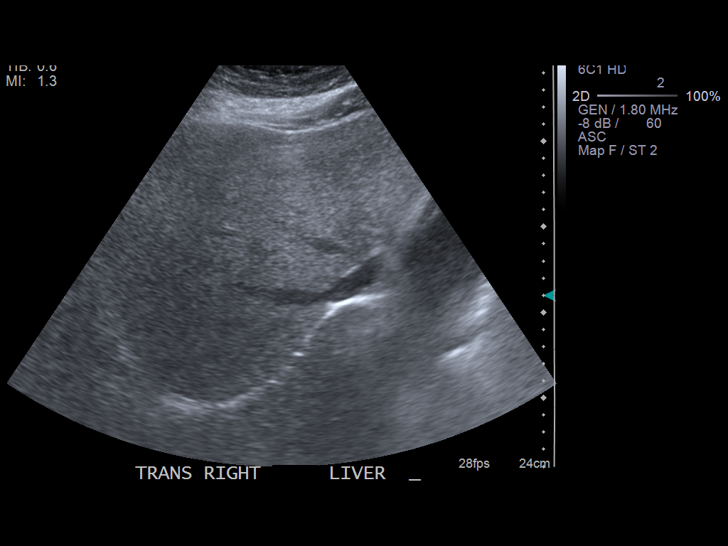
[im 31/58]
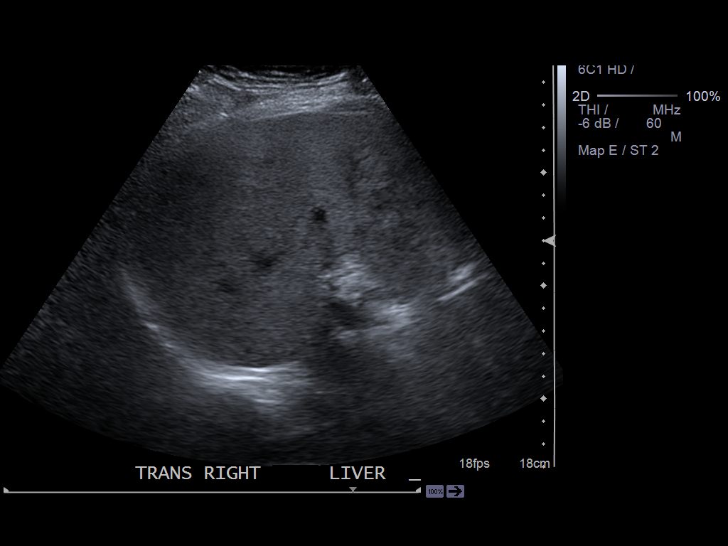
[im 36/58]
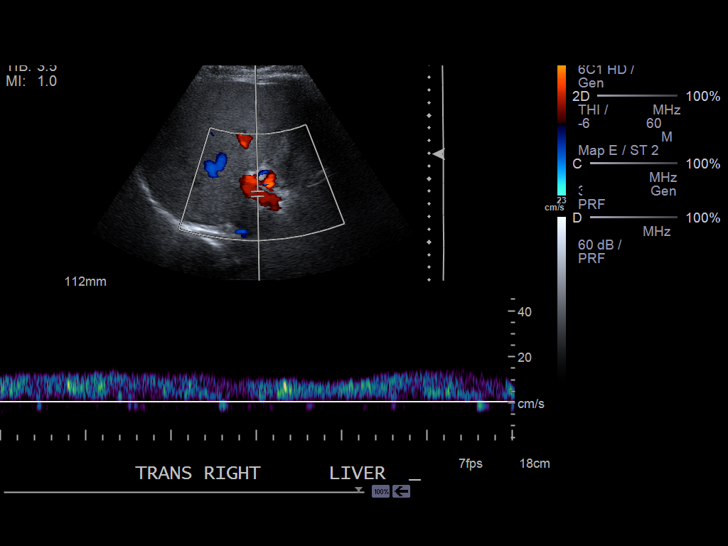
[im 39/58]
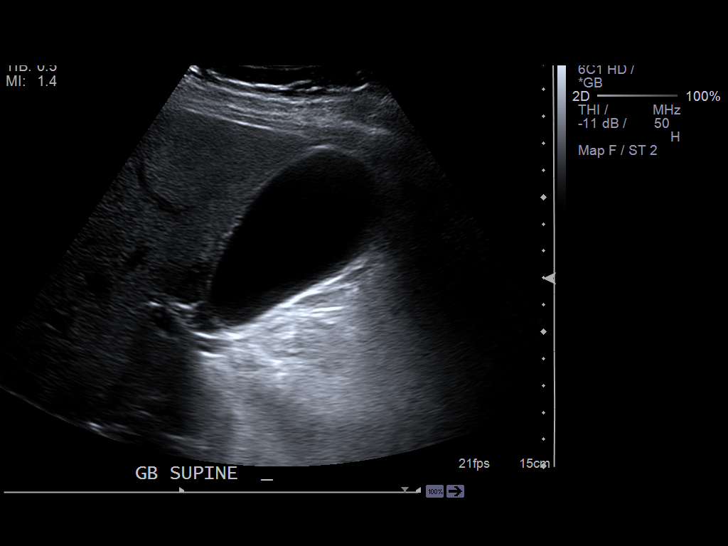
[im 43/58]
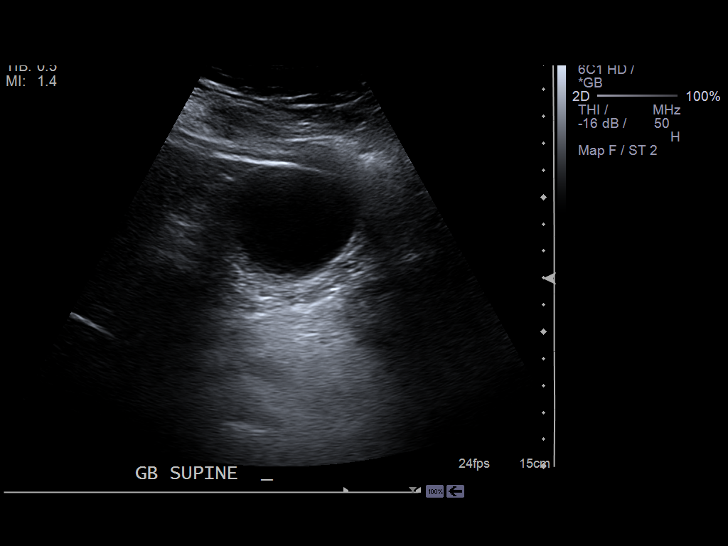
[im 48/58]
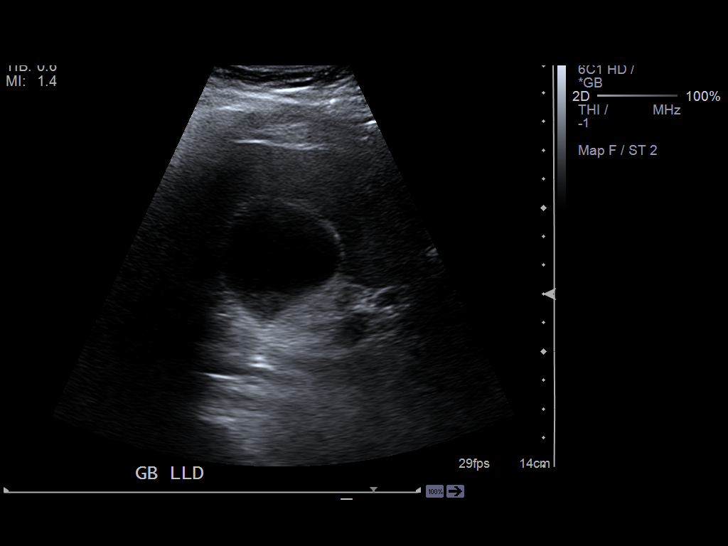
[im 53/58]
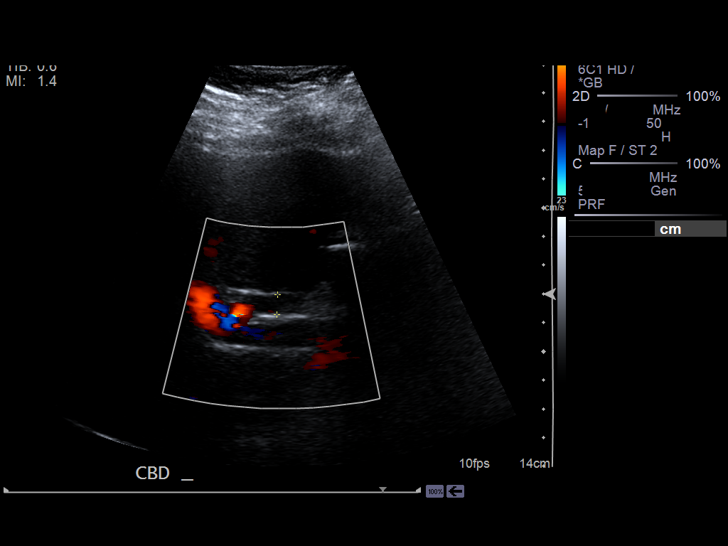
[im 58/58]
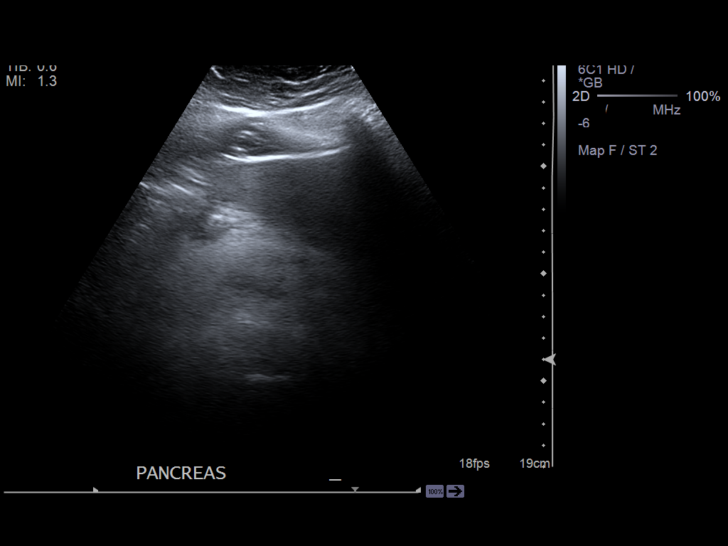

[14 of 25 positions shown; findings below may reference images not displayed]

FINDINGS: The liver demonstrates a diffuse echogenic architecture. Hepato-
pedal flow is appreciated within the portal vein. Liver measures 15.77 cm
along the midclavicular line. Limited evaluation of the pancreas
unremarkable. The gallbladder fossa demonstrates no evidence of
pericholecystic fluid, gallstones, sludging common nor a sonographic
Murphy's sign. Gallbladder wall thickness is within normal limits at 2 mm
and the common bile duct at 6.9 mm.
IMPRESSION: Hepatic steatosis without evidence of findings reflecting
cholecystitis.

## 2014-12-10 ENCOUNTER — Ambulatory Visit (INDEPENDENT_AMBULATORY_CARE_PROVIDER_SITE_OTHER): Payer: 59 | Admitting: Psychology

## 2014-12-10 DIAGNOSIS — F603 Borderline personality disorder: Secondary | ICD-10-CM | POA: Diagnosis not present

## 2014-12-10 DIAGNOSIS — F3181 Bipolar II disorder: Secondary | ICD-10-CM

## 2014-12-17 ENCOUNTER — Ambulatory Visit: Payer: 59 | Admitting: Psychology

## 2014-12-24 ENCOUNTER — Ambulatory Visit (INDEPENDENT_AMBULATORY_CARE_PROVIDER_SITE_OTHER): Payer: 59 | Admitting: Psychology

## 2014-12-24 DIAGNOSIS — F3181 Bipolar II disorder: Secondary | ICD-10-CM | POA: Diagnosis not present

## 2014-12-24 DIAGNOSIS — F603 Borderline personality disorder: Secondary | ICD-10-CM | POA: Diagnosis not present

## 2015-01-13 ENCOUNTER — Other Ambulatory Visit: Payer: Self-pay | Admitting: Internal Medicine

## 2015-01-14 ENCOUNTER — Ambulatory Visit: Payer: 59 | Admitting: Psychology

## 2015-01-21 ENCOUNTER — Ambulatory Visit (INDEPENDENT_AMBULATORY_CARE_PROVIDER_SITE_OTHER): Payer: 59 | Admitting: Psychology

## 2015-01-21 DIAGNOSIS — F3181 Bipolar II disorder: Secondary | ICD-10-CM

## 2015-01-21 DIAGNOSIS — F603 Borderline personality disorder: Secondary | ICD-10-CM | POA: Diagnosis not present

## 2015-01-28 ENCOUNTER — Ambulatory Visit (INDEPENDENT_AMBULATORY_CARE_PROVIDER_SITE_OTHER): Payer: 59 | Admitting: Psychology

## 2015-01-28 DIAGNOSIS — F3181 Bipolar II disorder: Secondary | ICD-10-CM

## 2015-01-28 DIAGNOSIS — F603 Borderline personality disorder: Secondary | ICD-10-CM

## 2015-01-29 ENCOUNTER — Other Ambulatory Visit: Payer: Self-pay

## 2015-02-04 ENCOUNTER — Ambulatory Visit (INDEPENDENT_AMBULATORY_CARE_PROVIDER_SITE_OTHER): Payer: 59 | Admitting: Psychology

## 2015-02-04 DIAGNOSIS — F3181 Bipolar II disorder: Secondary | ICD-10-CM | POA: Diagnosis not present

## 2015-02-04 DIAGNOSIS — F603 Borderline personality disorder: Secondary | ICD-10-CM

## 2015-02-11 ENCOUNTER — Ambulatory Visit: Payer: 59 | Admitting: Psychology

## 2015-02-12 ENCOUNTER — Ambulatory Visit: Payer: Self-pay | Admitting: Internal Medicine

## 2015-02-18 ENCOUNTER — Ambulatory Visit: Payer: 59 | Admitting: Psychology

## 2015-02-25 ENCOUNTER — Ambulatory Visit: Payer: 59 | Admitting: Psychology

## 2015-03-04 ENCOUNTER — Ambulatory Visit: Payer: 59 | Admitting: Psychology

## 2015-03-11 ENCOUNTER — Ambulatory Visit (INDEPENDENT_AMBULATORY_CARE_PROVIDER_SITE_OTHER): Payer: 59 | Admitting: Psychology

## 2015-03-11 DIAGNOSIS — F603 Borderline personality disorder: Secondary | ICD-10-CM | POA: Diagnosis not present

## 2015-03-11 DIAGNOSIS — F3181 Bipolar II disorder: Secondary | ICD-10-CM | POA: Diagnosis not present

## 2015-03-18 ENCOUNTER — Ambulatory Visit: Payer: 59 | Admitting: Psychology

## 2015-03-25 ENCOUNTER — Ambulatory Visit: Payer: 59 | Admitting: Psychology

## 2015-04-01 ENCOUNTER — Ambulatory Visit (INDEPENDENT_AMBULATORY_CARE_PROVIDER_SITE_OTHER): Payer: 59 | Admitting: Psychology

## 2015-04-01 DIAGNOSIS — F3181 Bipolar II disorder: Secondary | ICD-10-CM | POA: Diagnosis not present

## 2015-04-01 DIAGNOSIS — F603 Borderline personality disorder: Secondary | ICD-10-CM

## 2015-04-08 ENCOUNTER — Ambulatory Visit: Payer: 59 | Admitting: Psychology

## 2015-04-09 ENCOUNTER — Other Ambulatory Visit: Payer: Self-pay | Admitting: Internal Medicine

## 2015-04-15 ENCOUNTER — Ambulatory Visit (INDEPENDENT_AMBULATORY_CARE_PROVIDER_SITE_OTHER): Payer: 59 | Admitting: Psychology

## 2015-04-15 DIAGNOSIS — F603 Borderline personality disorder: Secondary | ICD-10-CM

## 2015-04-15 DIAGNOSIS — F3181 Bipolar II disorder: Secondary | ICD-10-CM | POA: Diagnosis not present

## 2015-04-18 ENCOUNTER — Other Ambulatory Visit: Payer: Self-pay | Admitting: Internal Medicine

## 2015-04-22 ENCOUNTER — Ambulatory Visit: Payer: 59 | Admitting: Psychology

## 2015-04-29 ENCOUNTER — Ambulatory Visit (INDEPENDENT_AMBULATORY_CARE_PROVIDER_SITE_OTHER): Payer: 59 | Admitting: Psychology

## 2015-04-29 DIAGNOSIS — F3181 Bipolar II disorder: Secondary | ICD-10-CM

## 2015-04-29 DIAGNOSIS — F603 Borderline personality disorder: Secondary | ICD-10-CM

## 2015-05-06 ENCOUNTER — Ambulatory Visit: Payer: 59 | Admitting: Psychology

## 2015-05-13 ENCOUNTER — Ambulatory Visit (INDEPENDENT_AMBULATORY_CARE_PROVIDER_SITE_OTHER): Payer: 59 | Admitting: Psychology

## 2015-05-13 DIAGNOSIS — F603 Borderline personality disorder: Secondary | ICD-10-CM

## 2015-05-13 DIAGNOSIS — F3181 Bipolar II disorder: Secondary | ICD-10-CM

## 2015-05-20 ENCOUNTER — Ambulatory Visit (INDEPENDENT_AMBULATORY_CARE_PROVIDER_SITE_OTHER): Payer: 59 | Admitting: Psychology

## 2015-05-20 DIAGNOSIS — F319 Bipolar disorder, unspecified: Secondary | ICD-10-CM | POA: Diagnosis not present

## 2015-05-27 ENCOUNTER — Ambulatory Visit (INDEPENDENT_AMBULATORY_CARE_PROVIDER_SITE_OTHER): Payer: 59 | Admitting: Psychology

## 2015-05-27 DIAGNOSIS — F603 Borderline personality disorder: Secondary | ICD-10-CM | POA: Diagnosis not present

## 2015-05-27 DIAGNOSIS — F3181 Bipolar II disorder: Secondary | ICD-10-CM | POA: Diagnosis not present

## 2015-06-03 ENCOUNTER — Ambulatory Visit (INDEPENDENT_AMBULATORY_CARE_PROVIDER_SITE_OTHER): Payer: 59 | Admitting: Psychology

## 2015-06-03 DIAGNOSIS — F319 Bipolar disorder, unspecified: Secondary | ICD-10-CM

## 2015-06-03 DIAGNOSIS — F603 Borderline personality disorder: Secondary | ICD-10-CM

## 2015-06-17 ENCOUNTER — Ambulatory Visit (INDEPENDENT_AMBULATORY_CARE_PROVIDER_SITE_OTHER): Payer: 59 | Admitting: Psychology

## 2015-06-17 DIAGNOSIS — F603 Borderline personality disorder: Secondary | ICD-10-CM

## 2015-06-17 DIAGNOSIS — F319 Bipolar disorder, unspecified: Secondary | ICD-10-CM | POA: Diagnosis not present

## 2015-06-24 ENCOUNTER — Ambulatory Visit: Payer: 59 | Admitting: Psychology

## 2015-07-01 ENCOUNTER — Ambulatory Visit (INDEPENDENT_AMBULATORY_CARE_PROVIDER_SITE_OTHER): Payer: 59 | Admitting: Psychology

## 2015-07-01 DIAGNOSIS — F3181 Bipolar II disorder: Secondary | ICD-10-CM | POA: Diagnosis not present

## 2015-07-01 DIAGNOSIS — F603 Borderline personality disorder: Secondary | ICD-10-CM

## 2015-07-08 ENCOUNTER — Ambulatory Visit (INDEPENDENT_AMBULATORY_CARE_PROVIDER_SITE_OTHER): Payer: 59 | Admitting: Psychology

## 2015-07-08 DIAGNOSIS — F603 Borderline personality disorder: Secondary | ICD-10-CM

## 2015-07-08 DIAGNOSIS — F3181 Bipolar II disorder: Secondary | ICD-10-CM | POA: Diagnosis not present

## 2015-07-15 ENCOUNTER — Ambulatory Visit: Payer: 59 | Admitting: Psychology

## 2015-07-22 ENCOUNTER — Other Ambulatory Visit: Payer: Self-pay

## 2015-07-22 ENCOUNTER — Ambulatory Visit: Payer: 59 | Admitting: Psychology

## 2015-07-22 NOTE — Telephone Encounter (Signed)
Please advise refill, labs were done over a year ago, thanks

## 2015-07-23 MED ORDER — LEVOTHYROXINE SODIUM 88 MCG PO TABS: 88.0000 ug | ORAL_TABLET | Freq: Every day | ORAL | Status: DC

## 2015-07-23 NOTE — Telephone Encounter (Signed)
RefillED.  OFFICE VISIT NEEDED prior to any more refills. Please call patient and scheudle fasting labs and ov

## 2015-07-24 ENCOUNTER — Telehealth: Payer: Self-pay

## 2015-07-24 NOTE — Telephone Encounter (Signed)
Patient needs fasting labs and an appt scheduled, thanks

## 2015-07-24 NOTE — Telephone Encounter (Signed)
Ok. I called pt and left vm to call office. Thank you!

## 2015-07-24 NOTE — Telephone Encounter (Addendum)
Pt called back and is scheduled to come in 08/14 @ 10:45am and 08/24 @ 10:00am.

## 2015-07-29 ENCOUNTER — Ambulatory Visit (INDEPENDENT_AMBULATORY_CARE_PROVIDER_SITE_OTHER): Payer: 59 | Admitting: Psychology

## 2015-07-29 DIAGNOSIS — F3181 Bipolar II disorder: Secondary | ICD-10-CM

## 2015-07-29 DIAGNOSIS — F603 Borderline personality disorder: Secondary | ICD-10-CM

## 2015-08-05 ENCOUNTER — Ambulatory Visit: Payer: 59 | Admitting: Psychology

## 2015-08-12 ENCOUNTER — Ambulatory Visit (INDEPENDENT_AMBULATORY_CARE_PROVIDER_SITE_OTHER): Payer: 59 | Admitting: Psychology

## 2015-08-12 DIAGNOSIS — F603 Borderline personality disorder: Secondary | ICD-10-CM

## 2015-08-12 DIAGNOSIS — F3181 Bipolar II disorder: Secondary | ICD-10-CM | POA: Diagnosis not present

## 2015-08-13 ENCOUNTER — Telehealth: Payer: Self-pay

## 2015-08-13 ENCOUNTER — Telehealth: Payer: Self-pay | Admitting: Internal Medicine

## 2015-08-13 NOTE — Telephone Encounter (Signed)
Called and notified patient that no other labs were needed, if she needed a stimulation test done we would do that here. Left message and call back number for patient to return call if any questions.

## 2015-08-13 NOTE — Telephone Encounter (Signed)
Patient husband stated wife will be getting some  blood drawn monday at PCP Dr Darrick Huntsmanullo office, ask Dr Elvera LennoxGherghe if she will put order in for what she want,so she can get it drawn there.

## 2015-08-13 NOTE — Telephone Encounter (Signed)
No labs needed for now. I may do a cosyntropin stimulation test when she comes back, but will do this here.

## 2015-08-14 ENCOUNTER — Telehealth: Payer: Self-pay

## 2015-08-14 DIAGNOSIS — I10 Essential (primary) hypertension: Secondary | ICD-10-CM

## 2015-08-14 DIAGNOSIS — E559 Vitamin D deficiency, unspecified: Secondary | ICD-10-CM

## 2015-08-14 DIAGNOSIS — E785 Hyperlipidemia, unspecified: Secondary | ICD-10-CM

## 2015-08-14 DIAGNOSIS — E034 Atrophy of thyroid (acquired): Secondary | ICD-10-CM

## 2015-08-14 DIAGNOSIS — K760 Fatty (change of) liver, not elsewhere classified: Secondary | ICD-10-CM

## 2015-08-14 DIAGNOSIS — Z8719 Personal history of other diseases of the digestive system: Secondary | ICD-10-CM

## 2015-08-14 NOTE — Telephone Encounter (Signed)
Pt coming for labs 08/17/15. Please place future orders. Thank you. 

## 2015-08-17 ENCOUNTER — Other Ambulatory Visit (INDEPENDENT_AMBULATORY_CARE_PROVIDER_SITE_OTHER): Payer: 59

## 2015-08-17 DIAGNOSIS — K760 Fatty (change of) liver, not elsewhere classified: Secondary | ICD-10-CM | POA: Diagnosis not present

## 2015-08-17 DIAGNOSIS — E038 Other specified hypothyroidism: Secondary | ICD-10-CM

## 2015-08-17 DIAGNOSIS — E559 Vitamin D deficiency, unspecified: Secondary | ICD-10-CM | POA: Diagnosis not present

## 2015-08-17 DIAGNOSIS — E785 Hyperlipidemia, unspecified: Secondary | ICD-10-CM | POA: Diagnosis not present

## 2015-08-17 DIAGNOSIS — I10 Essential (primary) hypertension: Secondary | ICD-10-CM | POA: Diagnosis not present

## 2015-08-17 DIAGNOSIS — Z8719 Personal history of other diseases of the digestive system: Secondary | ICD-10-CM | POA: Diagnosis not present

## 2015-08-17 DIAGNOSIS — E034 Atrophy of thyroid (acquired): Secondary | ICD-10-CM

## 2015-08-17 LAB — CBC WITH DIFFERENTIAL/PLATELET
BASOS PCT: 0.6 % (ref 0.0–3.0)
Basophils Absolute: 0.1 10*3/uL (ref 0.0–0.1)
EOS ABS: 1.2 10*3/uL — AB (ref 0.0–0.7)
Eosinophils Relative: 11.1 % — ABNORMAL HIGH (ref 0.0–5.0)
HCT: 43.5 % (ref 36.0–46.0)
Hemoglobin: 14.6 g/dL (ref 12.0–15.0)
Lymphocytes Relative: 25.4 % (ref 12.0–46.0)
Lymphs Abs: 2.7 10*3/uL (ref 0.7–4.0)
MCHC: 33.5 g/dL (ref 30.0–36.0)
MCV: 89.2 fl (ref 78.0–100.0)
MONO ABS: 0.5 10*3/uL (ref 0.1–1.0)
Monocytes Relative: 4.5 % (ref 3.0–12.0)
NEUTROS ABS: 6.2 10*3/uL (ref 1.4–7.7)
NEUTROS PCT: 58.4 % (ref 43.0–77.0)
PLATELETS: 346 10*3/uL (ref 150.0–400.0)
RBC: 4.87 Mil/uL (ref 3.87–5.11)
RDW: 14.2 % (ref 11.5–15.5)
WBC: 10.7 10*3/uL — AB (ref 4.0–10.5)

## 2015-08-17 LAB — LIPID PANEL
CHOL/HDL RATIO: 4
Cholesterol: 216 mg/dL — ABNORMAL HIGH (ref 0–200)
HDL: 51.9 mg/dL (ref >39.00)
LDL Cholesterol: 132 mg/dL — ABNORMAL HIGH (ref 0–99)
NonHDL: 164.14
TRIGLYCERIDES: 162 mg/dL — AB (ref 0.0–149.0)
VLDL: 32.4 mg/dL (ref 0.0–40.0)

## 2015-08-17 LAB — COMPREHENSIVE METABOLIC PANEL
ALBUMIN: 4.3 g/dL (ref 3.5–5.2)
ALT: 33 U/L (ref 0–35)
AST: 18 U/L (ref 0–37)
Alkaline Phosphatase: 46 U/L (ref 39–117)
BILIRUBIN TOTAL: 0.4 mg/dL (ref 0.2–1.2)
BUN: 15 mg/dL (ref 6–23)
CHLORIDE: 106 meq/L (ref 96–112)
CO2: 27 meq/L (ref 19–32)
CREATININE: 0.98 mg/dL (ref 0.40–1.20)
Calcium: 9.8 mg/dL (ref 8.4–10.5)
GFR: 61.03 mL/min (ref >60.00)
Glucose, Bld: 135 mg/dL — ABNORMAL HIGH (ref 70–99)
Potassium: 4.2 mEq/L (ref 3.5–5.1)
SODIUM: 142 meq/L (ref 135–145)
Total Protein: 7.4 g/dL (ref 6.0–8.3)

## 2015-08-17 LAB — HEMOGLOBIN A1C: HEMOGLOBIN A1C: 6.3 % (ref 4.6–6.5)

## 2015-08-17 LAB — TSH: TSH: 2.29 u[IU]/mL (ref 0.35–4.50)

## 2015-08-17 LAB — VITAMIN D 25 HYDROXY (VIT D DEFICIENCY, FRACTURES): VITD: 19.49 ng/mL — AB (ref 30.00–100.00)

## 2015-08-20 IMAGING — US ABDOMEN ULTRASOUND
1 series · 14 of 25 positions shown · non-contrast
Comparison: None.

CLINICAL DATA: Right upper quadrant pain.  Mildly elevated lipase.

EXAM:
ULTRASOUND ABDOMEN COMPLETE

[Series 1: abdomen ultrasound · 0.30mm/px · 14 of 67 slices shown]
[im 1/67]
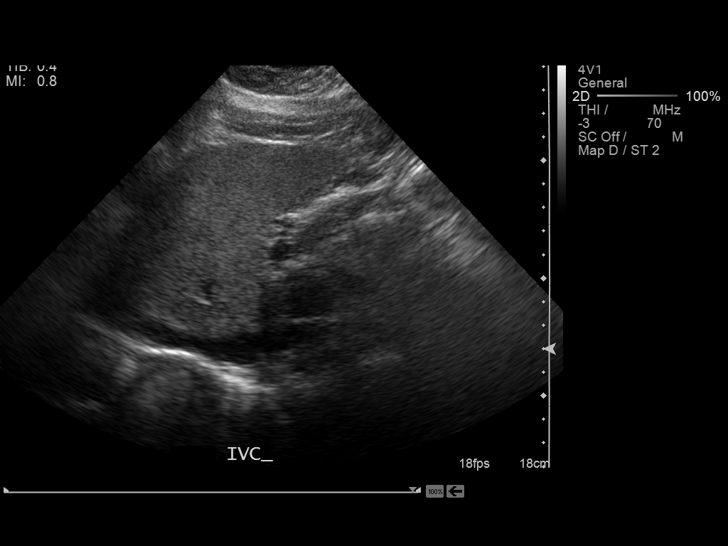
[im 6/67]
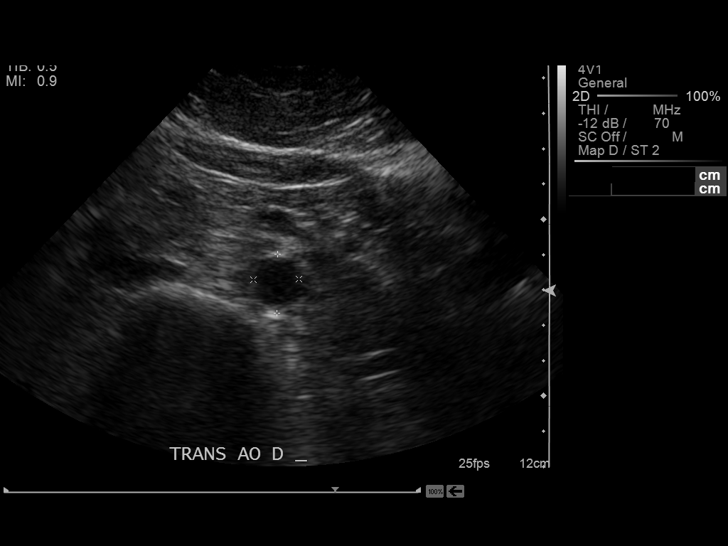
[im 12/67]
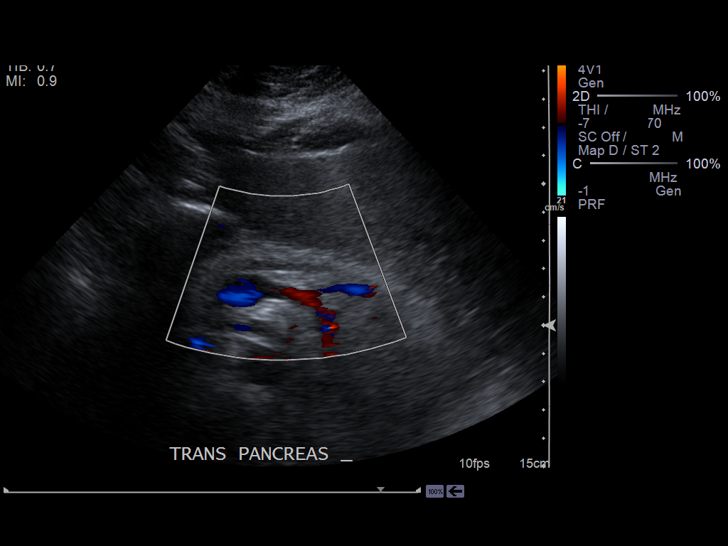
[im 17/67]
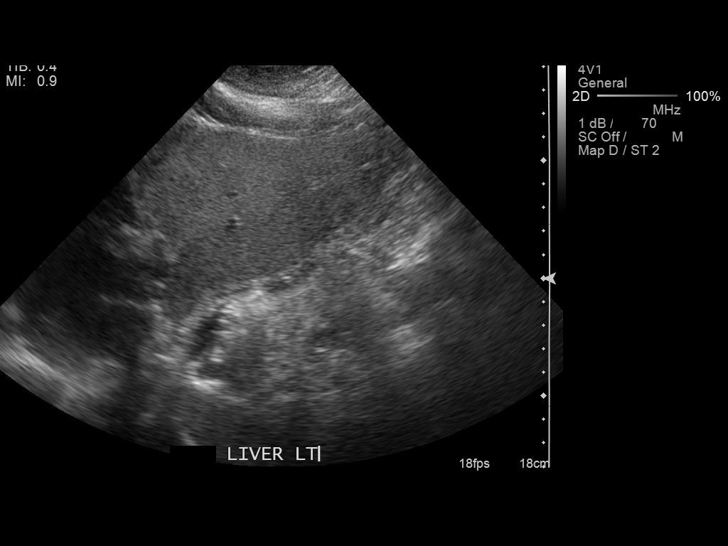
[im 23/67]
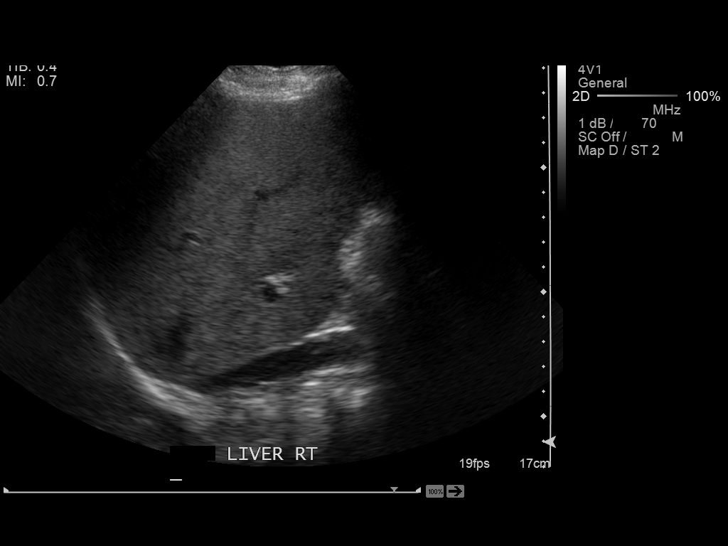
[im 25/67]
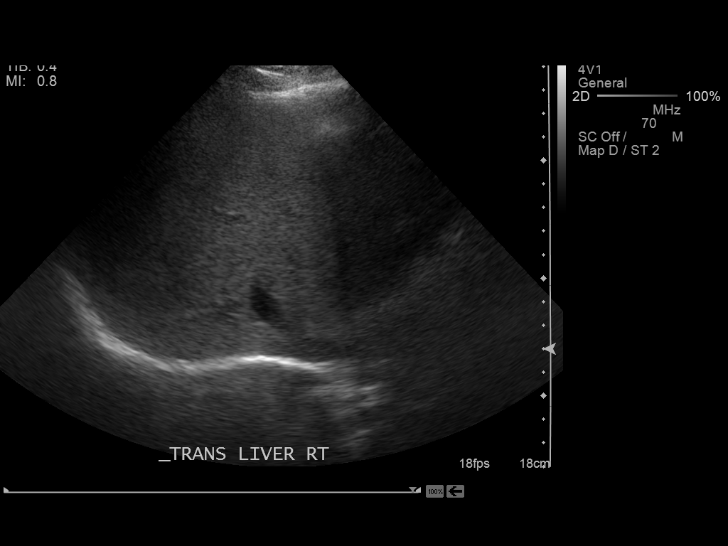
[im 31/67]
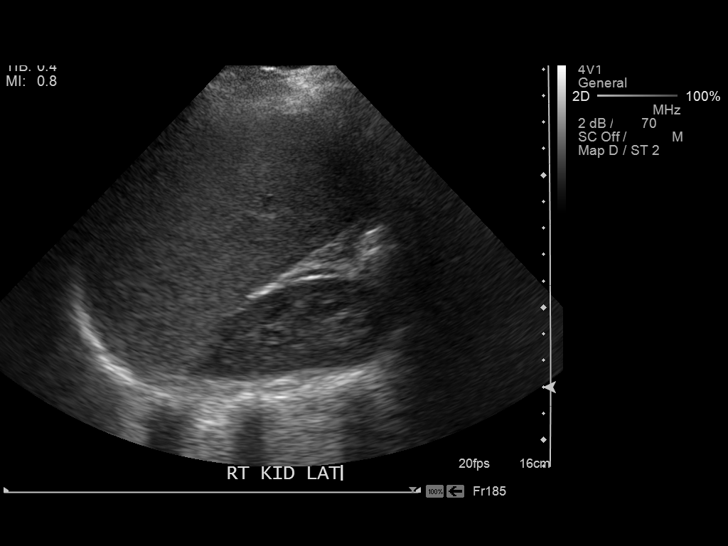
[im 36/67]
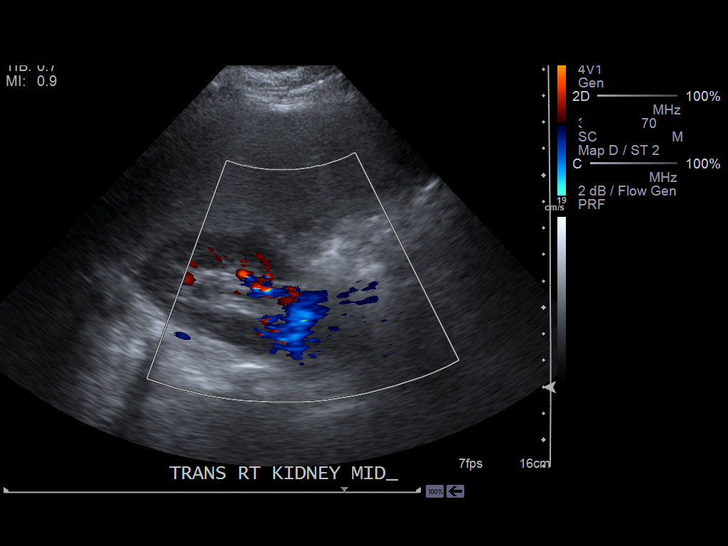
[im 42/67]
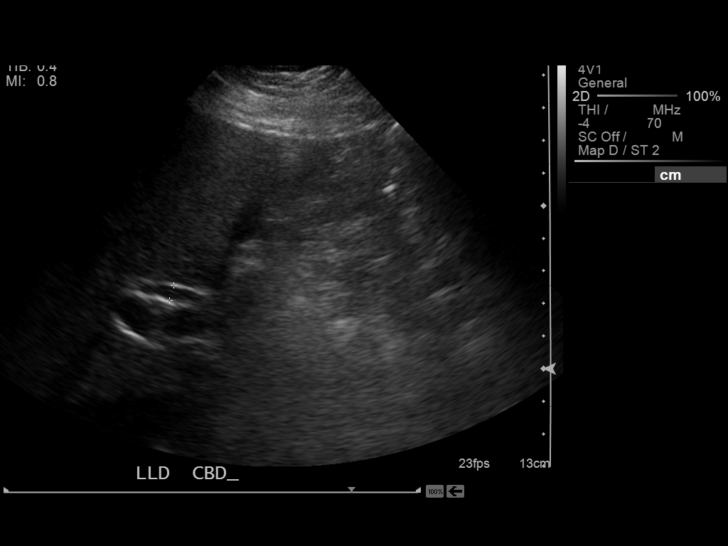
[im 45/67]
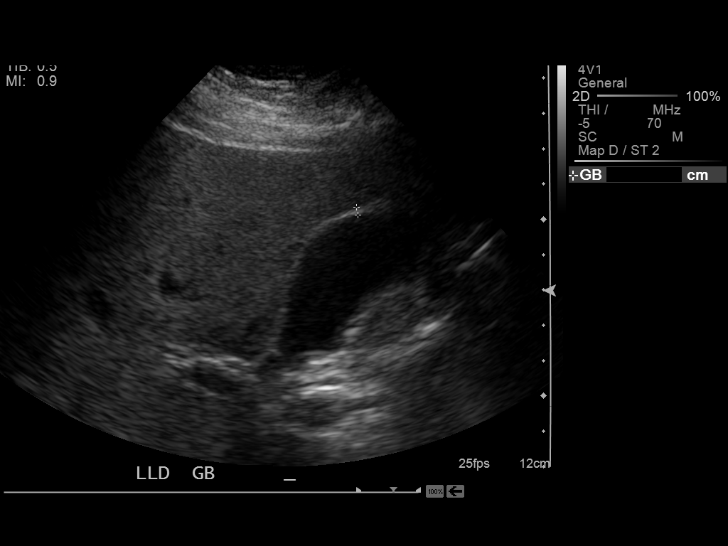
[im 50/67]
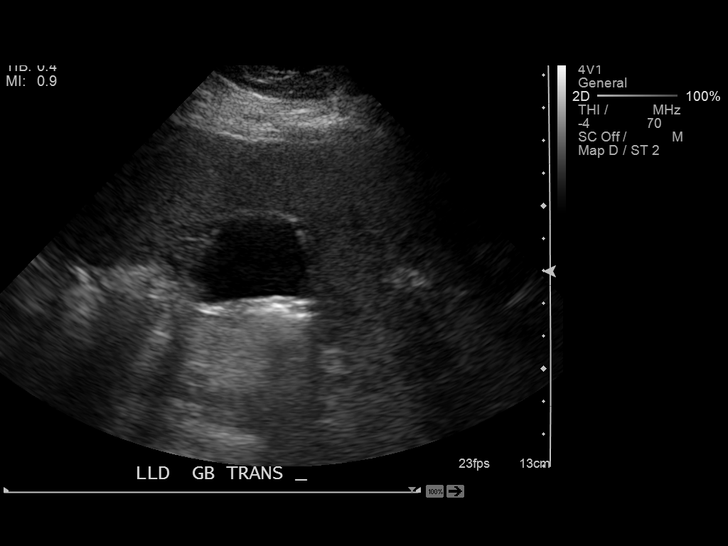
[im 56/67]
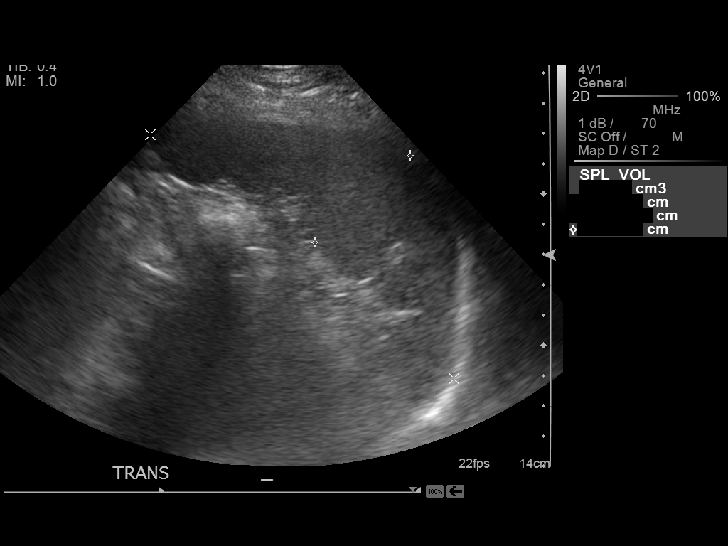
[im 61/67]
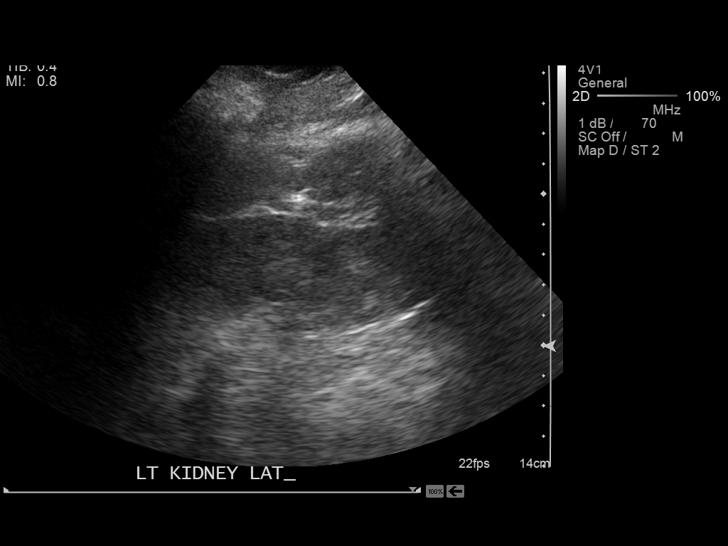
[im 67/67]
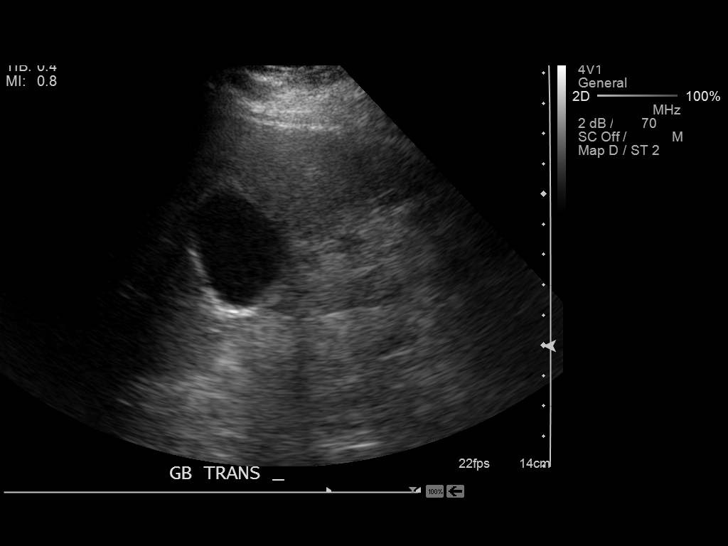

[14 of 25 positions shown; findings below may reference images not displayed]

FINDINGS: Gallbladder:

No gallstones or wall thickening visualized. No sonographic Murphy
sign noted.

Common bile duct:

Diameter: 4.8 mm

Liver:

No focal lesion identified.  Hepatic steatosis.

IVC:

No visualized abnormality.

Pancreas:

No focal pancreatic mass or pseudocyst. Dilated pancreatic duct up
to 3.5 mm consistent with chronic inflammation or previous insult.

Spleen:

Size and appearance within normal limits.

Right Kidney:

Length: 10.8 cm. Echogenicity within normal limits. No mass or
hydronephrosis visualized.

Left Kidney:

Length: 10.5 cm. Echogenicity within normal limits. No mass or
hydronephrosis visualized.

Abdominal aorta:

No aneurysm visualized.

Other findings:

Good general agreement with prior imaging.
IMPRESSION: No acute intra-abdominal findings. Hepatic steatosis. Dilated
pancreatic duct up to 3.5 mm suggesting chronic pancreatitis. No
areas of acute pancreatic inflammation are seen. Gallbladder appears
unremarkable.

## 2015-08-24 ENCOUNTER — Telehealth: Payer: Self-pay | Admitting: Internal Medicine

## 2015-08-24 NOTE — Telephone Encounter (Signed)
[name]'s husband called saying she received a phone call on last Friday saying she needs to come in for an appointment today at 10am. Her chart says her appointment is on Thursday, August 24th at 10:00am. I didn't see anything noted where she received a phone call on Friday instructing her to come in today. Per her husband, she's to come in today due to her recent blood sugar results. Please give them a phone call to confirm.   Pt's ph# [phone] Thank you.

## 2015-08-24 NOTE — Telephone Encounter (Signed)
Spoke with patients husband and advised that appointment is 08/27/15 at 10AM

## 2015-08-26 ENCOUNTER — Ambulatory Visit (INDEPENDENT_AMBULATORY_CARE_PROVIDER_SITE_OTHER): Payer: 59 | Admitting: Psychology

## 2015-08-26 DIAGNOSIS — F3181 Bipolar II disorder: Secondary | ICD-10-CM

## 2015-08-26 DIAGNOSIS — F603 Borderline personality disorder: Secondary | ICD-10-CM | POA: Diagnosis not present

## 2015-08-27 ENCOUNTER — Ambulatory Visit (INDEPENDENT_AMBULATORY_CARE_PROVIDER_SITE_OTHER): Payer: 59 | Admitting: Internal Medicine

## 2015-08-27 ENCOUNTER — Encounter: Payer: Self-pay | Admitting: Internal Medicine

## 2015-08-27 VITALS — BP 124/84 | HR 114 | Temp 98.5°F | Resp 12 | Ht 64.0 in | Wt 250.2 lb

## 2015-08-27 DIAGNOSIS — G259 Extrapyramidal and movement disorder, unspecified: Secondary | ICD-10-CM | POA: Diagnosis not present

## 2015-08-27 DIAGNOSIS — R413 Other amnesia: Secondary | ICD-10-CM

## 2015-08-27 DIAGNOSIS — E2740 Unspecified adrenocortical insufficiency: Secondary | ICD-10-CM

## 2015-08-27 DIAGNOSIS — E034 Atrophy of thyroid (acquired): Secondary | ICD-10-CM

## 2015-08-27 DIAGNOSIS — E038 Other specified hypothyroidism: Secondary | ICD-10-CM

## 2015-08-27 DIAGNOSIS — Z23 Encounter for immunization: Secondary | ICD-10-CM | POA: Diagnosis not present

## 2015-08-27 DIAGNOSIS — E785 Hyperlipidemia, unspecified: Secondary | ICD-10-CM

## 2015-08-27 DIAGNOSIS — R259 Unspecified abnormal involuntary movements: Secondary | ICD-10-CM

## 2015-08-27 DIAGNOSIS — K760 Fatty (change of) liver, not elsewhere classified: Secondary | ICD-10-CM

## 2015-08-27 MED ORDER — FAMOTIDINE 20 MG PO TABS: 20.0000 mg | ORAL_TABLET | Freq: Every day | ORAL | 3 refills | Status: DC

## 2015-08-27 MED ORDER — ATORVASTATIN CALCIUM 20 MG PO TABS: 20.0000 mg | ORAL_TABLET | Freq: Every day | ORAL | 3 refills | Status: DC

## 2015-08-27 NOTE — Patient Instructions (Signed)
Diabetes Mellitus and Food It is important for you to manage your blood sugar (glucose) level. Your blood glucose level can be greatly affected by what you eat. Eating healthier foods in the appropriate amounts throughout the day at about the same time each day will help you control your blood glucose level. It can also help slow or prevent worsening of your diabetes mellitus. Healthy eating may even help you improve the level of your blood pressure and reach or maintain a healthy weight.  General recommendations for healthful eating and cooking habits include:  Eating meals and snacks regularly. Avoid going long periods of time without eating to lose weight.  Eating a diet that consists mainly of plant-based foods, such as fruits, vegetables, nuts, legumes, and whole grains.  Using low-heat cooking methods, such as baking, instead of high-heat cooking methods, such as deep frying. Work with your dietitian to make sure you understand how to use the Nutrition Facts information on food labels. HOW CAN FOOD AFFECT ME? Carbohydrates Carbohydrates affect your blood glucose level more than any other type of food. Your dietitian will help you determine how many carbohydrates to eat at each meal and teach you how to count carbohydrates. Counting carbohydrates is important to keep your blood glucose at a healthy level, especially if you are using insulin or taking certain medicines for diabetes mellitus. Alcohol Alcohol can cause sudden decreases in blood glucose (hypoglycemia), especially if you use insulin or take certain medicines for diabetes mellitus. Hypoglycemia can be a life-threatening condition. Symptoms of hypoglycemia (sleepiness, dizziness, and disorientation) are similar to symptoms of having too much alcohol.  If your health care provider has given you approval to drink alcohol, do so in moderation and use the following guidelines:  Women should not have more than one drink per day, and men  should not have more than two drinks per day. One drink is equal to:  12 oz of beer.  5 oz of wine.  1 oz of hard liquor.  Do not drink on an empty stomach.  Keep yourself hydrated. Have water, diet soda, or unsweetened iced tea.  Regular soda, juice, and other mixers might contain a lot of carbohydrates and should be counted. WHAT FOODS ARE NOT RECOMMENDED? As you make food choices, it is important to remember that all foods are not the same. Some foods have fewer nutrients per serving than other foods, even though they might have the same number of calories or carbohydrates. It is difficult to get your body what it needs when you eat foods with fewer nutrients. Examples of foods that you should avoid that are high in calories and carbohydrates but low in nutrients include:  Trans fats (most processed foods list trans fats on the Nutrition Facts label).  Regular soda.  Juice.  Candy.  Sweets, such as cake, pie, doughnuts, and cookies.  Fried foods. WHAT FOODS CAN I EAT? Eat nutrient-rich foods, which will nourish your body and keep you healthy. The food you should eat also will depend on several factors, including:  The calories you need.  The medicines you take.  Your weight.  Your blood glucose level.  Your blood pressure level.  Your cholesterol level. You should eat a variety of foods, including:  Protein.  Lean cuts of meat.  Proteins low in saturated fats, such as fish, egg whites, and beans. Avoid processed meats.  Fruits and vegetables.  Fruits and vegetables that may help control blood glucose levels, such as apples, mangoes, and   yams.  Dairy products.  Choose fat-free or low-fat dairy products, such as milk, yogurt, and cheese.  Grains, bread, pasta, and rice.  Choose whole grain products, such as multigrain bread, whole oats, and brown rice. These foods may help control blood pressure.  Fats.  Foods containing healthful fats, such as nuts,  avocado, olive oil, canola oil, and fish. DOES EVERYONE WITH DIABETES MELLITUS HAVE THE SAME MEAL PLAN? Because every person with diabetes mellitus is different, there is not one meal plan that works for everyone. It is very important that you meet with a dietitian who will help you create a meal plan that is just right for you.   This information is not intended to replace advice given to you by your health care provider. Make sure you discuss any questions you have with your health care provider.   Document Released: 09/16/2004 Document Revised: 01/10/2014 Document Reviewed: 11/16/2012 [employer] Interactive Patient Education 2016 [employer]  

## 2015-08-27 NOTE — Progress Notes (Signed)
Subjective:  Patient ID: [name], female    DOB: 1955/05/27  Age: 60 y.o. MRN: [id number]  CC: The primary encounter diagnosis was Movement disorder. Diagnoses of Memory loss, Need for prophylactic vaccination against Streptococcus pneumoniae (pneumococcus), Hypothyroidism due to acquired atrophy of thyroid, Fatty liver disease, nonalcoholic, Adrenal insufficiency (HCC), Unspecified abnormal involuntary movements, Obesity, Class III, BMI 40-49.9 (morbid obesity) (HCC), and Hyperlipidemia were also pertinent to this visit.  HPI [name] presents for follow up on multiple chronic issues.  She was last seen over one year ago,  June 2016. She is accompanied by her  husband.  She appears lethargic.  Her speech is slow and she is having trouble with short term memory .  She reports that she gest easily confused, and becomes tearful when she can't answer my questions because she can't articulate her thoughts. Her husband has noticed involuntary leg movements,  initially occurring  at bedtime,  Now occurring a few times during the day , for the past 6 months.  Coincided with initiation of Latuda,  Her psychiatrist added Ropinirole and benztropine which have helped.   She has not  seen a neurologist yet,  Needs referral to movement disorder specialist.  Currently Dr Archie Patten at [facility] in [place] is working on reducing her dose of Latuda for management of bipolar disorder.  [name]  1) Obesity with impaired fasting glucose.  When last seen her  a1c was 5.6 .  She has gained a few lbs since then and her Body mass index is 42.96 kg/m.    Diet reviewed:  Breakfast is Rice Krispies and OJ anywhere  Between 8 and noon.   Lunch: low carb protein shake made by [employer], [employer]. Avoiding bread,  Chips.  Does not  Snack in between meals. somtimes popcorn    Dinner:  Grilled meats,  Vegetables,  Brown rice or white potatoes    2) nonischemic cardiomyopathy with mild systolic dysfunction (EF 45% mild  global hypokinesis by  Feb 2016 ECHO) that limits her exercise capacity . Has not seen  Dr Gwen Pounds since Feb 2016.          3) Fatty liver:  Needs statin.  Does not drink alcohol.      4 Having recurrent GERD  Reports recurrent episdoses of chest pain occurring at rest lasting several mintues, responds to Tums    Foot exam notable for callouses,  And thickened dystrophic toenails,  No sensory loss   Lab Results  Component Value Date   HGBA1C 6.3 08/17/2015   Lab Results  Component Value Date   CHOL 216 (H) 08/17/2015   HDL 51.90 08/17/2015   LDLCALC 132 (H) 08/17/2015   LDLDIRECT 150.4 01/23/2012   TRIG 162.0 (H) 08/17/2015   CHOLHDL 4 08/17/2015      Outpatient Medications Prior to Visit  Medication Sig Dispense Refill  . albuterol (PROVENTIL HFA;VENTOLIN HFA) 108 (90 BASE) MCG/ACT inhaler Inhale 2 puffs into the lungs every 6 (six) hours as needed. 3.7 g 3  . clonazePAM (KLONOPIN) 1 MG tablet Take 0.5 mg by mouth 3 (three) times daily as needed.     . Diclofenac Sodium 3 % GEL Diclofenac,baclofen, cyclobenzaprine, lidocaine gel to affected area twice daily 100 g 1  . ergocalciferol (DRISDOL) 50000 UNITS capsule Take 1 capsule (50,000 Units total) by mouth once a week. 12 capsule 0  . furosemide (LASIX) 20 MG tablet Take 1 tablet (20 mg total) by mouth 2 (two) times daily. (Patient taking  differently: Take 20 mg by mouth 2 (two) times daily as needed. ) 30 tablet 6  . hydrocortisone (CORTEF) 5 MG tablet TAKE 2 TABLETS BY MOUTH IN THE MORNING AND TAKE 1 TABLET BY MOUTH IN THE EVENING 100 tablet 10  . LATUDA 80 MG TABS tablet Take 80 mg by mouth at bedtime.    [name] levothyroxine (SYNTHROID, LEVOTHROID) 88 MCG tablet Take 1 tablet (88 mcg total) by mouth daily. 90 tablet 0  . ondansetron (ZOFRAN-ODT) 4 MG disintegrating tablet Take 1 tablet (4 mg total) by mouth every 8 (eight) hours as needed for nausea or vomiting. 20 tablet 3  . tizanidine (ZANAFLEX) 6 MG capsule TAKE 1 CAPSULE (6  MG TOTAL) BY MOUTH 3 (THREE) TIMES DAILY AS NEEDED FOR MUSCLE SPASMS. 60 capsule 3  . Vitamin D, Ergocalciferol, (DRISDOL) 50000 UNITS CAPS capsule TAKE 1 CAPSULE (50,000 UNITS TOTAL) BY MOUTH ONCE A WEEK. 4 capsule 2  . escitalopram (LEXAPRO) 20 MG tablet TAKE 1 TABLET BY MOUTH DAILY (Patient not taking: Reported on 08/27/2015) 30 tablet 0  . HYDROcodone-acetaminophen (NORCO) 10-325 MG per tablet Take 1 tablet by mouth every 8 (eight) hours as needed. (Patient not taking: Reported on 08/27/2015) 30 tablet 0  . lubiprostone (AMITIZA) 8 MCG capsule Take 18 mcg by mouth daily.     No facility-administered medications prior to visit.     Review of Systems;  Patient denies headache, fevers, malaise, unintentional weight loss, skin rash, eye pain, sinus congestion and sinus pain, sore throat, dysphagia,  hemoptysis , cough, dyspnea, wheezing, chest pain, palpitations, orthopnea, edema, abdominal pain, nausea, melena, diarrhea, constipation, flank pain, dysuria, hematuria, urinary  Frequency, nocturia, numbness, tingling, seizures,  Focal weakness, Loss of consciousness,  Tremor, insomnia, depression, anxiety, and suicidal ideation.      Objective:  BP 124/84   Pulse (!) 114   Temp 98.5 F (36.9 C) (Oral)   Resp 12   Ht 5\' 4"  (1.626 m)   Wt 250 lb 4 oz (113.5 kg)   SpO2 96%   BMI 42.96 kg/m   BP Readings from Last 3 Encounters:  08/27/15 124/84  07/28/14 (!) 148/86  06/16/14 122/82    Wt Readings from Last 3 Encounters:  08/27/15 250 lb 4 oz (113.5 kg)  07/28/14 249 lb (112.9 kg)  06/16/14 245 lb 8 oz (111.4 kg)    General appearance: alert, cooperative and appears stated age Ears: normal TM's and external ear canals both ears Throat: lips, mucosa, and tongue normal; teeth and gums normal Neck: no adenopathy, no carotid bruit, supple, symmetrical, trachea midline and thyroid not enlarged, symmetric, no tenderness/mass/nodules Back: symmetric, no curvature. ROM normal. No CVA  tenderness. Lungs: clear to auscultation bilaterally Heart: regular rate and rhythm, S1, S2 normal, no murmur, click, rub or gallop Abdomen: soft, non-tender; bowel sounds normal; no masses,  no organomegaly Pulses: 2+ and symmetric Skin: Skin color, texture, turgor normal. No rashes or lesions Lymph nodes: Cervical, supraclavicular, and axillary nodes normal.  Lab Results  Component Value Date   HGBA1C 6.3 08/17/2015   HGBA1C 5.6 06/16/2014   HGBA1C 5.8 01/23/2012    Lab Results  Component Value Date   CREATININE 0.98 08/17/2015   CREATININE 0.85 06/16/2014   CREATININE 0.9 12/11/2013    Lab Results  Component Value Date   WBC 10.7 (H) 08/17/2015   HGB 14.6 08/17/2015   HCT 43.5 08/17/2015   PLT 346.0 08/17/2015   GLUCOSE 135 (H) 08/17/2015   CHOL 216 (H) 08/17/2015  TRIG 162.0 (H) 08/17/2015   HDL 51.90 08/17/2015   LDLDIRECT 150.4 01/23/2012   LDLCALC 132 (H) 08/17/2015   ALT 33 08/17/2015   AST 18 08/17/2015   NA 142 08/17/2015   K 4.2 08/17/2015   CL 106 08/17/2015   CREATININE 0.98 08/17/2015   BUN 15 08/17/2015   CO2 27 08/17/2015   TSH 2.29 08/17/2015   HGBA1C 6.3 08/17/2015    Mr Brain W WUWo Contrast  Result Date: 08/29/2013 CLINICAL DATA:  60 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 Low cortisol and ACTH. Initial encounter. B[facility] and creatinine [facility] at [address] Results:  BUN 20 mg/dL,  Creatinine 1.1 mg/dL. EXAM: MRI HEAD WITHOUT AND WITH CONTRAST TECHNIQUE: Multiplanar, multiecho pulse sequences of the brain and surrounding structures were obtained without and with intravenous contrast. CONTRAST:  10mL MULTIHANCE GADOBENATE DIMEGLUMINE 529 MG/ML IV SOLN COMPARISON:  [facility] Brain MRI 11/03/2009 FINDINGS: Cerebral volume is not significantly changed. Major intracranial vascular flow voids are stable, dominant distal left vertebral artery. No restricted diffusion to suggest  acute infarction. No midline shift, mass effect, evidence of mass lesion, ventriculomegaly, extra-axial collection or acute intracranial hemorrhage. Cervicomedullary junction within normal limits. Negative visualized cervical spine. Wallace CullensGray and white matter signal is within normal limits throughout the brain. Excluding the pituitary region, No abnormal enhancement identified. Visible internal auditory structures appear normal. Visualized paranasal sinuses and mastoids are clear. Visualized orbit soft tissues are within normal limits. Visualized bone marrow signal is within normal limits. Visualized scalp soft tissues are within normal limits. Dedicated pituitary imaging. Overall pituitary size and configuration are normal, and appear stable. Normal infundibulum. No suprasellar mass or mass effect. Normal hypothalamus. Normal cavernous sinus. Following contrast, it dynamic and delayed pituitary gland enhancement is within normal limits. No pituitary lesion or mass identified. No abnormal enhancement identified elsewhere. IMPRESSION: 1. Normal MRI appearance of the pituitary. 2. Stable and normal MRI appearance of the brain. Electronically Signed   By: Augusto GambleLee  Hall M.D.   On: 08/29/2013 08:08    Assessment & Plan:   Problem List Items Addressed This Visit    Hypothyroidism due to acquired atrophy of thyroid    Thyroid function is WNL on current dose.  No current changes needed.   Lab Results  Component Value Date   TSH 2.29 08/17/2015         Hyperlipidemia    Statin therpay recommended given concurrent prediabetes and history of fatty liver .  Lab Results  Component Value Date   CHOL 216 (H) 08/17/2015   HDL 51.90 08/17/2015   LDLCALC 132 (H) 08/17/2015   LDLDIRECT 150.4 01/23/2012   TRIG 162.0 (H) 08/17/2015   CHOLHDL 4 08/17/2015         Relevant Medications   atorvastatin (LIPITOR) 20 MG tablet   Fatty liver disease, nonalcoholic    Presumed by ultrasound changes and serologies negative  for autoimmune causes of hepatitis.  Current liver enzymes are normal and all modifiable risk factors including obesity, diabetes and hyperlipidemia have been addressed   Lab Results  Component Value Date   ALT 33 08/17/2015   AST 18 08/17/2015   ALKPHOS 46 08/17/2015   BILITOT 0.4 08/17/2015         Obesity, Class III, BMI 40-49.9 (morbid obesity) (HCC)    With prediabetes suggested by current A1c of 6.3  I have addressed  BMI and recommended amore regimented schedule of eating using  low  glycemic index diet and smaller more frequent meals to increase metabolism.  I have also recommended that patient start exercising with a goal of 30 minutes of aerobic exercise a minimum of 5 days per week. Screening for lipid disorders, thyroid and diabetes to be done today  Lab Results  Component Value Date   CHOL 216 (H) 08/17/2015   HDL 51.90 08/17/2015   LDLCALC 132 (H) 08/17/2015   LDLDIRECT 150.4 01/23/2012   TRIG 162.0 (H) 08/17/2015   CHOLHDL 4 08/17/2015   Lab Results  Component Value Date   HGBA1C 6.3 08/17/2015   .        Adrenal insufficiency (HCC)    Secondary , due to prednisone vs dilaudid use. She remains normotensive on hydrocortisone      Unspecified abnormal involuntary movements    Reported by patient and husband, may be side effect of Latuda; however, given her concurrent vestibular issues,  Cognitive dysfunction. willl refer to Lurena Joiner Tat to rule out Parkinsonism vs other progressive diisorders.        Other Visit Diagnoses    Movement disorder    -  Primary   Relevant Orders   Ambulatory referral to Neurology   Memory loss       Relevant Orders   Ambulatory referral to Neurology   Need for prophylactic vaccination against Streptococcus pneumoniae (pneumococcus)       Relevant Orders   Pneumococcal polysaccharide vaccine 23-valent greater than or equal to 2yo subcutaneous/IM (Completed)    A total of 40 minutes was spent with patient more than half of  which was spent in counseling patient on the above mentioned issues , reviewing and explaining recent labs and imaging studies done, and coordination of care.  I am having Ms. Suthers start on famotidine and atorvastatin. I am also having her maintain her furosemide, Diclofenac Sodium, lubiprostone, clonazePAM, albuterol, ondansetron, escitalopram, Vitamin D (Ergocalciferol), LATUDA, HYDROcodone-acetaminophen, ergocalciferol, hydrocortisone, tizanidine, and levothyroxine.  Meds ordered this encounter  Medications  . famotidine (PEPCID) 20 MG tablet    Sig: Take 1 tablet (20 mg total) by mouth daily. With breakfast    Dispense:  90 tablet    Refill:  3  . atorvastatin (LIPITOR) 20 MG tablet    Sig: Take 1 tablet (20 mg total) by mouth daily.    Dispense:  90 tablet    Refill:  3    There are no discontinued medications.  Follow-up: Return in about 4 weeks (around 09/24/2015) for  AND cpe 45 MINTUES  NEEDED .   Sherlene Shams, MD

## 2015-08-27 NOTE — Progress Notes (Signed)
Pre-visit discussion using our clinic review tool. No additional management support is needed unless otherwise documented below in the visit note.  

## 2015-08-29 DIAGNOSIS — R259 Unspecified abnormal involuntary movements: Secondary | ICD-10-CM | POA: Insufficient documentation

## 2015-08-29 NOTE — Assessment & Plan Note (Signed)
Reported by patient and husband, may be side effect of Latuda; however, given her concurrent vestibular issues,  Cognitive dysfunction. willl refer to [name] to rule out Parkinsonism vs other progressive diisorders.

## 2015-08-29 NOTE — Assessment & Plan Note (Signed)
Secondary , due to prednisone vs dilaudid use. She remains normotensive on hydrocortisone

## 2015-08-29 NOTE — Assessment & Plan Note (Signed)
With prediabetes suggested by current A1c of 6.3  I have addressed  BMI and recommended amore regimented schedule of eating using  low glycemic index diet and smaller more frequent meals to increase metabolism.  I have also recommended that patient start exercising with a goal of 30 minutes of aerobic exercise a minimum of 5 days per week. Screening for lipid disorders, thyroid and diabetes to be done today  Lab Results  Component Value Date   CHOL 216 (H) 08/17/2015   HDL 51.90 08/17/2015   LDLCALC 132 (H) 08/17/2015   LDLDIRECT 150.4 01/23/2012   TRIG 162.0 (H) 08/17/2015   CHOLHDL 4 08/17/2015   Lab Results  Component Value Date   HGBA1C 6.3 08/17/2015   .

## 2015-08-29 NOTE — Assessment & Plan Note (Signed)
Statin therpay recommended given concurrent prediabetes and history of fatty liver .  Lab Results  Component Value Date   CHOL 216 (H) 08/17/2015   HDL 51.90 08/17/2015   LDLCALC 132 (H) 08/17/2015   LDLDIRECT 150.4 01/23/2012   TRIG 162.0 (H) 08/17/2015   CHOLHDL 4 08/17/2015

## 2015-08-29 NOTE — Assessment & Plan Note (Signed)
Presumed by ultrasound changes and serologies negative for autoimmune causes of hepatitis.  Current liver enzymes are normal and all modifiable risk factors including obesity, diabetes and hyperlipidemia have been addressed   Lab Results  Component Value Date   ALT 33 08/17/2015   AST 18 08/17/2015   ALKPHOS 46 08/17/2015   BILITOT 0.4 08/17/2015

## 2015-08-29 NOTE — Assessment & Plan Note (Signed)
Thyroid function is WNL on current dose.  No current changes needed.   Lab Results  Component Value Date   TSH 2.29 08/17/2015

## 2015-09-08 ENCOUNTER — Other Ambulatory Visit: Payer: Self-pay | Admitting: Internal Medicine

## 2015-09-23 ENCOUNTER — Ambulatory Visit (INDEPENDENT_AMBULATORY_CARE_PROVIDER_SITE_OTHER): Payer: 59 | Admitting: Psychology

## 2015-09-23 DIAGNOSIS — F3181 Bipolar II disorder: Secondary | ICD-10-CM

## 2015-09-23 DIAGNOSIS — F603 Borderline personality disorder: Secondary | ICD-10-CM | POA: Diagnosis not present

## 2015-09-23 NOTE — Progress Notes (Addendum)
Miguel RotaLinda Mikhail was seen today in the movement disorders clinic for neurologic consultation at the request of TULLO, TERESA L, MD.  The consultation is for the evaluation of "movement disorder."  This patient is accompanied in the office by her spouse who supplements the history.  I have reviewed prior records made available to me.  The patient is a 60 year old female with complaints of abnormal movements.  These movements are located in the R leg.  "It will start in the R leg but then will become an episode of her whole body moving.  Pts husband states that her R leg will repetitvely move up and down and then left and right throughout the bed.  Standing up will stop the symtpom.  She had one episode where the entire body would shake and "I shook the whole mattress off of the bed."  The "episodes" happen one time a day and each episode will last 5-15 minutes.  She cannot look at the leg and stop it.  No LOC with events.  Nothing that she knows of that will make an event come out.  The patient reports that they seemed to start one year ago, which coincided with the initiation of Latuda for bipolar disorder.  She does see a psychiatrist, and reports that the dose of Latuda has been reduced.  This didn't change the sx's.  Husband states that Dr. Archie PattenMonroe (psychiatrist) tried ropinirole and benztropine.  Ropinirole may help.  Husband also states that when prior psychiatrist retired, no one else would RX her dexedrine and "that was when the wheels fell off."  She also c/o having a really hard time with memory.  Not currently driving.  Has trouble remembering her medications.  Her husband distributes the AM and night dose but "often the middle of the day dosage gets missed."    Neuroimaging has previously been performed.  It is available for my review today.  She had an MRI of the brain in August, 2015.  I had the opportunity to review this.  It was normal.   ALLERGIES:   Allergies  Allergen Reactions  . Ancef  [Cefazolin Sodium] Anaphylaxis  . Penicillins Anaphylaxis  . Clarithromycin Itching  . Eggs Or Egg-Derived Products Itching  . Seroquel [Quetiapine Fumarate] Other (See Comments)    fatigue    CURRENT MEDICATIONS:  Outpatient Encounter Prescriptions as of 09/24/2015  Medication Sig  . albuterol (PROVENTIL HFA;VENTOLIN HFA) 108 (90 BASE) MCG/ACT inhaler Inhale 2 puffs into the lungs every 6 (six) hours as needed.  [name]. atorvastatin (LIPITOR) 20 MG tablet Take 1 tablet (20 mg total) by mouth daily.  . clonazePAM (KLONOPIN) 1 MG tablet Take 0.5 mg by mouth 2 (two) times daily.   . Diclofenac Sodium 3 % GEL Diclofenac,baclofen, cyclobenzaprine, lidocaine gel to affected area twice daily  . famotidine (PEPCID) 20 MG tablet Take 1 tablet (20 mg total) by mouth daily. With breakfast  . furosemide (LASIX) 20 MG tablet Take 1 tablet (20 mg total) by mouth 2 (two) times daily. (Patient taking differently: Take 20 mg by mouth 2 (two) times daily as needed. )  . gabapentin (NEURONTIN) 100 MG capsule Take 100 mg by mouth daily.  . hydrocortisone (CORTEF) 5 MG tablet TAKE 2 TABLETS BY MOUTH IN THE MORNING AND TAKE 1 TABLET BY MOUTH IN THE EVENING  . levothyroxine (SYNTHROID, LEVOTHROID) 88 MCG tablet Take 1 tablet (88 mcg total) by mouth daily.  [name]. lurasidone (LATUDA) 20 MG TABS tablet Take by mouth  daily.  . ondansetron (ZOFRAN-ODT) 4 MG disintegrating tablet Take 1 tablet (4 mg total) by mouth every 8 (eight) hours as needed for nausea or vomiting.  . ropinirole (REQUIP) 5 MG tablet Take 5 mg by mouth at bedtime.  . tizanidine (ZANAFLEX) 6 MG capsule TAKE 1 CAPSULE (6 MG TOTAL) BY MOUTH 3 (THREE) TIMES DAILY AS NEEDED FOR MUSCLE SPASMS.  . Vilazodone HCl (VIIBRYD) 40 MG TABS Take by mouth daily.  . [DISCONTINUED] LATUDA 80 MG TABS tablet Take 80 mg by mouth at bedtime.  . [DISCONTINUED] ergocalciferol (DRISDOL) 50000 UNITS capsule Take 1 capsule (50,000 Units total) by mouth once a week.  . [DISCONTINUED]  escitalopram (LEXAPRO) 20 MG tablet TAKE 1 TABLET BY MOUTH DAILY (Patient not taking: Reported on 08/27/2015)  . [DISCONTINUED] HYDROcodone-acetaminophen (NORCO) 10-325 MG per tablet Take 1 tablet by mouth every 8 (eight) hours as needed. (Patient not taking: Reported on 08/27/2015)  . [DISCONTINUED] hydrocortisone (CORTEF) 5 MG tablet TAKE 2 TABLETS BY MOUTH IN THE MORNING AND TAKE 1 TABLET BY MOUTH IN THE EVENING  . [DISCONTINUED] lubiprostone (AMITIZA) 8 MCG capsule Take 18 mcg by mouth daily.  . [DISCONTINUED] Vitamin D, Ergocalciferol, (DRISDOL) 50000 UNITS CAPS capsule TAKE 1 CAPSULE (50,000 UNITS TOTAL) BY MOUTH ONCE A WEEK.   No facility-administered encounter medications on file as of 09/24/2015.     PAST MEDICAL HISTORY:   Past Medical History:  Diagnosis Date  . Anxiety   . Arthritis   . Atopic dermatitis    scalp  . Depression   . Hyperlipidemia   . Hypertension    pt denies  . Thyroid disease   . Tobacco abuse     PAST SURGICAL HISTORY:   Past Surgical History:  Procedure Laterality Date  . LUMBAR DISC SURGERY    . ROTATOR CUFF REPAIR Bilateral     SOCIAL HISTORY:   Social History   Social History  . Marital status: Married    Spouse name: N/A  . Number of children: N/A  . Years of education: N/A   Occupational History  . Not on file.   Social History Main Topics  . Smoking status: Light Tobacco Smoker    Packs/day: 0.25    Types: Cigarettes  . Smokeless tobacco: Never Used  . Alcohol use Yes     Comment: once a week   . Drug use: No  . Sexual activity: Not on file   Other Topics Concern  . Not on file   Social History Narrative  . No narrative on file    FAMILY HISTORY:   Family Status  Relation Status  . Mother Alive  . Father Deceased    ROS:  A complete 10 system review of systems was obtained and was unremarkable apart from what is mentioned above.  PHYSICAL EXAMINATION:    VITALS:   Vitals:   09/24/15 1335  BP: 120/80  Pulse:  97  SpO2: 95%  Weight: 252 lb (114.3 kg)  Height: 5' 4.5" (1.638 m)    GEN:  The patient appears stated age and is in NAD. HEENT:  Normocephalic, atraumatic.  The mucous membranes are dry. The superficial temporal arteries are without ropiness or tenderness. CV:  RRR Lungs:  CTAB Neck/HEME:  There are no carotid bruits bilaterally.  Neurological examination:  Orientation: The patient is alert and oriented x3. She is very slow to answer questions. Cranial nerves: There is good facial symmetry. Pupils are equal round, but they were 8-74mm bilaterally and while  they did react, the constriction was only to about 7mm. Fundoscopic exam reveals clear margins bilaterally. Extraocular muscles are intact. The visual fields are full to confrontational testing. The speech is fluent and clear. Soft palate rises symmetrically and there is no tongue deviation. Hearing is intact to conversational tone. Sensation: Sensation is intact to light and pinprick throughout (facial, trunk, extremities). Reports decreased pinprick in the R leg, L arm and R face.  Vibration is intact at the bilateral big toe.   Reports decreased vibration in the R forehead compared to the L.  There is no extinction with double simultaneous stimulation. There is no sensory dermatomal level identified. Motor: Strength is 5/5 in the bilateral upper and lower extremities.   Shoulder shrug is equal and symmetric.  There is no pronator drift. Deep tendon reflexes: Deep tendon reflexes are 3/4 at the bilateral biceps, triceps, brachioradialis, patella and achilles. Plantar responses are downgoing bilaterally.  Movement examination: Tone: There is normal tone in the bilateral upper extremities.  The tone in the lower extremities is normal.  Abnormal movements: none Coordination:  There is no decremation with RAM's, with any form of RAMS, including alternating supination and pronation of the forearm, hand opening and closing, finger taps, heel  taps and toe taps. Gait and Station: The patient has no difficulty arising out of a deep-seated chair without the use of the hands. The patient's stride length is slightly decreased.   She has a valgus deformity on the right.     ASSESSMENT/PLAN:  1.  Abnormal movements  -These do not sound like tardive dyskinesia from [facility] and saw no evidence of this.  Tardive dyskinesia would not come in discrete "episodes" that last 5-15 min.  While I think that seizure is in the Ddx, it is low on the list and suspect that conversion d/o is higher on the list given description from her husband and fact that much of this started when her psychiatrist retired and dexedrine was no longer prescribed.  -reports that she saw Dr. Smiley Houseman and had EEG with him a few years ago.  This was in [place] and don't have reason why it was done and pt/family not sure.  -reports that had EEG at age of 60 y/o and states that her "brain waves" are like "someone with epilepsy" and was on dilantin back then.  Told her that we would look at this to r/o seizure, but suspicion is strong for nonepileptic events.  Will do 48 hour EEG since generally has a spell per day.  -some non physiologic aspects on her examination (vibrational splitting) but nothing that fits a known physiologic pattern worrisome for focal lesion.  2  Memory Change  -don't suspect that this is dementia.  Pt very worried about this and will do neuropsych testing  3.  Hyperreflexia  -diffusely hyperreflexic.  Had MRI brain 2 years ago that was normal and I reviewed it.  Has no neck pain, but does have low back pain.  This would not explain diffuse hyperreflexia.  Suspect it is physiologic hyperreflexia given absence of other exam findings.  4.  Pupillary dilation  -pupils very large and wonder if still exposed to stimulant medication  5.  Will see her back if EEG is abnormal.  Dr. Alinda Dooms will review results of neuropsych testing with her and if found to have neurologic  cause of memory loss, happy to see her back in f/u.     Cc:  Sherlene Shams, MD

## 2015-09-24 ENCOUNTER — Ambulatory Visit (INDEPENDENT_AMBULATORY_CARE_PROVIDER_SITE_OTHER): Payer: 59 | Admitting: Neurology

## 2015-09-24 ENCOUNTER — Encounter: Payer: Self-pay | Admitting: Neurology

## 2015-09-24 VITALS — BP 120/80 | HR 97 | Ht 64.5 in | Wt 252.0 lb

## 2015-09-24 DIAGNOSIS — F319 Bipolar disorder, unspecified: Secondary | ICD-10-CM | POA: Diagnosis not present

## 2015-09-24 DIAGNOSIS — IMO0001 Reserved for inherently not codable concepts without codable children: Secondary | ICD-10-CM

## 2015-09-24 DIAGNOSIS — R6889 Other general symptoms and signs: Secondary | ICD-10-CM | POA: Diagnosis not present

## 2015-09-24 DIAGNOSIS — R413 Other amnesia: Secondary | ICD-10-CM | POA: Diagnosis not present

## 2015-09-24 DIAGNOSIS — R292 Abnormal reflex: Secondary | ICD-10-CM

## 2015-09-28 ENCOUNTER — Telehealth: Payer: Self-pay | Admitting: Neurology

## 2015-09-30 ENCOUNTER — Ambulatory Visit: Payer: 59 | Admitting: Psychology

## 2015-09-30 NOTE — Telephone Encounter (Signed)
Made appt with husband and mailed info.

## 2015-10-01 ENCOUNTER — Ambulatory Visit (INDEPENDENT_AMBULATORY_CARE_PROVIDER_SITE_OTHER): Payer: 59 | Admitting: Internal Medicine

## 2015-10-01 ENCOUNTER — Encounter: Payer: Self-pay | Admitting: Internal Medicine

## 2015-10-01 VITALS — BP 115/79 | HR 94 | Ht 63.5 in | Wt 245.0 lb

## 2015-10-01 DIAGNOSIS — E2740 Unspecified adrenocortical insufficiency: Secondary | ICD-10-CM

## 2015-10-01 NOTE — Progress Notes (Addendum)
Patient ID: [name], female   DOB: Dec 10, 1955, 60 y.o.   MRN: [id number]   HPI  [name] is a 60 y.o.-year-old female, returning for f/u for adrenal insufficiency. She is here with her husband who offers part of the history. Last visit 07/2015.  She has restless legs. She is on prn  Norco for RLS.  She started a keto diet >> lost few lbs.  Reviewed and  hx: Pt. has been dx with adrenal insufficiency at a hospitalization at [facility], when she presented with hypotension, R flank pain and diarrhea. An am cortisol was checked and this was 0.7 (of note, she did receive dilaudid during the hospitalization).    She was seen by PCP on 04/02/2013, and she was still c/o fatigue,weight loss (15 lbs) and was found to have a low-normal BP: 100/68, P 81. A cortisol was checked at 9 am: 6.5, and an ACTH was 9. She was started on Prednisone: 5 mg >> feels a little more energertic.  Of note, pt has a h/o back pain and had surgery 2003-2004 >> feels like she never recovered after the surgery. Remembers her BP was very low after the Sx. Her pain is better after the Sx., but still hurts. She was on Norco, but off it for ~ 1 year. She also has Oxycodone on her med list, but she does not like taking it >> itching.  Pt was on oral steroid pain for back pain, but not since 12/2012. She has had 3-4 joint steroid injections up to 3 years ago. She is using Clobetasol solution on her head.  After last visit, we checked a pituitary MRI (08/2014) >> no pituitary tumor >> likely her AI is 2/2 previous steroid use vs narcotic use for pain.   She was started on Prednisone by PCP >> we switched to Hydrocortisone (HC) 10 mg in am and 5 mg in pm at last visit >> she is tolerating it well, still feels fatigued, but better.  Last year, we checked a cortisol and ACTH after stopping steroid for 24h: Component     Latest Ref Rng 07/18/2013  ACTH     7.2 - 63.3 pg/mL 4.1 (L)  Cortisol, Plasma      2.8   We also have a stim  test: normal, which is not unusual for central AI.: Component     Latest Ref Rng 05/30/2013 05/30/2013 05/30/2013        11:34 AM 12:10 PM 12:45 PM  IGF1            41-279  161    TSH     0.35 - 4.50 uIU/mL 0.36    Cortisol, Plasma      8.2 18.9 22.6  C206 ACTH     6 - 50 pg/mL 5 (L)    Free T4     0.60 - 1.60 ng/dL 1.32    Prolactin      3.6    FSH      53.8    LH      36.30     She still c/o: - + fatigue - + weight gain  >> now losing weight on a ketogenic diet - problems concentrating - no constipation, no nausea - no hair loss  I reviewed pt's medications, allergies, PMH, social hx, family hx, and changes were documented in the history of present illness. Otherwise, unchanged from my initial visit note. Stopped Escitalopram. Lowered Latuda dose. Started Gabapentin.  ROS: Constitutional: see HPI Eyes: +  blurry vision, no xerophthalmia ENT:no sore throat, no nodules palpated in throat, no dysphagia/odynophagia Cardiovascular: no CP/SOB/no palpitations/+ leg swelling Respiratory: no cough/SOB Gastrointestinal: no N/V/D/C, + heartburn Musculoskeletal: + muscle aches/no joint aches Skin: no rashes, + rash Neurological: no tremors/numbness/tingling/dizziness, no HA  PE: BP 115/79   Pulse 94   Ht 5' 3.5" (1.613 m)   Wt 245 lb (111.1 kg)   BMI 42.72 kg/m  Wt Readings from Last 3 Encounters:  10/01/15 245 lb (111.1 kg)  09/24/15 252 lb (114.3 kg)  08/27/15 250 lb 4 oz (113.5 kg)   Constitutional: overweight, in NAD, full supraclavicular fat pads Eyes: PERRLA, EOMI, no exophthalmos ENT: moist mucous membranes, no thyromegaly, no cervical lymphadenopathy Cardiovascular: RRR, No MRG Respiratory: CTA B Gastrointestinal: abdomen soft, NT, ND, BS+ Musculoskeletal: no deformities, strength intact in all 4 Skin: moist, warm, no rashes; thin skin; no dark knuckle discoloration Neurological: no tremor with outstretched hands, DTR normal in all 4  ASSESSMENT: 1. 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 Normal MRI appearance of the pituitary. 2. Stable and normal MRI appearance of the brain.  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  - we continued her steroid replacement (switched to hydrocortisone at last visit, since this has a shorter half life, and allows the hypothalamic-pituitary-adrenal axis to recover more easily). We checked her cortisol, ACTH in 2015 >> still abnormal >> we continued the Hydrocortisone.Of note, a cosyntropin stimulation test was normal last year, but this can happen with secondary AI - the other pituitary hormone levels were normal at that time and a pituitary MRI did not show a pituitary organic cause for her AI - continue HC 10 mg in am and 5 mg in pm, best ~3 pm - will recheck her stimulation test at 8 AM after being off hydrocortisone for 24 hours: Patient Instructions  Please come for labs at 8 am. Take the hydrocortisone the morning before the test, but skip the p.m. dose in the day before the test. Also, skip the hydrocortisone the morning of the test, but bring the tablet with you so you can take it right after we draw labs.  Please return in 1 year.  Component     Latest Ref Rng & Units 10/05/2015 10/05/2015 10/05/2015         8:13 AM  8:53 AM  9:23 AM  Cortisol, Plasma     ug/dL 9.0 09.819.2 11.922.0  [id number] ACTH     6 - 50 pg/mL 12     Normal results. Her AI appears resolved. Would suggest to decrease HC to 5 mg 2x a day for 3 weeks, then 5 mg in am for 3 weeks, then 5 mg in am every other day for 3 weeks, then stop. Call us if she starts feeling poorly: N/V/AP/HA/weight loss. We will need a new cortisol level in 2 months (8 am, fasting).  Carlus Pavlovristina Netra Postlethwait, MD PhD [facility]

## 2015-10-01 NOTE — Patient Instructions (Addendum)
Please come for labs at 8 am.   Take the hydrocortisone the morning before the test, but skip the p.m. dose in the day before the test. Also, skip the hydrocortisone the morning of the test, but bring the tablet with you so you can take it right after we draw labs.  - You absolutely need to take this medication every day and not skip doses. - Please double the dose if you have a fever, for the duration of the fever. - If you cannot take anything by mouth (vomiting) or you have severe diarrhea so that you eliminate the hydrocortisone pills in your stool, please make sure that you get the hydrocortisone in the vein instead - go to the nearest emergency department/urgent care or you may go to your PCPs office.  Please return in 1 year.

## 2015-10-05 ENCOUNTER — Ambulatory Visit (INDEPENDENT_AMBULATORY_CARE_PROVIDER_SITE_OTHER): Payer: 59 | Admitting: Neurology

## 2015-10-05 ENCOUNTER — Other Ambulatory Visit (INDEPENDENT_AMBULATORY_CARE_PROVIDER_SITE_OTHER): Payer: 59

## 2015-10-05 DIAGNOSIS — R6889 Other general symptoms and signs: Principal | ICD-10-CM

## 2015-10-05 DIAGNOSIS — E2740 Unspecified adrenocortical insufficiency: Secondary | ICD-10-CM | POA: Diagnosis not present

## 2015-10-05 DIAGNOSIS — R259 Unspecified abnormal involuntary movements: Secondary | ICD-10-CM | POA: Diagnosis not present

## 2015-10-05 DIAGNOSIS — IMO0001 Reserved for inherently not codable concepts without codable children: Secondary | ICD-10-CM

## 2015-10-05 LAB — CORTISOL
CORTISOL PLASMA: 9 ug/dL
CORTISOL PLASMA: 19.2 ug/dL
CORTISOL PLASMA: 22 ug/dL

## 2015-10-06 ENCOUNTER — Ambulatory Visit (INDEPENDENT_AMBULATORY_CARE_PROVIDER_SITE_OTHER): Payer: 59 | Admitting: Psychology

## 2015-10-06 ENCOUNTER — Encounter: Payer: Self-pay | Admitting: Psychology

## 2015-10-06 DIAGNOSIS — F3160 Bipolar disorder, current episode mixed, unspecified: Secondary | ICD-10-CM

## 2015-10-06 DIAGNOSIS — R413 Other amnesia: Secondary | ICD-10-CM

## 2015-10-06 NOTE — Progress Notes (Signed)
NEUROPSYCHOLOGICAL INTERVIEW (CPT: [id number])  Name: [name] Date of Birth: 08/11/1955 Date of Interview: 10/06/2015  Reason for Referral:  [name] is a 60 y.o. female who is referred for neuropsychological evaluation by Dr. Lurena Joinerebecca Tat of [facility] due to concerns about cognitive dysfunction. This patient is accompanied in the office by her husband who supplements the history.  History of Presenting Problem:  [name] reported memory loss and difficulty articulating thoughts. She reported that in the middle of speaking, she forgets what she wanted to say, and this prevents her from having real conversations. She reported that she repeats questions because she cannot get the answers to "stick". She also reported that she has "a lot of visual confusion" but had difficulty describing what she meant by this.   Her husband reported that he first noticed cognitive difficulty 9-10 months ago. He also reported that she has had some day-night confusion. For example, he will come home from work in the evening and she will be talking about him going off to work as if it was morning. He thinks this has been an issue for at least six months.   The patient actually feels that her cognitive functioning and speech have been better the last 1-2 weeks, though. She thinks this could be related to improved physical balance. She noted that she has a history of falls and concussions but she has not had any falls in the past two months. (She has no history of LOC associated with any of her falls.) Her husband agrees that her cognitive functioning may have improved in the past week since weaning off "ventrazapine" (I am not sure what medication he is referring to).   Upon direct questioning, the patient and her husband reported the following:   Forgetting recent conversations/events: Yes Repeating statements/questions: Yes Misplacing/losing items: Yes Forgetting appointments or other obligations:  Yes Forgetting to take medications: Husband gives morning meds and sometimes she forgets afternoon meds  Difficulty concentrating: Long term issue. A long time friend recalls the patient getting in trouble in school because instead of paying attention to the teacher she would be reading a book in class. Starting but not finishing tasks: Yes, long term issue Distracted easily: Yes, long term issue Processing information more slowly: No  Word-finding difficulty: Yes Word substitutions: No Writing difficulty: No Spelling difficulty: No Comprehension difficulty: Mild The patient reported, "A word salad thing was happening for a while." This was described as a "stream of words that don't make any sense". This happened as recently as a week ago, per her husband.   The patient saw Dr. Arbutus Leasat for neurologic consultation on 09/24/2015 for abnormal leg movements. Per Dr. Arbutus Leasat, "These do not sound [facility] and saw no evidence of this... While I think that seizure is in the Ddx, it is low on the list and suspect that conversion d/o is higher on the list given description from her husband and fact that much of this started when her psychiatrist retired and dexedrine was no longer prescribed."  The patient reported ongoing spells of abnormal movements in her legs. She stated that last night, her left leg "was hanging out of bed doing something bizarre". She stated that she cannot stop the movement from happening and has to wait for it to stop on its own. Her husband reported that yesterday afternoon, she had involuntary leg movements "for a long time".   An MRI of the brain completed on 08/29/2013 was normal. Ambulatory  EEG results are pending.  Of note, with regard to developmental history, the patient reported that her mother was "given speed" when she was pregnant with her, and "didn't gain any weight when she was pregnant". The patient weighed less than 5 lbs when she was born but  there were no major health issues that she knows of. She denied any developmental delays or academic difficulties.    Current Functioning: [name] lives with her husband and adult son in their own home. She is not employed. She does not drive (stopped driving three years ago, unclear why). Her husband assists with all complex ADLs, including medications, finances, appointments and meals. She is starting to get back to cooking.  She reported recent sleep difficulties characterized by awakening in the middle of the night and inability to return to sleep.   Her husband reports her appetite has been "off" lately. She is trying to lose weight.  Physically, she reports that she does not feel good but does not know how to explain it.    Psychiatric History: [name] reported that she first started seeing a psychiatrist around 2000. She was treated for depression and ADD over the years, but she seems unclear on whether she ever actually was diagnosed with ADD. She has seen Dr. Archie Patten, Psychiatrist, in [place], for about a year. She was diagnosed with bipolar disorder by Dr. Archie Patten. She had never been diagnosed with this in the past but she was being treated for depression by previous psychiatrists. She feels that she has had mania in the past, characterized by being very happy and engaging in somewhat odd behaviors (e.g., "sticking nails through chicken breasts" on one occasion which she although thought was part of her "artistic weirdness").   The patient's husband reported that depression is "consistently an undercurrent" for the patient. She had more severe depression in recent years when she stayed in her bedroom 24/7. She endorsed passive suicidal ideation (e.g., wishing she was dead), but no recent suicidal intention, stating "It would be horrible for my family." She has a remote history of suicide attempt in college by overdose. A friend found her and took her to the hospital where her stomach  was pumped. She recalls being angry that she survived.  The patient describes her current mood as "up and down". She reported that just this morning, she was "raging, almost manic" which was atypical for her. She reported that she was upset about not being able to find plates in the kitchen.  [name] also endorses visual hallucinations which she reports have been happening "for a while". She sees cats or men in her home but knows they are not there. When she turns to look at them, they go away. They have never spoken to her. She is not frightened by them. She most recently experienced a hallucination this morning (saw a man sitting in her kitchen out of the corner of her eye).  The patient denies history of substance dependence. She has used marijuana on and off over the years since college.    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  Marital history: Married x36 years with two children (son and daughter).  Alcohol/Tobacco/Substances: Haven't had a drink in at least 2  weeks. Previously only drank socially. Never been a heavy drinker. Daily smoker - about 4 cigarettes a day. Some recent marijuana use.   Medical History: Past Medical History:  Diagnosis Date  . Anxiety   . Arthritis   . Atopic dermatitis    scalp  . Depression   . Hyperlipidemia   . Hypertension    pt denies  . Thyroid disease   . Tobacco abuse     Current Medications:  Outpatient Encounter Prescriptions as of 10/06/2015  Medication Sig  . albuterol (PROVENTIL HFA;VENTOLIN HFA) 108 (90 BASE) MCG/ACT inhaler Inhale 2 puffs into the lungs every 6 (six) hours as needed.  [name] atorvastatin (LIPITOR) 20 MG tablet Take 1 tablet (20 mg total)  by mouth daily.  . clonazePAM (KLONOPIN) 1 MG tablet Take 0.5 mg by mouth 2 (two) times daily.   . Diclofenac Sodium 3 % GEL Diclofenac,baclofen, cyclobenzaprine, lidocaine gel to affected area twice daily  . famotidine (PEPCID) 20 MG tablet Take 1 tablet (20 mg total) by mouth daily. With breakfast  . furosemide (LASIX) 20 MG tablet Take 1 tablet (20 mg total) by mouth 2 (two) times daily. (Patient taking differently: Take 20 mg by mouth 2 (two) times daily as needed. )  . gabapentin (NEURONTIN) 100 MG capsule Take 100 mg by mouth 2 (two) times daily.   . hydrocortisone (CORTEF) 5 MG tablet TAKE 2 TABLETS BY MOUTH IN THE MORNING AND TAKE 1 TABLET BY MOUTH IN THE EVENING  . levothyroxine (SYNTHROID, LEVOTHROID) 88 MCG tablet Take 1 tablet (88 mcg total) by mouth daily.  [name] lurasidone (LATUDA) 20 MG TABS tablet Take by mouth daily.  . ondansetron (ZOFRAN-ODT) 4 MG disintegrating tablet Take 1 tablet (4 mg total) by mouth every 8 (eight) hours as needed for nausea or vomiting.  . ropinirole (REQUIP) 5 MG tablet Take 4 mg by mouth at bedtime.   . tizanidine (ZANAFLEX) 6 MG capsule TAKE 1 CAPSULE (6 MG TOTAL) BY MOUTH 3 (THREE) TIMES DAILY AS NEEDED FOR MUSCLE SPASMS.  . Vilazodone HCl (VIIBRYD) 40 MG TABS Take by mouth daily.   No facility-administered encounter medications on file as of 10/06/2015.    Also takes Benadryl for allergies   Behavioral Observations:   Appearance: Patient wearing ambulatory EEG, casually dressed, appropriately groomed, smells of cigarette smoke. Gait: Ambulated independently, unsteadiness upon standing. No gross gait abnormalities observed. Speech: Fluent; some word finding difficulty Thought process: Distracted at times, non-linear at times Affect: Constricted Interpersonal: No inappropriate behaviors   TESTING: There is medical necessity to proceed with neuropsychological assessment as the results will be used to aid in differential diagnosis and clinical  decision-making and to inform specific treatment recommendations. Per the patient, an informant and medical records reviewed, there has been a change in cognitive functioning, and there is a need for objective testing of subjective cognitive complaints in order to determine neurologic versus psychiatric etiology of symptoms.   PLAN: The patient will return for a full battery of neuropsychological testing with a psychometrician under my supervision. Education regarding testing procedures was provided. Subsequently, the patient will see this provider for a follow-up session at which time her test performances and my impressions and treatment recommendations will be reviewed in detail.   Full neuropsychological evaluation report to follow.

## 2015-10-07 ENCOUNTER — Ambulatory Visit (INDEPENDENT_AMBULATORY_CARE_PROVIDER_SITE_OTHER): Payer: 59 | Admitting: Psychology

## 2015-10-07 DIAGNOSIS — F603 Borderline personality disorder: Secondary | ICD-10-CM | POA: Diagnosis not present

## 2015-10-07 DIAGNOSIS — F3181 Bipolar II disorder: Secondary | ICD-10-CM | POA: Diagnosis not present

## 2015-10-07 LAB — ACTH: C206 ACTH: 12 pg/mL (ref 6–50)

## 2015-10-09 ENCOUNTER — Ambulatory Visit (INDEPENDENT_AMBULATORY_CARE_PROVIDER_SITE_OTHER): Payer: 59 | Admitting: Internal Medicine

## 2015-10-09 ENCOUNTER — Telehealth: Payer: Self-pay

## 2015-10-09 ENCOUNTER — Encounter: Payer: Self-pay | Admitting: Internal Medicine

## 2015-10-09 VITALS — BP 112/72 | HR 118 | Temp 98.8°F | Ht 64.0 in | Wt 248.4 lb

## 2015-10-09 DIAGNOSIS — E2740 Unspecified adrenocortical insufficiency: Secondary | ICD-10-CM

## 2015-10-09 DIAGNOSIS — Z0000 Encounter for general adult medical examination without abnormal findings: Secondary | ICD-10-CM | POA: Diagnosis not present

## 2015-10-09 DIAGNOSIS — E119 Type 2 diabetes mellitus without complications: Secondary | ICD-10-CM | POA: Diagnosis not present

## 2015-10-09 DIAGNOSIS — R259 Unspecified abnormal involuntary movements: Secondary | ICD-10-CM | POA: Diagnosis not present

## 2015-10-09 DIAGNOSIS — I10 Essential (primary) hypertension: Secondary | ICD-10-CM

## 2015-10-09 DIAGNOSIS — Z1239 Encounter for other screening for malignant neoplasm of breast: Secondary | ICD-10-CM

## 2015-10-09 DIAGNOSIS — E1159 Type 2 diabetes mellitus with other circulatory complications: Secondary | ICD-10-CM

## 2015-10-09 DIAGNOSIS — Z1231 Encounter for screening mammogram for malignant neoplasm of breast: Secondary | ICD-10-CM | POA: Diagnosis not present

## 2015-10-09 DIAGNOSIS — E1169 Type 2 diabetes mellitus with other specified complication: Secondary | ICD-10-CM

## 2015-10-09 DIAGNOSIS — E669 Obesity, unspecified: Secondary | ICD-10-CM

## 2015-10-09 MED ORDER — ERGOCALCIFEROL 1.25 MG (50000 UT) PO CAPS: 50000.0000 [IU] | ORAL_CAPSULE | ORAL | 0 refills | Status: DC

## 2015-10-09 NOTE — Progress Notes (Signed)
Pre visit review using our clinic review tool, if applicable. No additional management support is needed unless otherwise documented below in the visit note. 

## 2015-10-09 NOTE — Telephone Encounter (Signed)
Called medication changes and to call if any side effects take place. Patient had no questions at this time, and will call back next week to make 2 month appointment for labs.

## 2015-10-09 NOTE — Addendum Note (Signed)
Addended by: Carlus PavlovGHERGHE, Taitum Alms on: 10/09/2015 12:18 PM   Modules accepted: Orders

## 2015-10-09 NOTE — Progress Notes (Signed)
Patient ID: [name], female    DOB: 09-24-1955  Age: 60 y.o. MRN: [id number]  The patient is here for annual non gyn  examination and management of other chronic and acute problems.   Last PAP was 2015,  Done by GYN  Needs mammogram and flu shot   Recent labs done in August reviewed.  Type 2 DM new diagnosis with glu 137 and a1c 6.4 Did not tolerate low carb diet due to recurrent hypoglycemia. Not exercising.  Fit Bit registering 2700 steps per day,  Interested in trying Yoga.    The risk factors are reflected in the social history.  The roster of all physicians providing medical care to patient - is listed in the Snapshot section of the chart.  Home safety : The patient has smoke detectors in the home. They wear seatbelts.  There are no firearms at home. There is no violence in the home.   There is no risks for hepatitis, STDs or HIV. There is no   history of blood transfusion. They have no travel history to infectious disease endemic areas of the world.  The patient has seen their dentist in the last six month. They have seen their eye doctor in the last year. They admit to slight hearing difficulty with regard to whispered voices and some television programs.  They have deferred audiologic testing in the last year.  They do not  have excessive sun exposure. Discussed the need for sun protection: hats, long sleeves and use of sunscreen if there is significant sun exposure.   Diet: the importance of a healthy diet is discussed. They do have a healthy diet She had recurrent hypoglycemia on the low carb diet, into the 50's   Occurred once daily   The benefits of regular aerobic exercise were discussed. She is not exercising.   Depression screen: there are no signs or vegative symptoms of depression- irritability, change in appetite, anhedonia, sadness/tearfullness.  Cognitive assessment: the patient manages all their financial and personal affairs and is actively engaged. They could  relate day,date,year and events; recalled 2/3 objects at 3 minutes; performed clock-face test normally.  The following portions of the patient's history were reviewed and updated as appropriate: allergies, current medications, past family history, past medical history,  past surgical history, past social history  and problem list.  Visual acuity was not assessed per patient preference since she has regular follow up with her ophthalmologist. Hearing and body mass index were assessed and reviewed.   During the course of the visit the patient was educated and counseled about appropriate screening and preventive services including : fall prevention , diabetes screening, nutrition counseling, colorectal cancer screening, and recommended immunizations.    CC: The primary encounter diagnosis was Breast cancer screening. Diagnoses of Obesity, diabetes, and hypertension syndrome (HCC), Diabetes mellitus without complication (HCC), Encounter for preventive health examination, Unspecified abnormal involuntary movements, Adrenal insufficiency (HCC), and Obesity, Class III, BMI 40-49.9 (morbid obesity) (HCC) were also pertinent to this visit.   Cognitive changes, involuntary leg movement. Referred to Neurology  Saw Dr Tat on Set 23.  EEG and neuropsych testing ordered  Smoking 4 cigs day,  Wants to quit  Wants to lose weight,  Getting hypoglycemic on low carb diet  Pre diabetes.    Had EEG done earlier in the week   History [name] has a past medical history of Anxiety; Arthritis; Atopic dermatitis; Depression; Hyperlipidemia; Hypertension; Thyroid disease; and Tobacco abuse.   She has a past surgical  history that includes Rotator cuff repair (Bilateral) and Lumbar disc surgery.   Her family history includes Arthritis in her mother; Heart disease in her father; Macular degeneration in her mother.She reports that she has been smoking Cigarettes.  She has been smoking about 0.25 packs per day. She has never  used smokeless tobacco. She reports that she drinks alcohol. She reports that she does not use drugs.  Outpatient Medications Prior to Visit  Medication Sig Dispense Refill  . albuterol (PROVENTIL HFA;VENTOLIN HFA) 108 (90 BASE) MCG/ACT inhaler Inhale 2 puffs into the lungs every 6 (six) hours as needed. 3.7 g 3  . atorvastatin (LIPITOR) 20 MG tablet Take 1 tablet (20 mg total) by mouth daily. 90 tablet 3  . clonazePAM (KLONOPIN) 1 MG tablet Take 0.5 mg by mouth 2 (two) times daily.     . Diclofenac Sodium 3 % GEL Diclofenac,baclofen, cyclobenzaprine, lidocaine gel to affected area twice daily 100 g 1  . famotidine (PEPCID) 20 MG tablet Take 1 tablet (20 mg total) by mouth daily. With breakfast 90 tablet 3  . furosemide (LASIX) 20 MG tablet Take 1 tablet (20 mg total) by mouth 2 (two) times daily. (Patient taking differently: Take 20 mg by mouth 2 (two) times daily as needed. ) 30 tablet 6  . gabapentin (NEURONTIN) 100 MG capsule Take 100 mg by mouth 2 (two) times daily.     . hydrocortisone (CORTEF) 5 MG tablet TAKE 2 TABLETS BY MOUTH IN THE MORNING AND TAKE 1 TABLET BY MOUTH IN THE EVENING 100 tablet 10  . levothyroxine (SYNTHROID, LEVOTHROID) 88 MCG tablet Take 1 tablet (88 mcg total) by mouth daily. 90 tablet 0  . lurasidone (LATUDA) 20 MG TABS tablet Take by mouth daily.    . ondansetron (ZOFRAN-ODT) 4 MG disintegrating tablet Take 1 tablet (4 mg total) by mouth every 8 (eight) hours as needed for nausea or vomiting. 20 tablet 3  . ropinirole (REQUIP) 5 MG tablet Take 4 mg by mouth at bedtime.     . tizanidine (ZANAFLEX) 6 MG capsule TAKE 1 CAPSULE (6 MG TOTAL) BY MOUTH 3 (THREE) TIMES DAILY AS NEEDED FOR MUSCLE SPASMS. 60 capsule 3  . Vilazodone HCl (VIIBRYD) 40 MG TABS Take by mouth daily.     No facility-administered medications prior to visit.     Review of Systems   Patient denies headache, fevers, malaise, unintentional weight loss, skin rash, eye pain, sinus congestion and sinus  pain, sore throat, dysphagia,  hemoptysis , cough, dyspnea, wheezing, chest pain, palpitations, orthopnea, edema, abdominal pain, nausea, melena, diarrhea, constipation, flank pain, dysuria, hematuria, urinary  Frequency, nocturia, numbness, tingling, seizures,  Focal weakness, Loss of consciousness,  Tremor, insomnia, depression, anxiety, and suicidal ideation.      Objective:  BP 112/72   Pulse (!) 118   Temp 98.8 F (37.1 C) (Oral)   Ht 5\' 4"  (1.626 m)   Wt 248 lb 6.4 oz (112.7 kg)   SpO2 98%   BMI 42.64 kg/m   Physical Exam   General appearance: alert, cooperative and appears stated age Head: Normocephalic, without obvious abnormality, atraumatic Eyes: conjunctivae/corneas clear. PERRL, EOM's intact. Fundi benign. Ears: normal TM's and external ear canals both ears Nose: Nares normal. Septum midline. Mucosa normal. No drainage or sinus tenderness. Throat: lips, mucosa, and tongue normal; teeth and gums normal Neck: no adenopathy, no carotid bruit, no JVD, supple, symmetrical, trachea midline and thyroid not enlarged, symmetric, no tenderness/mass/nodules Lungs: clear to auscultation bilaterally Breasts: normal  appearance, no masses or tenderness Heart: regular rate and rhythm, S1, S2 normal, no murmur, click, rub or gallop Abdomen: soft, non-tender; bowel sounds normal; no masses,  no organomegaly Extremities: extremities normal, atraumatic, no cyanosis or edema Pulses: 2+ and symmetric Skin: Skin color, texture, turgor normal. No rashes or lesions Neurologic: Alert and oriented X 3, normal strength and tone. Normal symmetric reflexes. Normal coordination and gait.     Assessment & Plan:   Problem List Items Addressed This Visit    Obesity, Class III, BMI 40-49.9 (morbid obesity) (HCC)    aggravated by psychiatric disorders and side effects of medications. I have addressed  BMI and recommended wt loss of 10% of body weight over the next 6 months using a low fat diet since  she did not tolerate a low glycemic index diet  and regular exercise a minimum of 5 days per week.        Adrenal insufficiency (HCC)    She is being gradually weaned off of prednisone by Dr Lafe GarinGherge.       Encounter for preventive health examination    Annual comprehensive preventive exam was done as well as an evaluation and management of chronic conditions .  During the course of the visit the patient was educated and counseled about appropriate screening and preventive services including :  diabetes screening, lipid analysis with projected  10 year  risk for CAD , nutrition counseling, breast, cervical and colorectal cancer screening, and recommended immunizations.  Printed recommendations for health maintenance screenings was given      Unspecified abnormal involuntary movements    Workup is in progress by Neurology,  No evidence of tardive dyskinesia per last evaluation .      Obesity, diabetes, and hypertension syndrome (HCC)    New diagnosis, with Ac of 6.3 and fasting glucose of 134.  Sh e had recurrent hypoglycmeina in a very low carb diet.  alternatve  Low fat diet recommended.  Lab Results  Component Value Date   HGBA1C 6.3 08/17/2015   Lab Results  Component Value Date   MICROALBUR 0.3 10/09/2015         Other Visit Diagnoses    Breast cancer screening    -  Primary   Relevant Orders   MM DIGITAL SCREENING BILATERAL   Diabetes mellitus without complication (HCC)       Relevant Orders   Urine Microalbumin w/creat. ratio (Completed)      I am having [name] start on ergocalciferol. I am also having her maintain her furosemide, Diclofenac Sodium, clonazePAM, albuterol, ondansetron, hydrocortisone, tizanidine, levothyroxine, famotidine, atorvastatin, Vilazodone HCl, gabapentin, lurasidone, ropinirole, rOPINIRole, and benztropine.  Meds ordered this encounter  Medications  . rOPINIRole (REQUIP) 4 MG tablet  . benztropine (COGENTIN) 2 MG tablet  . ergocalciferol  (VITAMIN D2) 50000 units capsule    Sig: Take 1 capsule (50,000 Units total) by mouth once a week.    Dispense:  12 capsule    Refill:  0    There are no discontinued medications.  Follow-up: Return in about 6 months (around 04/08/2016) for follow up diabetes.   Sherlene ShamsULLO, Janaisha Tolsma L, MD

## 2015-10-09 NOTE — Patient Instructions (Addendum)
1) I'm glad you want to lose weight,   The low carb diet cuased your blood sugars to drop, so it's not the right diet for you.  The  diet I discussed with you today is the 10 day Green Smoothie Cleansing /Detox Diet by Brooke DareJJ Smith . available on [employer] for around $10.  It does require a blender, (Vita Mix,  Or a Nutribullet Rx).  This is not a low carb or a weight loss diet,  It is fundamentally a "cleansing" low fat diet that eliminates processed/refined sugar, gluten, caffeine, alcohol and dairy for 10 days .  What you add back after the initial ten days is entirely up to  you!  You can expect to lose 5 to 10 lbs depending on how strict you are.  You can continue the diet with her "30 day plan" which has some wonderful Mediterranean diet style recipes for longterm use.   If you try this diet ,  Try  drinking 2 smoothies or juices  daily and keeping one chewable meal (but keep it simple, like baked/grilled  fish and salad, rice and  A green vegetable including bok choy) .  This kept me satisfied and kept me from straying  .  You snack primarily on fresh  fruit, egg whites and judicious quantities of nuts.  You can add a  vegetable based protein powder  to any smoothie made with almond milk (nothing with whey , since whey is dairy)  [employer] has a few but  the [employer] has the greatest  selection .  Using frozen fruits is much more convenient and cost effective. You can even find plenty of organic fruit in the frozen fruit section of [employer].  Just thaw what you need for the following day the night before in the refrigerator (to avoid jamming up your machine)   The organic vegan protein powder I tried  is called Vega" and I found it at [employer] .  It is sugar free. Tastes like crap.  My advice:  Dorna BloomChew your protein  (eat an egg or two in the am with your smoothie or add soy yogurt for protein ) ,  Don't ruin the taste of your smoothies with protein powder unless you can find one you really love.    2)  You have diabetes controlled diabetes.   Make sure you have a "diabetic eye exam this year.    3) Your vitamin D is low again.   I am calling in a megadose of Vit D to take once weekly for a total of 3 months  After you finish the weekly Vitamin D supplement, you should start taking an OTC  Vit D3 supplement 1000 units daily, November through April. .   We will repeat your A1c and fasting lipids in February , before your next visit.

## 2015-10-10 DIAGNOSIS — E1159 Type 2 diabetes mellitus with other circulatory complications: Secondary | ICD-10-CM

## 2015-10-10 DIAGNOSIS — E1169 Type 2 diabetes mellitus with other specified complication: Secondary | ICD-10-CM | POA: Insufficient documentation

## 2015-10-10 DIAGNOSIS — I10 Essential (primary) hypertension: Secondary | ICD-10-CM

## 2015-10-10 DIAGNOSIS — E669 Obesity, unspecified: Secondary | ICD-10-CM | POA: Insufficient documentation

## 2015-10-10 LAB — MICROALBUMIN / CREATININE URINE RATIO
Creatinine, Urine: 56 mg/dL (ref 20–320)
MICROALB UR: 0.3 mg/dL
MICROALB/CREAT RATIO: 5 ug/mg{creat} (ref <30)

## 2015-10-10 NOTE — Assessment & Plan Note (Addendum)
New diagnosis, with Ac of 6.3 and fasting glucose of 134.  Sh e had recurrent hypoglycmeina in a very low carb diet.  alternatve  Low fat diet recommended.  Lab Results  Component Value Date   HGBA1C 6.3 08/17/2015   Lab Results  Component Value Date   MICROALBUR 0.3 10/09/2015

## 2015-10-10 NOTE — Assessment & Plan Note (Signed)
Workup is in progress by Neurology,  No evidence of tardive dyskinesia per last evaluation .

## 2015-10-10 NOTE — Assessment & Plan Note (Signed)
aggravated by psychiatric disorders and side effects of medications. I have addressed  BMI and recommended wt loss of 10% of body weight over the next 6 months using a low fat diet since she did not tolerate a low glycemic index diet  and regular exercise a minimum of 5 days per week.

## 2015-10-10 NOTE — Assessment & Plan Note (Signed)
Annual comprehensive preventive exam was done as well as an evaluation and management of chronic conditions .  During the course of the visit the patient was educated and counseled about appropriate screening and preventive services including :  diabetes screening, lipid analysis with projected  10 year  risk for CAD , nutrition counseling, breast, cervical and colorectal cancer screening, and recommended immunizations.  Printed recommendations for health maintenance screenings was given 

## 2015-10-10 NOTE — Assessment & Plan Note (Signed)
She is being gradually weaned off of prednisone by Dr Lafe GarinGherge.

## 2015-10-12 ENCOUNTER — Telehealth: Payer: Self-pay | Admitting: Internal Medicine

## 2015-10-12 NOTE — Telephone Encounter (Signed)
Pt dropped off a [facility] form to be completed by Dr. Darrick Huntsmanullo. Paper is in Dr. Melina Schoolsullo's color folder up front.

## 2015-10-12 NOTE — Telephone Encounter (Signed)
Yes, but what strength has she bought?  She reported using too high a dose in the past and it made her sick.  Should be using the lowests dose 7 mcg if smoking < 1/2 pack daily

## 2015-10-12 NOTE — Telephone Encounter (Signed)
Pt called and stated that she is trying to quit smoking and has bought some patches Nicoderm and on the back of the box it says to speak with PCP before starting.  Please advise, thank you!  Call pt @ [phone]

## 2015-10-12 NOTE — Telephone Encounter (Signed)
OK for patient to use Nicoderm patch?

## 2015-10-13 ENCOUNTER — Encounter: Payer: Self-pay | Admitting: Internal Medicine

## 2015-10-13 ENCOUNTER — Telehealth: Payer: Self-pay | Admitting: *Deleted

## 2015-10-13 NOTE — Telephone Encounter (Signed)
Pt has requested a call to discuss her flu like symptoms, pt has had the flu shot. She denied scheduling an appt to be evaluated, he just wanted her questions answered  Pt contact [phone]

## 2015-10-13 NOTE — Telephone Encounter (Signed)
Patient notified and voiced understanding.

## 2015-10-13 NOTE — Telephone Encounter (Signed)
Left message to call office

## 2015-10-13 NOTE — Telephone Encounter (Signed)
Returned call to patient patient denied symptoms.

## 2015-10-14 ENCOUNTER — Ambulatory Visit (INDEPENDENT_AMBULATORY_CARE_PROVIDER_SITE_OTHER): Payer: 59 | Admitting: Psychology

## 2015-10-14 DIAGNOSIS — F3181 Bipolar II disorder: Secondary | ICD-10-CM | POA: Diagnosis not present

## 2015-10-14 DIAGNOSIS — F603 Borderline personality disorder: Secondary | ICD-10-CM | POA: Diagnosis not present

## 2015-10-15 NOTE — Telephone Encounter (Signed)
In red folder. 

## 2015-10-16 NOTE — Telephone Encounter (Signed)
Form has been completed by MD except for administrative info .  The  Charge is $20. Please coomplete the form  and fax per instructions on cover sheet .  In blue folder.

## 2015-10-16 NOTE — Telephone Encounter (Signed)
Completed and faxed. thanks

## 2015-10-20 ENCOUNTER — Telehealth: Payer: Self-pay | Admitting: Neurology

## 2015-10-20 NOTE — Telephone Encounter (Signed)
EEG reviewed.  Pt described multiple various episodes of visual hallucinations, rage episodes, shaking episodes, etc during the EEG and none of the these were accompanied by changes in the background activity of the EEG and are considered nonepileptic in nature.  [name], please let pt know that no evidence of any seizure or seizure waves with any of these episodes and are non neurologic in nature.  She does not need to continue to follow with me but does need to continue to follow with psychiatry and PCP.

## 2015-10-20 NOTE — Procedures (Signed)
ELECTROENCEPHALOGRAM REPORT  Dates of Recording: 10/05/2015 to 10/07/2015  Patient's Name: [name] MRN: [id number] Date of Birth: [birth year]  Referring Provider: Dr. Lurena Joiner Tat  Procedure: 48-hour ambulatory EEG  History: This is a 60 year old woman with abnormal movements lasting 5-15 minutes  Medications: Proventil, Ventolin, Lipitor, clonazepam, Pepcid, Neurontin, Lasix, Neurontin, Cortef, Synthroid, Latuda, Requip, Zanaflex, Viibryd  Engineer, technical sales: This is a 48-hour multichannel digital EEG recording measured by the international 10-20 system with electrodes applied with paste and impedances below 5000 ohms performed as portable with EKG monitoring.  The digital EEG was referentially recorded, reformatted, and digitally filtered in a variety of bipolar and referential montages for optimal display.    DESCRIPTION OF RECORDING: During maximal wakefulness, the background activity consisted of a symmetric 8 Hz posterior dominant rhythm which was reactive to eye opening.  There were wicket spikes seen over the bilateral temporal regions, left greater than right. There were no epileptiform discharges or focal slowing seen in wakefulness.  During the recording, the patient progresses through wakefulness, drowsiness, and Stage 2 sleep. Similar wicket spikes, a normal variant with no pathological significance, were seen over the bilateral temporal regions, left greater than right.  Again, there were no epileptiform discharges seen.  Events: On 10/2 at 1028 hours, she reports left foot twitching side to side for 2 minutes. Electrographically, there were no EEG or EKG changes seen.  On 10/2 at 1405 hours, she reports headache and dizziness. Electrographically, there were no EEG or EKG changes seen.  On 10/2 at 1443 hours, she reports left foot moving, progressing to entire left leg rocking back and forth, stopped at 1454 when she stood up. On the video, irregular side to side left then  right foot movements are seen, asynchronous, at times with flexion/extension at the ankle and at the hip. Electrographically, there were no EEG or EKG changes seen.  On 10/2 at 1634 hours, she reports leg motions started and pain, reports more involvement and crying/sobbing, squirming and crying. On the video, her left leg his dangling off the bed, swinging to the side with flexion/extension at the knee. Electrographically, there were no EEG or EKG changes seen.  On 10/2 at 1850 hours, she reports leg twitching while sitting then legs moving back and forth. Electrographically, there were no EEG or EKG changes seen.  On 10/3 at 0734 hours, she reports visual hallucinations and raging, dizziness. Electrographically, there were no EEG or EKG changes seen.  On 10/3 at 1300 hours, she reports feeling dizzy (eye movements) not seen on video. Electrographically, there were no EEG or EKG changes seen.  On 10/3 at 1420 hours, she reports rocking back and forth, feeling sad. Electrographically, there were no EEG or EKG changes seen.  On 10/3 at 1850 hours, she reports raging/sobbing, depressed, shouting. Electrographically, there were no EEG or EKG changes seen.  On 10/3 at 2320 hours, she reports right leg movement followed by full body rolling and crying out. On the video, she is sitting on the couch and starts having right leg movements at the hip, then upper body and head side to side rocking is seen lasting up to 4 minutes. Electrographically, there were no EEG or EKG changes seen.  On 10/4 at 0725 hours, she reports whining and rocking that lasts 10 minutes followed by halting speech. Electrographically, there were no EEG or EKG changes seen.  There were no electrographic seizures seen.  EKG lead was unremarkable.  IMPRESSION: This 48-hour ambulatory EEG study is  within normal limits. There were several episodes captured with side to side foot and leg movements, upper body rocking, with no  electrographic correlate.  CLINICAL CORRELATION: The episodes as described above did not have any EEG correlate, indicating these are non-epileptic. Baseline EEG was within normal limits, no clear epileptiform discharges were seen.   Patrcia DollyKaren Mclane Arora, M.D.

## 2015-10-20 NOTE — Telephone Encounter (Signed)
Left message on machine for patient to call back.

## 2015-10-20 NOTE — Telephone Encounter (Signed)
Patient made aware.

## 2015-10-21 ENCOUNTER — Ambulatory Visit: Payer: 59 | Admitting: Psychology

## 2015-10-25 ENCOUNTER — Other Ambulatory Visit: Payer: Self-pay | Admitting: Internal Medicine

## 2015-10-26 ENCOUNTER — Telehealth: Payer: Self-pay | Admitting: *Deleted

## 2015-10-26 NOTE — Telephone Encounter (Signed)
Medication filled.  

## 2015-10-26 NOTE — Telephone Encounter (Signed)
Pt's husband has requested a med refill for ventolin [facility] [address]

## 2015-10-28 ENCOUNTER — Ambulatory Visit (INDEPENDENT_AMBULATORY_CARE_PROVIDER_SITE_OTHER): Payer: 59 | Admitting: Psychology

## 2015-10-28 DIAGNOSIS — F603 Borderline personality disorder: Secondary | ICD-10-CM | POA: Diagnosis not present

## 2015-10-28 DIAGNOSIS — F3181 Bipolar II disorder: Secondary | ICD-10-CM

## 2015-10-29 ENCOUNTER — Encounter: Payer: Self-pay | Admitting: Psychology

## 2015-11-04 ENCOUNTER — Ambulatory Visit (INDEPENDENT_AMBULATORY_CARE_PROVIDER_SITE_OTHER): Payer: 59 | Admitting: Psychology

## 2015-11-04 DIAGNOSIS — F3181 Bipolar II disorder: Secondary | ICD-10-CM

## 2015-11-04 DIAGNOSIS — F4481 Dissociative identity disorder: Secondary | ICD-10-CM | POA: Diagnosis not present

## 2015-11-04 DIAGNOSIS — Z7689 Persons encountering health services in other specified circumstances: Secondary | ICD-10-CM

## 2015-11-10 ENCOUNTER — Ambulatory Visit (INDEPENDENT_AMBULATORY_CARE_PROVIDER_SITE_OTHER): Payer: 59 | Admitting: Psychology

## 2015-11-10 DIAGNOSIS — R413 Other amnesia: Secondary | ICD-10-CM

## 2015-11-10 NOTE — Progress Notes (Signed)
   Neuropsychology Note  Miguel RotaLinda Goers returned today for neuropsychological testing with technician, Wallace Kellerana Chamberlain, BS, under the supervision of Dr. Elvis CoilMaryBeth Bailar. The patient presented as very distressed at the beginning of the testing session, noting that she recently received an upsetting diagnosis. However, she was amenable to attempting the evaluation. She completed the first brief measure, but when the second one was presented to her, she stated she was unable to see the test stimuli (although it was of the same type as the previous test she had completed without difficulty). She became distressed again, and despite encouragement and support provided by the psychometrician, she requested that her testing session be rescheduled for December which would allow her to see her ophthamologist and also to emotionally process her new diagnosis. The patient will be seen for the testing on December 17, 2015 at 1:00 p.m. The patient understands she can contact our office should she require our assistance before this time.  Patient was seen today from 12:15 p.m to 12:52 p.m. (1 unit of [id number])  Full testing session and report to follow.

## 2015-11-11 ENCOUNTER — Ambulatory Visit (INDEPENDENT_AMBULATORY_CARE_PROVIDER_SITE_OTHER): Payer: 59 | Admitting: Psychology

## 2015-11-11 DIAGNOSIS — F3181 Bipolar II disorder: Secondary | ICD-10-CM | POA: Diagnosis not present

## 2015-11-11 DIAGNOSIS — F4481 Dissociative identity disorder: Secondary | ICD-10-CM | POA: Diagnosis not present

## 2015-11-16 ENCOUNTER — Ambulatory Visit
Admission: RE | Admit: 2015-11-16 | Discharge: 2015-11-16 | Disposition: A | Discharge status: Home / Self Care | Payer: 59 | Source: Ambulatory Visit | Attending: Internal Medicine | Admitting: Internal Medicine

## 2015-11-16 DIAGNOSIS — Z1231 Encounter for screening mammogram for malignant neoplasm of breast: Secondary | ICD-10-CM | POA: Diagnosis not present

## 2015-11-17 ENCOUNTER — Encounter: Payer: Self-pay | Admitting: Psychology

## 2015-11-18 ENCOUNTER — Ambulatory Visit (INDEPENDENT_AMBULATORY_CARE_PROVIDER_SITE_OTHER): Payer: 59 | Admitting: Psychology

## 2015-11-18 DIAGNOSIS — F3181 Bipolar II disorder: Secondary | ICD-10-CM | POA: Diagnosis not present

## 2015-11-18 DIAGNOSIS — F603 Borderline personality disorder: Secondary | ICD-10-CM

## 2015-11-20 ENCOUNTER — Telehealth: Payer: Self-pay | Admitting: *Deleted

## 2015-11-20 NOTE — Telephone Encounter (Signed)
Pt requested to have a medication refill for tizanidine, to travel  [facility]

## 2015-11-20 NOTE — Telephone Encounter (Signed)
Requesting for travel, please advise for refill, thanks

## 2015-11-22 MED ORDER — TIZANIDINE HCL 6 MG PO CAPS: ORAL_CAPSULE | ORAL | 3 refills | Status: DC

## 2015-11-23 NOTE — Telephone Encounter (Signed)
This was done yesterday.  

## 2015-11-25 ENCOUNTER — Ambulatory Visit: Payer: 59 | Admitting: Psychology

## 2015-12-02 ENCOUNTER — Ambulatory Visit: Payer: 59 | Admitting: Psychology

## 2015-12-02 ENCOUNTER — Telehealth: Payer: Self-pay | Admitting: *Deleted

## 2015-12-03 ENCOUNTER — Encounter: Payer: Self-pay | Admitting: Family

## 2015-12-03 ENCOUNTER — Ambulatory Visit (INDEPENDENT_AMBULATORY_CARE_PROVIDER_SITE_OTHER): Payer: 59 | Admitting: Family

## 2015-12-03 VITALS — BP 122/76 | HR 96 | Temp 98.1°F | Ht 64.0 in | Wt 256.0 lb

## 2015-12-03 DIAGNOSIS — R195 Other fecal abnormalities: Secondary | ICD-10-CM | POA: Diagnosis not present

## 2015-12-03 DIAGNOSIS — R300 Dysuria: Secondary | ICD-10-CM | POA: Diagnosis not present

## 2015-12-03 DIAGNOSIS — T148XXA Other injury of unspecified body region, initial encounter: Secondary | ICD-10-CM | POA: Diagnosis not present

## 2015-12-03 LAB — POCT URINALYSIS DIPSTICK
Bilirubin, UA: NEGATIVE
Blood, UA: NEGATIVE
GLUCOSE UA: NEGATIVE
Ketones, UA: NEGATIVE
Nitrite, UA: NEGATIVE
Protein, UA: NEGATIVE
SPEC GRAV UA: 1.025
UROBILINOGEN UA: 0.2
pH, UA: 5.5

## 2015-12-03 LAB — CBC WITH DIFFERENTIAL/PLATELET
BASOS ABS: 0 10*3/uL (ref 0.0–0.1)
Basophils Relative: 0.6 % (ref 0.0–3.0)
EOS ABS: 0.4 10*3/uL (ref 0.0–0.7)
Eosinophils Relative: 5.4 % — ABNORMAL HIGH (ref 0.0–5.0)
HCT: 39.4 % (ref 36.0–46.0)
HEMOGLOBIN: 13.4 g/dL (ref 12.0–15.0)
Lymphocytes Relative: 28 % (ref 12.0–46.0)
Lymphs Abs: 2.3 10*3/uL (ref 0.7–4.0)
MCHC: 34 g/dL (ref 30.0–36.0)
MCV: 88.4 fl (ref 78.0–100.0)
MONO ABS: 0.5 10*3/uL (ref 0.1–1.0)
Monocytes Relative: 6.1 % (ref 3.0–12.0)
Neutro Abs: 5 10*3/uL (ref 1.4–7.7)
Neutrophils Relative %: 59.9 % (ref 43.0–77.0)
Platelets: 306 10*3/uL (ref 150.0–400.0)
RBC: 4.45 Mil/uL (ref 3.87–5.11)
RDW: 15.3 % (ref 11.5–15.5)
WBC: 8.3 10*3/uL (ref 4.0–10.5)

## 2015-12-03 LAB — COMPREHENSIVE METABOLIC PANEL
ALBUMIN: 4.1 g/dL (ref 3.5–5.2)
ALK PHOS: 49 U/L (ref 39–117)
ALT: 44 U/L — ABNORMAL HIGH (ref 0–35)
AST: 26 U/L (ref 0–37)
BILIRUBIN TOTAL: 0.4 mg/dL (ref 0.2–1.2)
BUN: 25 mg/dL — AB (ref 6–23)
CO2: 27 mEq/L (ref 19–32)
CREATININE: 0.86 mg/dL (ref 0.40–1.20)
Calcium: 9.3 mg/dL (ref 8.4–10.5)
Chloride: 105 mEq/L (ref 96–112)
GFR: 70.89 mL/min (ref >60.00)
Glucose, Bld: 138 mg/dL — ABNORMAL HIGH (ref 70–99)
POTASSIUM: 4.5 meq/L (ref 3.5–5.1)
SODIUM: 141 meq/L (ref 135–145)
TOTAL PROTEIN: 7 g/dL (ref 6.0–8.3)

## 2015-12-03 NOTE — Progress Notes (Signed)
Subjective:    Patient ID: Renee RotaLinda Fusilier, female    DOB: 07/02/1955, 60 y.o.   MRN: [id number]  CC: Renee RotaLinda Nicoson is a 60 y.o. female who presents today for an acute visit.    HPI: Here with multiple complaints.   CC:  Bruising on forearms for past several weeks, unchanged. No nosebloods, blood in stool.  Also worried about elevated liver enzymes and requesting them to be checked due to medications from psychiatry.   Also complains of urinary urgency 6 months. Notes intermittent bladder irritated.' No in urine. Notes urinary urgency. No urinary incontinence, fever, hematuria.  Also worried about intermittent loose to formed stools over past couple of months, unchanged. 2 loose stools per day. No abdominal pain. Unrelated to foods, stress.       HISTORY:  Past Medical History:  Diagnosis Date  . Anxiety   . Arthritis   . Atopic dermatitis    scalp  . Depression   . Hyperlipidemia   . Hypertension    pt denies  . Thyroid disease   . Tobacco abuse    Past Surgical History:  Procedure Laterality Date  . LUMBAR DISC SURGERY    . ROTATOR CUFF REPAIR Bilateral    Family History  Problem Relation Age of Onset  . Arthritis Mother   . Macular degeneration Mother   . Heart disease Father     Allergies: Ancef [cefazolin sodium]; Penicillins; Clarithromycin; Eggs or egg-derived products; and Seroquel [quetiapine fumarate] Current Outpatient Prescriptions on File Prior to Visit  Medication Sig Dispense Refill  . benztropine (COGENTIN) 2 MG tablet     . clonazePAM (KLONOPIN) 1 MG tablet Take 0.5 mg by mouth 2 (two) times daily.     . Diclofenac Sodium 3 % GEL Diclofenac,baclofen, cyclobenzaprine, lidocaine gel to affected area twice daily 100 g 1  . ergocalciferol (VITAMIN D2) 50000 units capsule Take 1 capsule (50,000 Units total) by mouth once a week. 12 capsule 0  . famotidine (PEPCID) 20 MG tablet Take 1 tablet (20 mg total) by mouth daily. With breakfast 90 tablet 3  .  furosemide (LASIX) 20 MG tablet Take 1 tablet (20 mg total) by mouth 2 (two) times daily. (Patient taking differently: Take 20 mg by mouth 2 (two) times daily as needed. ) 30 tablet 6  . gabapentin (NEURONTIN) 100 MG capsule Take 100 mg by mouth 2 (two) times daily.     [name]. levothyroxine (SYNTHROID, LEVOTHROID) 88 MCG tablet Take 1 tablet (88 mcg total) by mouth daily. 90 tablet 0  . lurasidone (LATUDA) 20 MG TABS tablet Take by mouth daily.    . ondansetron (ZOFRAN-ODT) 4 MG disintegrating tablet Take 1 tablet (4 mg total) by mouth every 8 (eight) hours as needed for nausea or vomiting. 20 tablet 3  . tizanidine (ZANAFLEX) 6 MG capsule TAKE 1 CAPSULE (6 MG TOTAL) BY MOUTH 3 (THREE) TIMES DAILY AS NEEDED FOR MUSCLE SPASMS. 60 capsule 3  . VENTOLIN HFA 108 (90 Base) MCG/ACT inhaler INHALE 2 PUFFS INTO THE LUNGS EVERY 6 (SIX) HOURS AS NEEDED. 18 Inhaler 2  . Vilazodone HCl (VIIBRYD) 40 MG TABS Take by mouth daily.     No current facility-administered medications on file prior to visit.     Social History  Substance Use Topics  . Smoking status: Light Tobacco Smoker    Packs/day: 0.25    Types: Cigarettes  . Smokeless tobacco: Never Used  . Alcohol use Yes     Comment: once a week  Review of Systems  Constitutional: Negative for chills and fever.  Respiratory: Negative for cough.   Cardiovascular: Negative for chest pain and palpitations.  Gastrointestinal: Positive for diarrhea. Negative for abdominal distention, abdominal pain, blood in stool, nausea and vomiting.  Genitourinary: Positive for dysuria and urgency.  Hematological: Bruises/bleeds easily.      Objective:    BP 122/76   Pulse 96   Temp 98.1 F (36.7 C) (Oral)   Ht 5\' 4"  (1.626 m)   Wt 256 lb (116.1 kg)   SpO2 98%   BMI 43.94 kg/m    Physical Exam  Constitutional: She appears well-developed and well-nourished.  Eyes: Conjunctivae are normal.  Cardiovascular: Normal rate, regular rhythm, normal heart sounds  and normal pulses.   Pulmonary/Chest: Effort normal and breath sounds normal. She has no wheezes. She has no rhonchi. She has no rales.  Abdominal: Soft. Normal appearance and bowel sounds are normal. She exhibits no distension, no fluid wave, no ascites and no mass. There is no tenderness. There is no rigidity, no rebound, no guarding and no CVA tenderness.  Neurological: She is alert.  Skin: Skin is warm and dry.     2 discrete ecchymotic areas noted bilateral arms. Each less than 2 cm. No other bruises appreciated.  Psychiatric: She has a normal mood and affect. Her speech is normal and behavior is normal. Thought content normal.  Vitals reviewed.      Assessment & Plan:   Problem List Items Addressed This Visit      Other   Loose stools - Primary    New complaint. Reassured by benign abdominal exam. Etiology nonspecific at this time. Pending CMP, and US ultrasound; follow-up in 2 weeks. Consider referral to GI if no improvement.       Relevant Orders   Comprehensive metabolic panel   US Abdomen Complete   Dysuria    Etiology nonspecific at this time. Considering overactive bladder versus UTI. Pending UA. Follow-up in 2 weeks.      Relevant Orders   POCT urinalysis dipstick (Completed)   Bruising    2 discrete bruises noted bilateral arms. No epistasis, blood in stool. Pending CBC. Will monitor.       Relevant Orders   CBC with Differential/Platelet        I have discontinued Ms. Morawski's hydrocortisone, atorvastatin, ropinirole, and rOPINIRole. I am also having her maintain her furosemide, Diclofenac Sodium, clonazePAM, ondansetron, levothyroxine, famotidine, Vilazodone HCl, gabapentin, lurasidone, benztropine, ergocalciferol, VENTOLIN HFA, and tizanidine.   No orders of the defined types were placed in this encounter.   Return precautions given.   Risks, benefits, and alternatives of the medications and treatment plan prescribed today were discussed, and patient  expressed understanding.   Education regarding symptom management and diagnosis given to patient on AVS.  Continue to follow with TULLO, Mar DaringERESA L, MD for routine health maintenance.   [name] and I agreed with plan.   Rennie PlowmanMargaret Justyce Yeater, FNP

## 2015-12-03 NOTE — Assessment & Plan Note (Signed)
New complaint. Reassured by benign abdominal exam. Etiology nonspecific at this time. Pending CMP, and US ultrasound; follow-up in 2 weeks. Consider referral to GI if no improvement.

## 2015-12-03 NOTE — Assessment & Plan Note (Signed)
2 discrete bruises noted bilateral arms. No epistasis, blood in stool. Pending CBC. Will monitor.

## 2015-12-03 NOTE — Assessment & Plan Note (Signed)
Etiology nonspecific at this time. Considering overactive bladder versus UTI. Pending UA. Follow-up in 2 weeks.

## 2015-12-03 NOTE — Progress Notes (Signed)
Pre visit review using our clinic review tool, if applicable. No additional management support is needed unless otherwise documented below in the visit note. 

## 2015-12-03 NOTE — Patient Instructions (Signed)
Follow up in 2 weeks if symptoms are not improved; sooner if change, worsen  Labs Urine US abdomen  Pleasure meeting you

## 2015-12-04 ENCOUNTER — Ambulatory Visit
Admission: RE | Admit: 2015-12-04 | Discharge: 2015-12-04 | Disposition: A | Discharge status: Home / Self Care | Payer: 59 | Source: Ambulatory Visit | Attending: Family | Admitting: Family

## 2015-12-04 DIAGNOSIS — R195 Other fecal abnormalities: Secondary | ICD-10-CM | POA: Diagnosis not present

## 2015-12-04 LAB — URINE CULTURE

## 2015-12-14 ENCOUNTER — Ambulatory Visit (INDEPENDENT_AMBULATORY_CARE_PROVIDER_SITE_OTHER): Payer: 59 | Admitting: Family

## 2015-12-14 ENCOUNTER — Encounter: Payer: Self-pay | Admitting: Family

## 2015-12-14 VITALS — BP 146/90 | HR 105 | Temp 98.3°F | Ht 64.0 in | Wt 258.4 lb

## 2015-12-14 DIAGNOSIS — N3281 Overactive bladder: Secondary | ICD-10-CM

## 2015-12-14 MED ORDER — OXYBUTYNIN CHLORIDE 5 MG PO TABS
2.5000 mg | ORAL_TABLET | Freq: Two times a day (BID) | ORAL | 0 refills | Status: DC
Start: 2015-12-14 — End: 2016-01-25

## 2015-12-14 NOTE — Progress Notes (Signed)
Subjective:    Patient ID: [name] Rohrer, female    DOB: 05/01/1955, 60 y.o.   MRN: [id number]  CC: [name] Ezra is a 60 y.o. female who presents today for follow up.   HPI: Patient for follow-up complaint of urinary incontinence for months, unchanged. Urine culture was not indicative of UTI. Notes dribbling, urinary urgency. Urinary incontinence with cough.  Wearing pads. No external vaginal irritation, change in vaginal discharge, vaginal bleeding.    Takes lasix PRN, not daily.   [facility] CMN, pap UTD.      HISTORY:  Past Medical History:  Diagnosis Date  . Anxiety   . Arthritis   . Atopic dermatitis    scalp  . Depression   . Hyperlipidemia   . Hypertension    pt denies  . Thyroid disease   . Tobacco abuse    Past Surgical History:  Procedure Laterality Date  . LUMBAR DISC SURGERY    . ROTATOR CUFF REPAIR Bilateral    Family History  Problem Relation Age of Onset  . Arthritis Mother   . Macular degeneration Mother   . Heart disease Father     Allergies: Ancef [cefazolin sodium]; Penicillins; Clarithromycin; Eggs or egg-derived products; and Seroquel [quetiapine fumarate] Current Outpatient Prescriptions on File Prior to Visit  Medication Sig Dispense Refill  . benztropine (COGENTIN) 2 MG tablet     . clonazePAM (KLONOPIN) 1 MG tablet Take 0.5 mg by mouth 2 (two) times daily.     . Diclofenac Sodium 3 % GEL Diclofenac,baclofen, cyclobenzaprine, lidocaine gel to affected area twice daily 100 g 1  . ergocalciferol (VITAMIN D2) 50000 units capsule Take 1 capsule (50,000 Units total) by mouth once a week. 12 capsule 0  . famotidine (PEPCID) 20 MG tablet Take 1 tablet (20 mg total) by mouth daily. With breakfast 90 tablet 3  . furosemide (LASIX) 20 MG tablet Take 1 tablet (20 mg total) by mouth 2 (two) times daily. (Patient taking differently: Take 20 mg by mouth 2 (two) times daily as needed. ) 30 tablet 6  . gabapentin (NEURONTIN) 100 MG capsule Take 100 mg  by mouth 2 (two) times daily.     [name]. levothyroxine (SYNTHROID, LEVOTHROID) 88 MCG tablet Take 1 tablet (88 mcg total) by mouth daily. 90 tablet 0  . lurasidone (LATUDA) 20 MG TABS tablet Take by mouth daily.    . ondansetron (ZOFRAN-ODT) 4 MG disintegrating tablet Take 1 tablet (4 mg total) by mouth every 8 (eight) hours as needed for nausea or vomiting. 20 tablet 3  . VENTOLIN HFA 108 (90 Base) MCG/ACT inhaler INHALE 2 PUFFS INTO THE LUNGS EVERY 6 (SIX) HOURS AS NEEDED. 18 Inhaler 2  . Vilazodone HCl (VIIBRYD) 40 MG TABS Take by mouth daily.     No current facility-administered medications on file prior to visit.     Social History  Substance Use Topics  . Smoking status: Light Tobacco Smoker    Packs/day: 0.25    Types: Cigarettes  . Smokeless tobacco: Never Used  . Alcohol use Yes     Comment: once a week     Review of Systems  Constitutional: Negative for chills and fever.  Respiratory: Negative for cough.   Cardiovascular: Negative for chest pain and palpitations.  Gastrointestinal: Negative for abdominal distention, abdominal pain, nausea and vomiting.  Genitourinary: Positive for urgency. Negative for dysuria, flank pain, frequency, hematuria, vaginal bleeding and vaginal discharge.      Objective:    BP [name](!)  146/90   Pulse (!) 105   Temp 98.3 F (36.8 C) (Oral)   Ht 5\' 4"  (1.626 m)   Wt 258 lb 6.4 oz (117.2 kg)   SpO2 96%   BMI 44.35 kg/m  BP Readings from Last 3 Encounters:  12/14/15 (!) 146/90  12/03/15 122/76  10/09/15 112/72   Wt Readings from Last 3 Encounters:  12/14/15 258 lb 6.4 oz (117.2 kg)  12/03/15 256 lb (116.1 kg)  10/09/15 248 lb 6.4 oz (112.7 kg)    Physical Exam  Constitutional: She appears well-developed and well-nourished.  Eyes: Conjunctivae are normal.  Cardiovascular: Normal rate, regular rhythm, normal heart sounds and normal pulses.   Pulmonary/Chest: Effort normal and breath sounds normal. She has no wheezes. She has no rhonchi. She  has no rales.  Neurological: She is alert.  Skin: Skin is warm and dry.  Psychiatric: She has a normal mood and affect. Her speech is normal and behavior is normal. Thought content normal.  Vitals reviewed.      Assessment & Plan:   Problem List Items Addressed This Visit      Genitourinary   Overactive bladder - Primary    New. Working diagnosis OAB, consistent with symptoms. Patient preferred to trial short course anti spasmodic prior to pelvic floor PT. Follow up in one month to see how medication is working and for pelvic exam.      Relevant Medications   oxybutynin (DITROPAN) 5 MG tablet       I have discontinued Ms. Sonnier's tizanidine. I am also having her start on oxybutynin. Additionally, I am having her maintain her furosemide, Diclofenac Sodium, clonazePAM, ondansetron, levothyroxine, famotidine, Vilazodone HCl, gabapentin, lurasidone, benztropine, ergocalciferol, and VENTOLIN HFA.   Meds ordered this encounter  Medications  . oxybutynin (DITROPAN) 5 MG tablet    Sig: Take 0.5 tablets (2.5 mg total) by mouth 2 (two) times daily.    Dispense:  30 tablet    Refill:  0    Order Specific Question:   Supervising Provider    Answer:   Sherlene ShamsULLO, TERESA L [[id number]]    Return precautions given.   Risks, benefits, and alternatives of the medications and treatment plan prescribed today were discussed, and patient expressed understanding.   Education regarding symptom management and diagnosis given to patient on AVS.  Continue to follow with TULLO, Mar DaringERESA L, MD for routine health maintenance.   [name] Byland and I agreed with plan.   Rennie PlowmanMargaret Arnett, FNP

## 2015-12-14 NOTE — Progress Notes (Signed)
Pre visit review using our clinic review tool, if applicable. No additional management support is needed unless otherwise documented below in the visit note. 

## 2015-12-14 NOTE — Patient Instructions (Signed)
Trial of low dose oxybutynin  F/u one month for pelvic exam and to see how symptoms are   Overactive Bladder, Adult Introduction Overactive bladder is a group of urinary symptoms. With overactive bladder, you may suddenly feel the need to pass urine (urinate) right away. After feeling this sudden urge, you might also leak urine if you cannot get to the bathroom fast enough (urinary incontinence). These symptoms might interfere with your daily work or social activities. Overactive bladder symptoms may also wake you up at night. Overactive bladder affects the nerve signals between your bladder and your brain. Your bladder may get the signal to empty before it is full. Very sensitive muscles can also make your bladder squeeze too soon. What are the causes? Many things can cause an overactive bladder. Possible causes include:  Urinary tract infection.  Infection of nearby tissues, such as the prostate.  Prostate enlargement.  Being pregnant with twins or more (multiples).  Surgery on the uterus or urethra.  Bladder stones, inflammation, or tumors.  Drinking too much caffeine or alcohol.  Certain medicines, especially those that you take to help your body get rid of extra fluid (diuretics) by increasing urine production.  Muscle or nerve weakness, especially from:  A spinal cord injury.  Stroke.  Multiple sclerosis.  Parkinson disease.  Diabetes. This can cause a high urine volume that fills the bladder so quickly that the normal urge to urinate is triggered very strongly.  Constipation. A buildup of too much stool can put pressure on your bladder. What increases the risk? You may be at greater risk for overactive bladder if you:  Are an older adult.  Smoke.  Are going through menopause.  Have prostate problems.  Have a neurological disease, such as stroke, dementia, Parkinson disease, or multiple sclerosis (MS).  Eat or drink things that irritate the bladder. These  include alcohol, spicy food, and caffeine.  Are overweight or obese. What are the signs or symptoms? The signs and symptoms of an overactive bladder include:  Sudden, strong urges to urinate.  Leaking urine.  Urinating eight or more times per day.  Waking up to urinate two or more times per night. How is this diagnosed? Your health care provider may suspect overactive bladder based on your symptoms. The health care provider will do a physical exam and take your medical history. Blood or urine tests may also be done. For example, you might need to have a bladder function test to check how well you can hold your urine. You might also need to see a health care provider who specializes in the urinary tract (urologist). How is this treated? Treatment for overactive bladder depends on the cause of your condition and whether it is mild or severe. Certain treatments can be done in your health care provider's office or clinic. You can also make lifestyle changes at home. Options include: Behavioral Treatments  Biofeedback. A specialist uses sensors to help you become aware of your body's signals.  Keeping a daily log of when you need to urinate and what happens after the urge. This may help you manage your condition.  Bladder training. This helps you learn to control the urge to urinate by following a schedule that directs you to urinate at regular intervals (timed voiding). At first, you might have to wait a few minutes after feeling the urge. In time, you should be able to schedule bathroom visits an hour or more apart.  Kegel exercises. These are exercises to strengthen the  pelvic floor muscles, which support the bladder. Toning these muscles can help you control urination, even if your bladder muscles are overactive. A specialist will teach you how to do these exercises correctly. They require daily practice.  Weight loss. If you are obese or overweight, losing weight might relieve your  symptoms of overactive bladder. Talk to your health care provider about losing weight and whether there is a specific program or method that would work best for you.  Diet change. This might help if constipation is making your overactive bladder worse. Your health care provider or a dietitian can explain ways to change what you eat to ease constipation. You might also need to consume less alcohol and caffeine or drink other fluids at different times of the day.  Stopping smoking.  Wearing pads to absorb leakage while you wait for other treatments to take effect. Physical Treatments  Electrical stimulation. Electrodes send gentle pulses of electricity to strengthen the nerves or muscles that help to control the bladder. Sometimes, the electrodes are placed outside of the body. In other cases, they might be placed inside the body (implanted). This treatment can take several months to have an effect.  Supportive devices. Women may need a plastic device that fits into the vagina and supports the bladder (pessary). Medicines  Several medicines can help treat overactive bladder and are usually used along with other treatments. Some are injected into the muscles involved in urination. Others come in pill form. Your health care provider may prescribe:  Antispasmodics. These medicines block the signals that the nerves send to the bladder. This keeps the bladder from releasing urine at the wrong time.  Tricyclic antidepressants. These types of antidepressants also relax bladder muscles. Surgery  You may have a device implanted to help manage the nerve signals that indicate when you need to urinate.  You may have surgery to implant electrodes for electrical stimulation.  Sometimes, very severe cases of overactive bladder require surgery to change the shape of the bladder. Follow these instructions at home:  Take medicines only as directed by your health care provider.  Use any implants or a pessary  as directed by your health care provider.  Make any diet or lifestyle changes that are recommended by your health care provider. These might include:  Drinking less fluid or drinking at different times of the day. If you need to urinate often during the night, you may need to stop drinking fluids early in the evening.  Cutting down on caffeine or alcohol. Both can make an overactive bladder worse. Caffeine is found in coffee, tea, and sodas.  Doing Kegel exercises to strengthen muscles.  Losing weight if you need to.  Eating a healthy and balanced diet to prevent constipation.  Keep a journal or log to track how much and when you drink and also when you feel the need to urinate. This will help your health care provider to monitor your condition. Contact a health care provider if:  Your symptoms do not get better after treatment.  Your pain and discomfort are getting worse.  You have more frequent urges to urinate.  You have a fever. Get help right away if: You are not able to control your bladder at all. This information is not intended to replace advice given to you by your health care provider. Make sure you discuss any questions you have with your health care provider. Document Released: 10/16/2008 Document Revised: 05/28/2015 Document Reviewed: 05/15/2013  2017 [employer]vier

## 2015-12-15 ENCOUNTER — Encounter: Payer: Self-pay | Admitting: Family

## 2015-12-15 DIAGNOSIS — N3281 Overactive bladder: Secondary | ICD-10-CM | POA: Insufficient documentation

## 2015-12-15 NOTE — Assessment & Plan Note (Addendum)
New. Working diagnosis OAB, consistent with symptoms. Patient preferred to trial short course anti spasmodic prior to pelvic floor PT. Follow up in one month to see how medication is working and for pelvic exam.

## 2015-12-16 ENCOUNTER — Telehealth: Payer: Self-pay

## 2015-12-16 ENCOUNTER — Ambulatory Visit (INDEPENDENT_AMBULATORY_CARE_PROVIDER_SITE_OTHER): Payer: 59 | Admitting: Psychology

## 2015-12-16 DIAGNOSIS — F0631 Mood disorder due to known physiological condition with depressive features: Secondary | ICD-10-CM | POA: Diagnosis not present

## 2015-12-16 DIAGNOSIS — F3181 Bipolar II disorder: Secondary | ICD-10-CM

## 2015-12-16 NOTE — Telephone Encounter (Signed)
Pharmacy has noticed that patient has multiple rescue inhaler fills without filling a controller medication at [facility] in the last 180 days.  We are reaching out on behalf of patient to determine if patient is appropriate to start a daily asthma therapy.  Please send a new prescription for controller therapy if it is appropriate.  [facility].  [facility].

## 2015-12-17 NOTE — Telephone Encounter (Signed)
Forwarded below note to PCP

## 2015-12-17 NOTE — Telephone Encounter (Signed)
Not without an documentation of COPd, which has never been tested for or documented. Patient needs to make appt to discuss

## 2015-12-17 NOTE — Telephone Encounter (Signed)
Pt has an appointment on 12/22/15 with Arnett.

## 2015-12-17 NOTE — Telephone Encounter (Signed)
Call pt-  Make an appt for her to see PCP re: use of inhalers.

## 2015-12-19 ENCOUNTER — Other Ambulatory Visit: Payer: Self-pay | Admitting: Internal Medicine

## 2015-12-22 ENCOUNTER — Encounter: Payer: Self-pay | Admitting: Family

## 2015-12-22 ENCOUNTER — Ambulatory Visit (INDEPENDENT_AMBULATORY_CARE_PROVIDER_SITE_OTHER): Payer: 59 | Admitting: Family

## 2015-12-22 VITALS — BP 125/85 | HR 85 | Temp 98.7°F | Ht 64.0 in | Wt 258.6 lb

## 2015-12-22 DIAGNOSIS — E034 Atrophy of thyroid (acquired): Secondary | ICD-10-CM | POA: Diagnosis not present

## 2015-12-22 DIAGNOSIS — N3281 Overactive bladder: Secondary | ICD-10-CM

## 2015-12-22 MED ORDER — LEVOTHYROXINE SODIUM 88 MCG PO TABS: 88.0000 ug | ORAL_TABLET | Freq: Every day | ORAL | 1 refills | Status: DC

## 2015-12-22 NOTE — Progress Notes (Signed)
Pre visit review using our clinic review tool, if applicable. No additional management support is needed unless otherwise documented below in the visit note. 

## 2015-12-22 NOTE — Assessment & Plan Note (Signed)
TSH 8/17 within normal limits. Refilled medication as requested.

## 2015-12-22 NOTE — Progress Notes (Signed)
Subjective:    Patient ID: [name], female    DOB: 04/14/1955, 60 y.o.   MRN: [id number]  CC: [name] is a 60 y.o. female who presents today for follow up.   HPI: F/u for OAB. Urinary urgency has improved and no longer having incontinence. No significant dry mouth, constipation. No fever, dysuria. Needs refill of synthroid.      HISTORY:  Past Medical History:  Diagnosis Date  . Anxiety   . Arthritis   . Atopic dermatitis    scalp  . Depression   . Hyperlipidemia   . Hypertension    pt denies  . Thyroid disease   . Tobacco abuse    Past Surgical History:  Procedure Laterality Date  . LUMBAR DISC SURGERY    . ROTATOR CUFF REPAIR Bilateral    Family History  Problem Relation Age of Onset  . Arthritis Mother   . Macular degeneration Mother   . Heart disease Father     Allergies: Ancef [cefazolin sodium]; Penicillins; Clarithromycin; Eggs or egg-derived products; and Seroquel [quetiapine fumarate] Current Outpatient Prescriptions on File Prior to Visit  Medication Sig Dispense Refill  . clonazePAM (KLONOPIN) 1 MG tablet Take 0.5 mg by mouth 2 (two) times daily.     . Diclofenac Sodium 3 % GEL Diclofenac,baclofen, cyclobenzaprine, lidocaine gel to affected area twice daily 100 g 1  . ergocalciferol (VITAMIN D2) 50000 units capsule Take 1 capsule (50,000 Units total) by mouth once a week. 12 capsule 0  . famotidine (PEPCID) 20 MG tablet Take 1 tablet (20 mg total) by mouth daily. With breakfast 90 tablet 3  . furosemide (LASIX) 20 MG tablet Take 1 tablet (20 mg total) by mouth 2 (two) times daily. (Patient taking differently: Take 20 mg by mouth 2 (two) times daily as needed. ) 30 tablet 6  . gabapentin (NEURONTIN) 100 MG capsule Take 100 mg by mouth 2 (two) times daily.     [name]. lurasidone (LATUDA) 20 MG TABS tablet Take by mouth daily.    . ondansetron (ZOFRAN-ODT) 4 MG disintegrating tablet Take 1 tablet (4 mg total) by mouth every 8 (eight) hours as needed for  nausea or vomiting. 20 tablet 3  . oxybutynin (DITROPAN) 5 MG tablet Take 0.5 tablets (2.5 mg total) by mouth 2 (two) times daily. 30 tablet 0  . VENTOLIN HFA 108 (90 Base) MCG/ACT inhaler INHALE 2 PUFFS INTO THE LUNGS EVERY 6 (SIX) HOURS AS NEEDED. 18 Inhaler 2  . Vilazodone HCl (VIIBRYD) 40 MG TABS Take by mouth daily.     No current facility-administered medications on file prior to visit.     Social History  Substance Use Topics  . Smoking status: Light Tobacco Smoker    Packs/day: 0.25    Types: Cigarettes  . Smokeless tobacco: Never Used  . Alcohol use Yes     Comment: once a week     Review of Systems  Constitutional: Negative for chills and fever.  Respiratory: Negative for cough.   Cardiovascular: Negative for chest pain and palpitations.  Gastrointestinal: Negative for nausea and vomiting.  Genitourinary: Positive for urgency. Negative for dysuria, flank pain and frequency.      Objective:    BP 125/85   Pulse 85   Temp 98.7 F (37.1 C) (Oral)   Ht 5\' 4"  (1.626 m)   Wt 258 lb 9.6 oz (117.3 kg)   SpO2 97%   BMI 44.39 kg/m  BP Readings from Last 3 Encounters:  12/22/15  125/85  12/14/15 (!) 146/90  12/03/15 122/76   Wt Readings from Last 3 Encounters:  12/22/15 258 lb 9.6 oz (117.3 kg)  12/14/15 258 lb 6.4 oz (117.2 kg)  12/03/15 256 lb (116.1 kg)    Physical Exam  Constitutional: She appears well-developed and well-nourished.  Eyes: Conjunctivae are normal.  Cardiovascular: Normal rate, regular rhythm, normal heart sounds and normal pulses.   Pulmonary/Chest: Effort normal and breath sounds normal. She has no wheezes. She has no rhonchi. She has no rales.  Neurological: She is alert.  Skin: Skin is warm and dry.  Psychiatric: She has a normal mood and affect. Her speech is normal and behavior is normal. Thought content normal.  Vitals reviewed.      Assessment & Plan:   Problem List Items Addressed This Visit      Endocrine   Hypothyroidism due  to acquired atrophy of thyroid - Primary    TSH 8/17 within normal limits. Refilled medication as requested.      Relevant Medications   levothyroxine (SYNTHROID, LEVOTHROID) 88 MCG tablet     Genitourinary   Overactive bladder    pleased to see improvement. Politely declines pelvic exam today or referral to pelvic floor strengthening at this time. Will continue medication.           I have discontinued Ms. Brainerd's benztropine. I am also having her maintain her furosemide, Diclofenac Sodium, clonazePAM, ondansetron, famotidine, Vilazodone HCl, gabapentin, lurasidone, ergocalciferol, VENTOLIN HFA, oxybutynin, tizanidine, and levothyroxine.   Meds ordered this encounter  Medications  . tizanidine (ZANAFLEX) 6 MG capsule  . DISCONTD: levothyroxine (SYNTHROID, LEVOTHROID) 88 MCG tablet    Sig: Take 1 tablet (88 mcg total) by mouth daily.    Dispense:  90 tablet    Refill:  1  . levothyroxine (SYNTHROID, LEVOTHROID) 88 MCG tablet    Sig: Take 1 tablet (88 mcg total) by mouth daily.    Dispense:  90 tablet    Refill:  1    Order Specific Question:   Supervising Provider    Answer:   [name] [[id number]]    Return precautions given.   Risks, benefits, and alternatives of the medications and treatment plan prescribed today were discussed, and patient expressed understanding.   Education regarding symptom management and diagnosis given to patient on AVS.  Continue to follow with Frye, Renee DaringERESA L, MD for routine health maintenance.   [name] and I agreed with plan.   Rennie PlowmanMargaret Arnett, FNP

## 2015-12-22 NOTE — Patient Instructions (Signed)
Continue oxybutnin.  Monitor BP once a week, goal < 140/90  Happy holidays!

## 2015-12-22 NOTE — Assessment & Plan Note (Signed)
pleased to see improvement. Politely declines pelvic exam today or referral to pelvic floor strengthening at this time. Will continue medication.

## 2015-12-23 ENCOUNTER — Ambulatory Visit: Payer: 59 | Admitting: Psychology

## 2015-12-31 ENCOUNTER — Ambulatory Visit: Payer: Self-pay | Admitting: Family

## 2016-01-06 ENCOUNTER — Ambulatory Visit: Payer: 59 | Admitting: Psychology

## 2016-01-06 NOTE — Progress Notes (Deleted)
Subjective:    Patient ID: [name], female    DOB: [birth year], 61 y.o.   MRN: [id number]  CC: [name] is a 61 y.o. female who presents today for follow up.   HPI: Polyp seen on lung.   Smoker    HISTORY:  Past Medical History:  Diagnosis Date  . Anxiety   . Arthritis   . Atopic dermatitis    scalp  . Depression   . Hyperlipidemia   . Hypertension    pt denies  . Thyroid disease   . Tobacco abuse    Past Surgical History:  Procedure Laterality Date  . LUMBAR DISC SURGERY    . ROTATOR CUFF REPAIR Bilateral    Family History  Problem Relation Age of Onset  . Arthritis Mother   . Macular degeneration Mother   . Heart disease Father     Allergies: Ancef [cefazolin sodium]; Penicillins; Clarithromycin; Eggs or egg-derived products; and Seroquel [quetiapine fumarate] Current Outpatient Prescriptions on File Prior to Visit  Medication Sig Dispense Refill  . clonazePAM (KLONOPIN) 1 MG tablet Take 0.5 mg by mouth 2 (two) times daily.     . Diclofenac Sodium 3 % GEL Diclofenac,baclofen, cyclobenzaprine, lidocaine gel to affected area twice daily 100 g 1  . ergocalciferol (VITAMIN D2) 50000 units capsule Take 1 capsule (50,000 Units total) by mouth once a week. 12 capsule 0  . famotidine (PEPCID) 20 MG tablet Take 1 tablet (20 mg total) by mouth daily. With breakfast 90 tablet 3  . furosemide (LASIX) 20 MG tablet Take 1 tablet (20 mg total) by mouth 2 (two) times daily. (Patient taking differently: Take 20 mg by mouth 2 (two) times daily as needed. ) 30 tablet 6  . gabapentin (NEURONTIN) 100 MG capsule Take 100 mg by mouth 2 (two) times daily.     [name] levothyroxine (SYNTHROID, LEVOTHROID) 88 MCG tablet Take 1 tablet (88 mcg total) by mouth daily. 90 tablet 1  . lurasidone (LATUDA) 20 MG TABS tablet Take by mouth daily.    . ondansetron (ZOFRAN-ODT) 4 MG disintegrating tablet Take 1 tablet (4 mg total) by mouth every 8 (eight) hours as needed for nausea or vomiting. 20  tablet 3  . oxybutynin (DITROPAN) 5 MG tablet Take 0.5 tablets (2.5 mg total) by mouth 2 (two) times daily. 30 tablet 0  . tizanidine (ZANAFLEX) 6 MG capsule     . VENTOLIN HFA 108 (90 Base) MCG/ACT inhaler INHALE 2 PUFFS INTO THE LUNGS EVERY 6 (SIX) HOURS AS NEEDED. 18 Inhaler 2  . Vilazodone HCl (VIIBRYD) 40 MG TABS Take by mouth daily.     No current facility-administered medications on file prior to visit.     Social History  Substance Use Topics  . Smoking status: Light Tobacco Smoker    Packs/day: 0.25    Types: Cigarettes  . Smokeless tobacco: Never Used  . Alcohol use Yes     Comment: once a week     Review of Systems    Objective:    There were no vitals taken for this visit. BP Readings from Last 3 Encounters:  12/22/15 125/85  12/14/15 (!) 146/90  12/03/15 122/76   Wt Readings from Last 3 Encounters:  12/22/15 258 lb 9.6 oz (117.3 kg)  12/14/15 258 lb 6.4 oz (117.2 kg)  12/03/15 256 lb (116.1 kg)    Physical Exam     Assessment & Plan:   Problem List Items Addressed This Visit    None  I am having Ms. Gonsalves maintain her furosemide, Diclofenac Sodium, clonazePAM, ondansetron, famotidine, Vilazodone HCl, gabapentin, lurasidone, ergocalciferol, VENTOLIN HFA, oxybutynin, tizanidine, and levothyroxine.   No orders of the defined types were placed in this encounter.   Return precautions given.   Risks, benefits, and alternatives of the medications and treatment plan prescribed today were discussed, and patient expressed understanding.   Education regarding symptom management and diagnosis given to patient on AVS.  Continue to follow with TULLO, Mar DaringERESA L, MD for routine health maintenance.   [name] and I agreed with plan.   Renee PlowmanMargaret Quavis Klutz, FNP

## 2016-01-07 ENCOUNTER — Ambulatory Visit: Payer: Self-pay | Admitting: Family

## 2016-01-08 ENCOUNTER — Telehealth: Payer: Self-pay | Admitting: *Deleted

## 2016-01-08 NOTE — Telephone Encounter (Signed)
I 'm sorry, but I cannot come to the phone for patient calls.  I have no time to do that,  But I understand that she is concerned about a spot on her lungs. I have reviewed her chart and see no imaging that references a spot in her lungs.  If she can tell me where it was diagnosed and when,  We will request the records and get the appropriate workup done

## 2016-01-08 NOTE — Telephone Encounter (Signed)
Patient would like to speak to Dr. Darrick Huntsmanullo. Please advise.

## 2016-01-08 NOTE — Telephone Encounter (Signed)
Patient has concerns about her having a spot on her lung,pt requested a call to discuss further care  Pt contact [phone]
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
## 2016-01-11 ENCOUNTER — Other Ambulatory Visit: Payer: Self-pay | Admitting: Family

## 2016-01-11 DIAGNOSIS — N3281 Overactive bladder: Secondary | ICD-10-CM

## 2016-01-12 ENCOUNTER — Encounter: Payer: Self-pay | Admitting: Family

## 2016-01-12 ENCOUNTER — Ambulatory Visit (INDEPENDENT_AMBULATORY_CARE_PROVIDER_SITE_OTHER): Payer: 59 | Admitting: Family

## 2016-01-12 VITALS — BP 154/74 | HR 103 | Temp 98.2°F | Ht 64.0 in | Wt 262.6 lb

## 2016-01-12 DIAGNOSIS — R918 Other nonspecific abnormal finding of lung field: Secondary | ICD-10-CM

## 2016-01-12 NOTE — Progress Notes (Signed)
Pre visit review using our clinic review tool, if applicable. No additional management support is needed unless otherwise documented below in the visit note. 
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
## 2016-01-12 NOTE — Patient Instructions (Signed)
We are requesting records - let's await on CT results and go from there.   If you do not hear from me in the next week, please call office to ensure we get the records.

## 2016-01-13 ENCOUNTER — Ambulatory Visit (INDEPENDENT_AMBULATORY_CARE_PROVIDER_SITE_OTHER): Payer: 59 | Admitting: Psychology

## 2016-01-13 ENCOUNTER — Telehealth: Payer: Self-pay | Admitting: Family

## 2016-01-13 ENCOUNTER — Encounter: Payer: Self-pay | Admitting: Family

## 2016-01-13 DIAGNOSIS — F4481 Dissociative identity disorder: Secondary | ICD-10-CM

## 2016-01-13 DIAGNOSIS — F4320 Adjustment disorder, unspecified: Secondary | ICD-10-CM

## 2016-01-13 DIAGNOSIS — R911 Solitary pulmonary nodule: Secondary | ICD-10-CM | POA: Insufficient documentation

## 2016-01-13 DIAGNOSIS — IMO0001 Reserved for inherently not codable concepts without codable children: Secondary | ICD-10-CM | POA: Insufficient documentation
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
## 2016-01-13 NOTE — Telephone Encounter (Signed)
Letter has been mailed.

## 2016-01-13 NOTE — Telephone Encounter (Signed)
Call pt and then MAIL letter WITH results-  Ms. Olegar,   I have good news. I did receive results [facility] yesterday. I reviewed your CT of her chest which shows a 2 mm right upper nodule and a small 4 mm nodule also in the right lung. It also shows fatty liver disease which we also saw on ultrasound that we did in December. This is a known diagnosis by your PCP. She may follow this finding with her PCP.   As for your nodules, a low risk patient would require no follow up since these nodules are so small ( less than 4mm).     However treating VERY conservatively, since you have a history of smoking for the past 10 years, I would recommended follow-up in 6 months with a repeat CT of your chest to ensure that these nodules have not changed. If they have not changed, no further follow up needed.  Of note, I also reviewed your CT cervical spine which showed no acute fracture. 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 Also reviewed CT of your head which showed no evidence for an acute finding. Lastly I reviewed the x-ray of her left knee which showed no acute fracture or joint effusion. All results are attached.  You may make your 6 month follow-up for repeat chest CT with myself or Dr Darrick Huntsmanullo, either one of us would be happy to see you.   All my best,  Claris CheMargaret, NP

## 2016-01-14 ENCOUNTER — Ambulatory Visit: Payer: Self-pay | Admitting: Family

## 2016-01-19 ENCOUNTER — Ambulatory Visit: Payer: Self-pay | Admitting: Family

## 2016-01-20 ENCOUNTER — Ambulatory Visit: Payer: 59 | Admitting: Psychology

## 2016-01-25 ENCOUNTER — Other Ambulatory Visit: Payer: Self-pay

## 2016-01-25 DIAGNOSIS — N3281 Overactive bladder: Secondary | ICD-10-CM

## 2016-01-25 MED ORDER — OXYBUTYNIN CHLORIDE 5 MG PO TABS: 2.5000 mg | ORAL_TABLET | Freq: Two times a day (BID) | ORAL | 0 refills | Status: DC

## 2016-01-25 NOTE — Telephone Encounter (Signed)
Medication has been refilled.

## 2016-01-26 ENCOUNTER — Ambulatory Visit (INDEPENDENT_AMBULATORY_CARE_PROVIDER_SITE_OTHER): Payer: 59 | Admitting: Family

## 2016-01-26 ENCOUNTER — Encounter: Payer: Self-pay | Admitting: Family

## 2016-01-26 VITALS — BP 136/86 | HR 98 | Temp 94.0°F | Ht 64.0 in

## 2016-01-26 DIAGNOSIS — R51 Headache: Secondary | ICD-10-CM | POA: Diagnosis not present

## 2016-01-26 DIAGNOSIS — R195 Other fecal abnormalities: Secondary | ICD-10-CM | POA: Diagnosis not present

## 2016-01-26 DIAGNOSIS — R519 Headache, unspecified: Secondary | ICD-10-CM

## 2016-01-26 MED ORDER — KETOROLAC TROMETHAMINE 30 MG/ML IJ SOLN
30.0000 mg | Freq: Once | INTRAMUSCULAR | Status: AC
  Administered 2016-01-26: 30 mg via INTRAMUSCULAR

## 2016-01-26 NOTE — Assessment & Plan Note (Addendum)
Flu neg. Reassured by normal neurologic exam. Suspect muscle spasm in neck contributory. Toradol 30 IM once as abortive therapy. Return precautions given
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
## 2016-01-26 NOTE — Patient Instructions (Signed)
Hope that the toradol relieves your pain.   Please also keep headache diary so we can better identify your triggers  Please continue to use heat and gentle stretching for your neck pain and make f/u appt if no improvement.  If there is no improvement in your symptoms, or if there is any worsening of symptoms, or if you have any additional concerns, please return for re-evaluation; or, if we are closed, consider going to the Emergency Room for evaluation if symptoms urgent.

## 2016-01-26 NOTE — Assessment & Plan Note (Addendum)
Chronic. Unable to identify cause. Patient and I decided to consult GI.  Pending consult.

## 2016-01-26 NOTE — Progress Notes (Signed)
Pre visit review using our clinic review tool, if applicable. No additional management support is needed unless otherwise documented below in the visit note. 

## 2016-02-02 ENCOUNTER — Encounter: Payer: Self-pay | Admitting: Gastroenterology

## 2016-02-10 ENCOUNTER — Ambulatory Visit: Payer: 59 | Admitting: Psychology

## 2016-02-12 ENCOUNTER — Emergency Department (HOSPITAL_COMMUNITY)
Admission: EM | Admit: 2016-02-12 | Discharge: 2016-02-12 | Disposition: A | Discharge status: Home / Self Care | Payer: 59 | Attending: Emergency Medicine | Admitting: Emergency Medicine

## 2016-02-12 ENCOUNTER — Encounter: Payer: Self-pay | Admitting: Family Medicine

## 2016-02-12 ENCOUNTER — Ambulatory Visit (INDEPENDENT_AMBULATORY_CARE_PROVIDER_SITE_OTHER): Payer: 59 | Admitting: Family Medicine

## 2016-02-12 ENCOUNTER — Other Ambulatory Visit: Payer: Self-pay | Admitting: Internal Medicine

## 2016-02-12 ENCOUNTER — Telehealth: Payer: Self-pay

## 2016-02-12 ENCOUNTER — Encounter (HOSPITAL_COMMUNITY): Payer: Self-pay | Admitting: *Deleted

## 2016-02-12 VITALS — BP 150/90 | HR 94 | Temp 98.6°F | Wt 260.4 lb

## 2016-02-12 DIAGNOSIS — G8929 Other chronic pain: Secondary | ICD-10-CM

## 2016-02-12 DIAGNOSIS — R51 Headache: Principal | ICD-10-CM

## 2016-02-12 DIAGNOSIS — Z5321 Procedure and treatment not carried out due to patient leaving prior to being seen by health care provider: Secondary | ICD-10-CM | POA: Diagnosis not present

## 2016-02-12 DIAGNOSIS — R519 Headache, unspecified: Secondary | ICD-10-CM

## 2016-02-12 NOTE — Patient Instructions (Signed)
Nice to see you. Please go to [facility] emergency room for evaluation of your headaches.  Patient has intractable headache associated with intermittent double vision for the last several weeks. Patient needs imaging of her brain.

## 2016-02-12 NOTE — Telephone Encounter (Signed)
Called pharmacy and patient still has refills on file for oxybutynin they will fill script.

## 2016-02-12 NOTE — Telephone Encounter (Signed)
Pt stated that they need oxybutynin (DITROPAN) 5 MG tablet refilled. Please advise, thank you!  Pharmacy - [id number] [address]- [place], [place] - [phone] [address]

## 2016-02-12 NOTE — ED Triage Notes (Signed)
The pt is also c/o sob and dizziness   Very apprehensive hyperventilating at present  She has panic attacks
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
## 2016-02-12 NOTE — Progress Notes (Signed)
Pre visit review using our clinic review tool, if applicable. No additional management support is needed unless otherwise documented below in the visit note. 

## 2016-02-12 NOTE — Telephone Encounter (Signed)
Noted. We'll contact them when this returns tomorrow.

## 2016-02-12 NOTE — Telephone Encounter (Signed)
Patients husband called and states the ER has informed them that the average wait has been 5 hours and they would like to have MRI scheduled for tomorrow instead of waiting at the Er. Please place order

## 2016-02-12 NOTE — Assessment & Plan Note (Signed)
Continues to have issues with headaches. Now with double vision and somewhat imbalanced when walking around. Attempted to schedule MRI to evaluate though this though it was unable to be completed today. Given patient's worst headache of life yesterday and double vision today I believe she needs imaging today. Discussed evaluation in the emergency room for imaging and treatment of her intractable headache. She will go to [facility] for evaluation. CMA will contact the ED to inform them the patient is on her way. Patient's significant other will transport her. They were given precautions to call 911 if those occurred.

## 2016-02-12 NOTE — Telephone Encounter (Signed)
Noted. Order placed.

## 2016-02-12 NOTE — Telephone Encounter (Signed)
[facility]. Patients husband notified. Informed patients husband, if she develops any neurological symptoms or gets any worse she will need to go to the ER instead of waiting until the MRI. Voiced understanding.
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
## 2016-02-12 NOTE — ED Notes (Addendum)
Patient sobbing in waiting room.  RN approached and asked what she could do to help.  Patient's husband states that patient has panic attacks and he is trying to calm her.  This RN asked her what was making her so anxious, pt states the pain in her head.  Husband then called PCP while RN was reassuring patient.  States PCP made an appt for MRI tomorrow, and patient will be leaving and following up with them.  This RN stated that we would like to be able to see and help her here, patient and husband refused and thanked Charity fundraiserN for kindness and support.

## 2016-02-13 ENCOUNTER — Other Ambulatory Visit: Payer: Self-pay

## 2016-02-13 ENCOUNTER — Telehealth: Payer: Self-pay | Admitting: Family Medicine

## 2016-02-13 ENCOUNTER — Ambulatory Visit
Admission: RE | Admit: 2016-02-13 | Discharge: 2016-02-13 | Disposition: A | Discharge status: Home / Self Care | Payer: 59 | Source: Ambulatory Visit | Attending: Family Medicine | Admitting: Family Medicine

## 2016-02-13 DIAGNOSIS — R51 Headache: Secondary | ICD-10-CM | POA: Insufficient documentation

## 2016-02-13 DIAGNOSIS — G44221 Chronic tension-type headache, intractable: Secondary | ICD-10-CM

## 2016-02-13 DIAGNOSIS — R519 Headache, unspecified: Secondary | ICD-10-CM

## 2016-02-13 MED ORDER — PROMETHAZINE HCL 12.5 MG PO TABS
12.5000 mg | ORAL_TABLET | Freq: Three times a day (TID) | ORAL | 0 refills | Status: DC | PRN
Start: 2016-02-13 — End: 2016-07-21
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
## 2016-02-14 ENCOUNTER — Encounter: Payer: Self-pay | Admitting: Emergency Medicine

## 2016-02-14 ENCOUNTER — Emergency Department: Payer: 59

## 2016-02-14 ENCOUNTER — Emergency Department
Admission: EM | Admit: 2016-02-14 | Discharge: 2016-02-14 | Disposition: A | Discharge status: Home / Self Care | Payer: 59 | Attending: Emergency Medicine | Admitting: Emergency Medicine

## 2016-02-14 DIAGNOSIS — Z79899 Other long term (current) drug therapy: Secondary | ICD-10-CM | POA: Insufficient documentation

## 2016-02-14 DIAGNOSIS — R51 Headache: Secondary | ICD-10-CM | POA: Insufficient documentation

## 2016-02-14 DIAGNOSIS — I10 Essential (primary) hypertension: Secondary | ICD-10-CM | POA: Diagnosis not present

## 2016-02-14 DIAGNOSIS — F419 Anxiety disorder, unspecified: Secondary | ICD-10-CM | POA: Insufficient documentation

## 2016-02-14 DIAGNOSIS — E039 Hypothyroidism, unspecified: Secondary | ICD-10-CM | POA: Diagnosis not present

## 2016-02-14 DIAGNOSIS — R569 Unspecified convulsions: Secondary | ICD-10-CM | POA: Insufficient documentation

## 2016-02-14 DIAGNOSIS — Z87891 Personal history of nicotine dependence: Secondary | ICD-10-CM | POA: Insufficient documentation

## 2016-02-14 DIAGNOSIS — F445 Conversion disorder with seizures or convulsions: Secondary | ICD-10-CM

## 2016-02-14 DIAGNOSIS — R519 Headache, unspecified: Secondary | ICD-10-CM

## 2016-02-14 LAB — CBC
HEMATOCRIT: 36.4 % (ref 35.0–47.0)
HEMOGLOBIN: 12.6 g/dL (ref 12.0–16.0)
MCH: 31 pg (ref 26.0–34.0)
MCHC: 34.7 g/dL (ref 32.0–36.0)
MCV: 89.1 fL (ref 80.0–100.0)
PLATELETS: 282 10*3/uL (ref 150–440)
RBC: 4.08 MIL/uL (ref 3.80–5.20)
RDW: 13.9 % (ref 11.5–14.5)
WBC: 8.6 10*3/uL (ref 3.6–11.0)

## 2016-02-14 LAB — BASIC METABOLIC PANEL
Anion gap: 8 (ref 5–15)
BUN: 21 mg/dL — ABNORMAL HIGH (ref 6–20)
CHLORIDE: 108 mmol/L (ref 101–111)
CO2: 26 mmol/L (ref 22–32)
CREATININE: 0.89 mg/dL (ref 0.44–1.00)
Calcium: 9.4 mg/dL (ref 8.9–10.3)
GFR calc non Af Amer: >60 mL/min (ref >60)
Glucose, Bld: 160 mg/dL — ABNORMAL HIGH (ref 65–99)
Potassium: 4.1 mmol/L (ref 3.5–5.1)
Sodium: 142 mmol/L (ref 135–145)

## 2016-02-14 MED ORDER — BUTALBITAL-APAP-CAFFEINE 50-325-40 MG PO TABS
2.0000 | ORAL_TABLET | Freq: Once | ORAL | Status: AC
  Administered 2016-02-14: 2 via ORAL
  Filled 2016-02-14: qty 2

## 2016-02-14 MED ORDER — BUTALBITAL-APAP-CAFFEINE 50-325-40 MG PO TABS: 1.0000 | ORAL_TABLET | Freq: Four times a day (QID) | ORAL | 0 refills | Status: DC | PRN

## 2016-02-14 NOTE — ED Notes (Signed)
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
## 2016-02-14 NOTE — ED Triage Notes (Signed)
Pt comes into the ED via POV c/o seizure that occurred last night.  According to her husband, the patient went rigid, not responsive to him, and "twitching" for 5-7 minutes.  Patient denies any issue with breathing after the seizure but states she had a hard time speaking to her husband afterwards.  Patient has no previous h/o seizures.  Patient in NAD at this time with even and unlabored respirations and neurologically intact.

## 2016-02-17 ENCOUNTER — Ambulatory Visit (INDEPENDENT_AMBULATORY_CARE_PROVIDER_SITE_OTHER): Payer: 59 | Admitting: Psychology

## 2016-02-17 DIAGNOSIS — F4310 Post-traumatic stress disorder, unspecified: Secondary | ICD-10-CM | POA: Diagnosis not present

## 2016-02-17 DIAGNOSIS — F449 Dissociative and conversion disorder, unspecified: Secondary | ICD-10-CM | POA: Diagnosis not present

## 2016-02-19 ENCOUNTER — Ambulatory Visit (INDEPENDENT_AMBULATORY_CARE_PROVIDER_SITE_OTHER): Payer: 59 | Admitting: Internal Medicine

## 2016-02-19 ENCOUNTER — Encounter: Payer: Self-pay | Admitting: Internal Medicine

## 2016-02-19 ENCOUNTER — Other Ambulatory Visit: Payer: Self-pay | Admitting: Internal Medicine

## 2016-02-19 VITALS — BP 128/98 | HR 104 | Resp 16 | Wt 259.0 lb

## 2016-02-19 DIAGNOSIS — M25569 Pain in unspecified knee: Secondary | ICD-10-CM

## 2016-02-19 DIAGNOSIS — E7800 Pure hypercholesterolemia, unspecified: Secondary | ICD-10-CM

## 2016-02-19 DIAGNOSIS — R404 Transient alteration of awareness: Secondary | ICD-10-CM

## 2016-02-19 DIAGNOSIS — E559 Vitamin D deficiency, unspecified: Secondary | ICD-10-CM

## 2016-02-19 DIAGNOSIS — K760 Fatty (change of) liver, not elsewhere classified: Secondary | ICD-10-CM

## 2016-02-19 DIAGNOSIS — F3130 Bipolar disorder, current episode depressed, mild or moderate severity, unspecified: Secondary | ICD-10-CM

## 2016-02-19 DIAGNOSIS — S134XXS Sprain of ligaments of cervical spine, sequela: Secondary | ICD-10-CM

## 2016-02-19 DIAGNOSIS — G44311 Acute post-traumatic headache, intractable: Secondary | ICD-10-CM

## 2016-02-19 DIAGNOSIS — R7303 Prediabetes: Secondary | ICD-10-CM | POA: Diagnosis not present

## 2016-02-19 LAB — HEMOGLOBIN A1C: Hgb A1c MFr Bld: 6.4 % (ref 4.6–6.5)

## 2016-02-19 LAB — HEPATIC FUNCTION PANEL
ALBUMIN: 4.3 g/dL (ref 3.5–5.2)
ALK PHOS: 47 U/L (ref 39–117)
ALT: 36 U/L — AB (ref 0–35)
AST: 21 U/L (ref 0–37)
Bilirubin, Direct: 0.1 mg/dL (ref 0.0–0.3)
TOTAL PROTEIN: 7.4 g/dL (ref 6.0–8.3)
Total Bilirubin: 0.3 mg/dL (ref 0.2–1.2)

## 2016-02-19 LAB — VITAMIN D 25 HYDROXY (VIT D DEFICIENCY, FRACTURES): VITD: 20.83 ng/mL — ABNORMAL LOW (ref 30.00–100.00)

## 2016-02-19 MED ORDER — DICLOFENAC SODIUM 3 % TD GEL: TRANSDERMAL | 1 refills | Status: DC

## 2016-02-19 MED ORDER — KETOROLAC TROMETHAMINE 60 MG/2ML IM SOLN
60.0000 mg | Freq: Once | INTRAMUSCULAR | Status: AC
  Administered 2016-02-19: 60 mg via INTRAMUSCULAR

## 2016-02-19 NOTE — Progress Notes (Signed)
Pre visit review using our clinic review tool, if applicable. No additional management support is needed unless otherwise documented below in the visit note. 
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
## 2016-02-19 NOTE — Patient Instructions (Addendum)
You received a toradol injection today ,  60 mg   I recommend wearing a soft cervical collar a few hours daily to give your neck muscles a rest  Massage is very helpful    Post-Concussion Syndrome Introduction Post-concussion syndrome is the symptoms that can occur after a head injury. These symptoms can last from weeks to months. Follow these instructions at home:  Take medicines only as told by your doctor.  Do not take aspirin.  Sleep with your head raised to help with headaches.  Avoid activities that can cause another head injury.  Do not play contact sports like football, hockey, soccer, or basketball.  Do not do other risky activities like downhill skiing, martial arts, or horseback riding until your doctor says it is okay.  Keep all follow-up visits as told by your doctor. This is important. Contact a doctor if:  You have a harder time:  Paying attention.  Focusing.  Remembering.  Learning new information.  Dealing with stress.  You need more time to complete tasks.  You are easily bothered (irritable).  You have more symptoms. Get help if you have any of these symptoms for more than two weeks after your injury:  Long-lasting (chronic) headaches.  Dizziness.  Trouble balancing.  Feeling sick to your stomach (nauseous).  Trouble with your vision.  Noise or light bothers you more.  Depression.  Mood swings.  Feeling worried (anxious).  Easily bothered.  Memory problems.  Trouble concentrating or paying attention.  Sleep problems.  Feeling tired all of the time. Get help right away if:  You feel confused.  You feel very sleepy.  You are hard to wake up.  You feel sick to your stomach.  You keep throwing up (vomiting).  You feel like you are moving when you are not (vertigo).  Your eyes move back and forth very quickly.  You start shaking (convulsing) or pass out (faint).  You have very bad headaches that do not get better  with medicine.  You cannot use your arms or legs like normal.  One of the black centers of your eyes (pupils) is bigger than the other.  You have clear or bloody fluid coming from your nose or ears.  Your problems get worse, not better. This information is not intended to replace advice given to you by your health care provider. Make sure you discuss any questions you have with your health care provider. Document Released: 01/28/2004 Document Revised: 05/28/2015 Document Reviewed: 03/27/2013  2017 Elsevier

## 2016-02-21 ENCOUNTER — Encounter: Payer: Self-pay | Admitting: Internal Medicine

## 2016-02-21 ENCOUNTER — Other Ambulatory Visit: Payer: Self-pay | Admitting: Internal Medicine

## 2016-02-21 MED ORDER — ERGOCALCIFEROL 1.25 MG (50000 UT) PO CAPS: 50000.0000 [IU] | ORAL_CAPSULE | ORAL | 2 refills | Status: DC

## 2016-02-21 NOTE — Assessment & Plan Note (Signed)
Aggravated by concurrent new onset  PTSD diagnosed by her  psychiatrist .

## 2016-02-21 NOTE — Assessment & Plan Note (Signed)
Neurology evaluation is pending for recent  event which occurred at home in the setting of PTSD, suggestive of a pseudoseizure vs new onset seizures of uncertain etiology
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
## 2016-02-24 ENCOUNTER — Ambulatory Visit (INDEPENDENT_AMBULATORY_CARE_PROVIDER_SITE_OTHER): Payer: 59 | Admitting: Psychology

## 2016-02-24 DIAGNOSIS — F4311 Post-traumatic stress disorder, acute: Secondary | ICD-10-CM | POA: Diagnosis not present

## 2016-02-24 DIAGNOSIS — F4481 Dissociative identity disorder: Secondary | ICD-10-CM

## 2016-02-29 ENCOUNTER — Ambulatory Visit: Payer: Self-pay | Admitting: Gastroenterology
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
## 2016-03-02 ENCOUNTER — Ambulatory Visit (INDEPENDENT_AMBULATORY_CARE_PROVIDER_SITE_OTHER): Payer: 59 | Admitting: Psychology

## 2016-03-02 ENCOUNTER — Telehealth: Payer: Self-pay | Admitting: *Deleted

## 2016-03-02 DIAGNOSIS — F4311 Post-traumatic stress disorder, acute: Secondary | ICD-10-CM | POA: Diagnosis not present

## 2016-03-02 DIAGNOSIS — F4481 Dissociative identity disorder: Secondary | ICD-10-CM | POA: Diagnosis not present

## 2016-03-02 NOTE — Telephone Encounter (Signed)
[facility] diclofenac 3% is denied. It can be approved if patient has a diagnosis of actinic keratosis. A letter of this decision has also been sent to the pt.

## 2016-03-02 NOTE — Telephone Encounter (Signed)
We were not using it for treatment of actinic keratosis so we will not be able to put that.  This was a refill of a topical pain reliever that had diclofenac as one of the ingredients.
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
## 2016-03-03 ENCOUNTER — Ambulatory Visit: Payer: Self-pay | Admitting: Neurology

## 2016-03-04 NOTE — Telephone Encounter (Signed)
Left mess for patient to call back.  

## 2016-03-07 NOTE — Telephone Encounter (Signed)
Mailed unread message to patient.  

## 2016-03-07 NOTE — Telephone Encounter (Signed)
Pt husband called back returning call. Husband states please call him @ [phone]. Thank you!

## 2016-03-08 ENCOUNTER — Encounter: Payer: Self-pay | Admitting: Neurology

## 2016-03-08 ENCOUNTER — Ambulatory Visit (INDEPENDENT_AMBULATORY_CARE_PROVIDER_SITE_OTHER): Payer: 59 | Admitting: Neurology

## 2016-03-08 VITALS — BP 138/84 | HR 105 | Ht 65.0 in | Wt 261.0 lb

## 2016-03-08 DIAGNOSIS — R232 Flushing: Secondary | ICD-10-CM | POA: Diagnosis not present

## 2016-03-08 DIAGNOSIS — F0781 Postconcussional syndrome: Secondary | ICD-10-CM | POA: Diagnosis not present

## 2016-03-08 DIAGNOSIS — F445 Conversion disorder with seizures or convulsions: Secondary | ICD-10-CM | POA: Diagnosis not present

## 2016-03-08 DIAGNOSIS — R413 Other amnesia: Secondary | ICD-10-CM

## 2016-03-08 MED ORDER — AMITRIPTYLINE HCL 25 MG PO TABS: 25.0000 mg | ORAL_TABLET | Freq: Every day | ORAL | 3 refills | Status: DC

## 2016-03-08 NOTE — Patient Instructions (Addendum)
1. Start Elavil 25 mg - one tablet at night.   2. We will contact Dr. Terrilee FilesZach Smith about the concussion clinic. They will contact you with an appt.

## 2016-03-09 ENCOUNTER — Ambulatory Visit: Payer: 59 | Admitting: Psychology

## 2016-03-09 ENCOUNTER — Telehealth: Payer: Self-pay | Admitting: Neurology

## 2016-03-09 NOTE — Addendum Note (Signed)
Addended bySilvio Pate: Sedonia Kitner L on: 03/09/2016 09:25 AM   Modules accepted: Orders

## 2016-03-09 NOTE — Telephone Encounter (Signed)
Pt's husband  informed of below.  

## 2016-03-09 NOTE — Telephone Encounter (Signed)
Dr. Terrilee FilesZach Frye is opening a post concussion clinic but it is not yet available for referrals.  [facility] treats post concussion and referral sent. They will contact patient to schedule.

## 2016-03-16 ENCOUNTER — Ambulatory Visit: Payer: 59 | Admitting: Psychology

## 2016-03-23 ENCOUNTER — Ambulatory Visit: Payer: 59 | Admitting: Psychology

## 2016-03-24 ENCOUNTER — Other Ambulatory Visit: Payer: Self-pay | Admitting: Internal Medicine

## 2016-03-24 DIAGNOSIS — N3281 Overactive bladder: Secondary | ICD-10-CM

## 2016-04-06 ENCOUNTER — Ambulatory Visit (INDEPENDENT_AMBULATORY_CARE_PROVIDER_SITE_OTHER): Payer: 59 | Admitting: Psychology

## 2016-04-06 DIAGNOSIS — F4311 Post-traumatic stress disorder, acute: Secondary | ICD-10-CM

## 2016-04-06 DIAGNOSIS — F4481 Dissociative identity disorder: Secondary | ICD-10-CM | POA: Diagnosis not present

## 2016-04-08 ENCOUNTER — Ambulatory Visit: Payer: Self-pay | Admitting: Internal Medicine

## 2016-04-20 ENCOUNTER — Telehealth: Payer: Self-pay | Admitting: Internal Medicine

## 2016-04-20 ENCOUNTER — Ambulatory Visit: Payer: 59 | Admitting: Psychology

## 2016-04-20 NOTE — Telephone Encounter (Signed)
Reason for call: right side pain  Symptoms: right pain upper quadrant pain, dull, sharp pain at times ,no fever , feels like she has sludge in gall bladder  Duration Sunday  Medications: Last seen for this problem: Seen by: Advised patient advised  to go to [facility] husband will take her

## 2016-04-20 NOTE — Telephone Encounter (Signed)
Pt called and wanted to know if we can do lab work for pancreatitis. He is having sharp pain/discomfort on her right side. Please advise, thank you!  Call pt @ [phone]

## 2016-04-21 ENCOUNTER — Ambulatory Visit (INDEPENDENT_AMBULATORY_CARE_PROVIDER_SITE_OTHER): Payer: 59 | Admitting: Psychology

## 2016-04-21 DIAGNOSIS — F4311 Post-traumatic stress disorder, acute: Secondary | ICD-10-CM

## 2016-04-24 ENCOUNTER — Other Ambulatory Visit: Payer: Self-pay | Admitting: Internal Medicine

## 2016-04-25 NOTE — Telephone Encounter (Signed)
Last refilled 12/2015, by Jason Coop, Please advise, thanks

## 2016-04-26 NOTE — Telephone Encounter (Signed)
Refilled, but she should not be taking both methocarbamaol and tizanidine AT THE SAME TIME. Both are muscle relaxers

## 2016-04-27 ENCOUNTER — Ambulatory Visit: Payer: 59 | Admitting: Psychology

## 2016-05-04 ENCOUNTER — Ambulatory Visit (INDEPENDENT_AMBULATORY_CARE_PROVIDER_SITE_OTHER): Payer: 59 | Admitting: Psychology

## 2016-05-04 DIAGNOSIS — F4311 Post-traumatic stress disorder, acute: Secondary | ICD-10-CM

## 2016-05-04 DIAGNOSIS — F4481 Dissociative identity disorder: Secondary | ICD-10-CM

## 2016-05-11 ENCOUNTER — Ambulatory Visit: Payer: 59 | Admitting: Psychology

## 2016-05-17 ENCOUNTER — Other Ambulatory Visit: Payer: Self-pay | Admitting: Internal Medicine

## 2016-05-18 ENCOUNTER — Ambulatory Visit (INDEPENDENT_AMBULATORY_CARE_PROVIDER_SITE_OTHER): Payer: 59 | Admitting: Psychology

## 2016-05-18 DIAGNOSIS — F4481 Dissociative identity disorder: Secondary | ICD-10-CM

## 2016-05-18 DIAGNOSIS — F4311 Post-traumatic stress disorder, acute: Secondary | ICD-10-CM | POA: Diagnosis not present

## 2016-05-25 ENCOUNTER — Ambulatory Visit: Payer: Self-pay | Admitting: Family

## 2016-05-25 ENCOUNTER — Ambulatory Visit: Payer: 59 | Admitting: Psychology

## 2016-05-26 ENCOUNTER — Encounter: Payer: Self-pay | Admitting: Family

## 2016-05-26 ENCOUNTER — Ambulatory Visit (INDEPENDENT_AMBULATORY_CARE_PROVIDER_SITE_OTHER): Payer: 59 | Admitting: Family

## 2016-05-26 ENCOUNTER — Telehealth: Payer: Self-pay | Admitting: *Deleted

## 2016-05-26 VITALS — BP 130/80 | HR 110 | Temp 96.0°F | Resp 18 | Wt 262.4 lb

## 2016-05-26 DIAGNOSIS — Z8719 Personal history of other diseases of the digestive system: Secondary | ICD-10-CM

## 2016-05-26 DIAGNOSIS — R300 Dysuria: Secondary | ICD-10-CM

## 2016-05-26 LAB — LIPASE: LIPASE: 26 U/L (ref 11.0–59.0)

## 2016-05-26 LAB — POCT URINALYSIS DIPSTICK
BILIRUBIN UA: NEGATIVE
GLUCOSE UA: NEGATIVE
KETONES UA: NEGATIVE
Nitrite, UA: NEGATIVE
Protein, UA: NEGATIVE
SPEC GRAV UA: 1.01 (ref 1.010–1.025)
Urobilinogen, UA: 0.2 E.U./dL
pH, UA: 5.5 (ref 5.0–8.0)

## 2016-05-26 LAB — AMYLASE: AMYLASE: 22 U/L — AB (ref 27–131)

## 2016-05-26 LAB — URINALYSIS, MICROSCOPIC ONLY: RBC / HPF: NONE SEEN (ref 0–?)

## 2016-05-26 NOTE — Progress Notes (Signed)
Subjective:    Patient ID: Renee [name], female    DOB: 05/27/1955, 61 y.o.   MRN: [id number]  CC: Renee [name] is a 61 y.o. female who presents today for an acute visit.    HPI: Chief complaint suprapubic pain, resolved Was seen 2 weeks ago in urgent care for lower abdominal pain,  and was treated with Cipro. Would Like her urine rechecked.  No fever, nausea, vomiting, hematuria, flank pain  H/o pancreatitis.  No abdominal pain, nausea, vomiting. Due to history hospitalization, patient would like amylase, lipase checked.     HISTORY:  Past Medical History:  Diagnosis Date  . Anxiety   . Arthritis   . Atopic dermatitis    scalp  . Depression   . Hyperlipidemia   . Hypertension    pt denies  . Thyroid disease   . Tobacco abuse    Past Surgical History:  Procedure Laterality Date  . LUMBAR DISC SURGERY    . ROTATOR CUFF REPAIR Bilateral    Family History  Problem Relation Age of Onset  . Arthritis Mother   . Macular degeneration Mother   . Heart disease Father     Allergies: Ancef [cefazolin sodium]; Penicillins; Clarithromycin; Eggs or egg-derived products; and Seroquel [quetiapine fumarate] Current Outpatient Prescriptions on File Prior to Visit  Medication Sig Dispense Refill  . amitriptyline (ELAVIL) 25 MG tablet Take 1 tablet (25 mg total) by mouth at bedtime. 30 tablet 3  . clonazePAM (KLONOPIN) 0.5 MG tablet Take 0.5 mg by mouth 2 (two) times daily.    . Diclofenac Sodium 3 % GEL Diclofenac,baclofen, cyclobenzaprine, lidocaine gel to affected area twice daily 100 g 1  . ergocalciferol (VITAMIN D2) 50000 units capsule Take 1 capsule (50,000 Units total) by mouth once a week. 4 capsule 2  . famotidine (PEPCID) 20 MG tablet Take 1 tablet (20 mg total) by mouth daily. With breakfast 90 tablet 3  . gabapentin (NEURONTIN) 100 MG capsule Take 100 mg by mouth 2 (two) times daily.     [name]. levothyroxine (SYNTHROID, LEVOTHROID) 88 MCG tablet Take 1 tablet (88 mcg total)  by mouth daily. 90 tablet 1  . lurasidone (LATUDA) 20 MG TABS tablet Take by mouth daily.    [name]. oxybutynin (DITROPAN) 5 MG tablet TAKE 1/2 OF A TABLET BY MOUTH TWICE A DAY 30 tablet 0  . promethazine (PHENERGAN) 12.5 MG tablet Take 1 tablet (12.5 mg total) by mouth every 8 (eight) hours as needed for nausea or vomiting (and headache). 20 tablet 0  . ropinirole (REQUIP) 5 MG tablet Take 5 mg by mouth at bedtime.  0  . tizanidine (ZANAFLEX) 6 MG capsule     . tizanidine (ZANAFLEX) 6 MG capsule TAKE 1 CAPSULE THREE TIMES A DAY AS NEEDED FOR MUSCLE SPASMS 60 capsule 2  . VENTOLIN HFA 108 (90 Base) MCG/ACT inhaler INHALE 2 PUFFS INTO THE LUNGS EVERY 6 (SIX) HOURS AS NEEDED. 18 Inhaler 2  . Vilazodone HCl (VIIBRYD) 40 MG TABS Take by mouth daily.    . methocarbamol (ROBAXIN) 500 MG tablet TAKE 1 TABLET BY MOUTH 3 TIMES A DAY FOR 7 DAYS  0   No current facility-administered medications on file prior to visit.     Social History  Substance Use Topics  . Smoking status: Former Smoker    Packs/day: 0.25    Types: Cigarettes    Quit date: 01/02/2016  . Smokeless tobacco: Never Used  . Alcohol use Yes     Comment:  once a week     Review of Systems  Constitutional: Negative for chills and fever.  Respiratory: Negative for cough.   Cardiovascular: Negative for chest pain and palpitations.  Gastrointestinal: Negative for abdominal distention, abdominal pain, nausea and vomiting.  Genitourinary: Negative for dysuria, flank pain and frequency.      Objective:    BP 130/80 (BP Location: Right Arm, Patient Position: Sitting, Cuff Size: Large)   Pulse (!) 110   Temp (!) 96 F (35.6 C) (Oral)   Resp 18   Wt 262 lb 6 oz (119 kg)   SpO2 96%   BMI 43.66 kg/m    Physical Exam  Constitutional: She appears well-developed and well-nourished.  Eyes: Conjunctivae are normal.  Cardiovascular: Normal rate, regular rhythm, normal heart sounds and normal pulses.   Pulmonary/Chest: Effort normal and  breath sounds normal. She has no wheezes. She has no rhonchi. She has no rales.  Abdominal: Soft. Normal appearance and bowel sounds are normal. She exhibits no distension, no fluid wave, no ascites and no mass. There is no tenderness. There is no rigidity, no rebound, no guarding and no CVA tenderness.  Neurological: She is alert.  Skin: Skin is warm and dry.  Psychiatric: She has a normal mood and affect. Her speech is normal and behavior is normal. Thought content normal.  Vitals reviewed.      Assessment & Plan:   1. Dysuria Afebrile. Urine dipstick shows trace blood, bacteria. Advised patient we will await urine culture to ensure infection has cleared. Advised her to follow-up regarding trace blood in the next couple months to ensure that has resolved as well. - POCT Urinalysis Dipstick - Urine Culture - Urine Microscopic Only  2. History of acute pancreatitis Suprapubic abdominal pain has resolved. Reassured by benign abdominal exam. Pending labs - Lipase - Amylase    I am having Ms. Vanderpool maintain her famotidine, Vilazodone HCl, gabapentin, lurasidone, VENTOLIN HFA, tizanidine, levothyroxine, methocarbamol, ropinirole, promethazine, Diclofenac Sodium, ergocalciferol, clonazePAM, amitriptyline, oxybutynin, and tizanidine.   No orders of the defined types were placed in this encounter.   Return precautions given.   Risks, benefits, and alternatives of the medications and treatment plan prescribed today were discussed, and patient expressed understanding.   Education regarding symptom management and diagnosis given to patient on AVS.  Continue to follow with Sherlene Shams, MD for routine health maintenance.   [name] and I agreed with plan.   [name], FNP

## 2016-05-26 NOTE — Patient Instructions (Signed)
Labs today  As discussed,  expected after urinary tract infection to have trace amounts of blood due to the irritation of the bladder. However we would expect this to resolve.   Please ensure you come back in the next couple of months to have this checked with either myself or Dr. Darrick Huntsmanullo to ensure the blood has resolved  [name] of water  Pleasure seeing you

## 2016-05-27 NOTE — Telephone Encounter (Signed)
no Ua needed at this time.

## 2016-05-28 LAB — URINE CULTURE: ORGANISM ID, BACTERIA: NO GROWTH

## 2016-06-08 ENCOUNTER — Ambulatory Visit (INDEPENDENT_AMBULATORY_CARE_PROVIDER_SITE_OTHER): Payer: 59 | Admitting: Psychology

## 2016-06-08 DIAGNOSIS — F4481 Dissociative identity disorder: Secondary | ICD-10-CM | POA: Diagnosis not present

## 2016-06-08 DIAGNOSIS — F4311 Post-traumatic stress disorder, acute: Secondary | ICD-10-CM | POA: Diagnosis not present

## 2016-06-14 ENCOUNTER — Other Ambulatory Visit: Payer: Self-pay | Admitting: Internal Medicine

## 2016-06-15 ENCOUNTER — Ambulatory Visit (INDEPENDENT_AMBULATORY_CARE_PROVIDER_SITE_OTHER): Payer: 59 | Admitting: Psychology

## 2016-06-15 DIAGNOSIS — F4311 Post-traumatic stress disorder, acute: Secondary | ICD-10-CM

## 2016-06-15 DIAGNOSIS — F4481 Dissociative identity disorder: Secondary | ICD-10-CM

## 2016-06-16 ENCOUNTER — Other Ambulatory Visit: Payer: Self-pay | Admitting: Internal Medicine

## 2016-06-16 DIAGNOSIS — N3281 Overactive bladder: Secondary | ICD-10-CM

## 2016-06-16 NOTE — Telephone Encounter (Signed)
Last OV was an acute with NP on 05/26/16, last with PCP was 02/19/16, last fill was 03/24/16 #30 with no refills.  Please advise, thanks

## 2016-06-22 ENCOUNTER — Ambulatory Visit (INDEPENDENT_AMBULATORY_CARE_PROVIDER_SITE_OTHER): Payer: 59 | Admitting: Psychology

## 2016-06-22 DIAGNOSIS — F4481 Dissociative identity disorder: Secondary | ICD-10-CM | POA: Diagnosis not present

## 2016-06-22 DIAGNOSIS — F4311 Post-traumatic stress disorder, acute: Secondary | ICD-10-CM

## 2016-06-29 ENCOUNTER — Ambulatory Visit (INDEPENDENT_AMBULATORY_CARE_PROVIDER_SITE_OTHER): Payer: 59 | Admitting: Psychology

## 2016-06-29 DIAGNOSIS — F4311 Post-traumatic stress disorder, acute: Secondary | ICD-10-CM | POA: Diagnosis not present

## 2016-06-29 DIAGNOSIS — F4481 Dissociative identity disorder: Secondary | ICD-10-CM | POA: Diagnosis not present

## 2016-07-01 ENCOUNTER — Other Ambulatory Visit: Payer: Self-pay | Admitting: Neurology

## 2016-07-02 ENCOUNTER — Other Ambulatory Visit: Payer: Self-pay | Admitting: Internal Medicine

## 2016-07-13 ENCOUNTER — Ambulatory Visit: Payer: 59 | Admitting: Psychology

## 2016-07-20 ENCOUNTER — Ambulatory Visit: Payer: 59 | Admitting: Psychology

## 2016-07-21 ENCOUNTER — Ambulatory Visit (INDEPENDENT_AMBULATORY_CARE_PROVIDER_SITE_OTHER): Payer: 59 | Admitting: Family

## 2016-07-21 ENCOUNTER — Encounter: Payer: Self-pay | Admitting: Family

## 2016-07-21 VITALS — BP 136/88 | HR 90 | Temp 98.4°F | Ht 65.0 in

## 2016-07-21 DIAGNOSIS — J0100 Acute maxillary sinusitis, unspecified: Secondary | ICD-10-CM | POA: Diagnosis not present

## 2016-07-21 MED ORDER — DOXYCYCLINE HYCLATE 100 MG PO TABS: 100.0000 mg | ORAL_TABLET | Freq: Two times a day (BID) | ORAL | 0 refills | Status: DC

## 2016-07-21 NOTE — Patient Instructions (Addendum)
Start doxycycline  Ensure to take probiotics while on antibiotics and also for 2 weeks after completion. It is important to re-colonize the gut with good bacteria and also to prevent any diarrheal infections associated with antibiotic use.   Stop sudafed as elevating your pulse/blood pressure  Plenty of water   Let us know if not better

## 2016-07-21 NOTE — Progress Notes (Signed)
Subjective:    Patient ID: Renee RotaLinda Candy, female    DOB: 07/30/1955, 61 y.o.   MRN: [id number]  CC: Renee RotaLinda Crable is a 61 y.o. female who presents today for an acute visit.    HPI: CC: sinus pressure x one week, worsening.  Endorses right sinus pressure, right sided HA, cough, swollen glands, nasal congestion, tactile warmth. No jaw pain, trouble eating.   No new vision changes. currently doing PT for post concussion syndrome.   On sudafed, ibuprofen with some relief.     HISTORY:  Past Medical History:  Diagnosis Date  . Anxiety   . Arthritis   . Atopic dermatitis    scalp  . Depression   . Hyperlipidemia   . Hypertension    pt denies  . Thyroid disease   . Tobacco abuse    Past Surgical History:  Procedure Laterality Date  . LUMBAR DISC SURGERY    . ROTATOR CUFF REPAIR Bilateral    Family History  Problem Relation Age of Onset  . Arthritis Mother   . Macular degeneration Mother   . Heart disease Father     Allergies: Ancef [cefazolin sodium]; Penicillins; Clarithromycin; Eggs or egg-derived products; and Seroquel [quetiapine fumarate] Current Outpatient Prescriptions on File Prior to Visit  Medication Sig Dispense Refill  . amitriptyline (ELAVIL) 25 MG tablet TAKE 1 TABLET BY MOUTH EVERY DAY AT BEDTIME 30 tablet 3  . clonazePAM (KLONOPIN) 0.5 MG tablet Take 0.5 mg by mouth 2 (two) times daily.    . Diclofenac Sodium 3 % GEL Diclofenac,baclofen, cyclobenzaprine, lidocaine gel to affected area twice daily 100 g 1  . ergocalciferol (VITAMIN D2) 50000 units capsule Take 1 capsule (50,000 Units total) by mouth once a week. 4 capsule 2  . famotidine (PEPCID) 20 MG tablet Take 1 tablet (20 mg total) by mouth daily. With breakfast 90 tablet 3  . gabapentin (NEURONTIN) 100 MG capsule Take 100 mg by mouth 2 (two) times daily.     [name]. levothyroxine (SYNTHROID, LEVOTHROID) 88 MCG tablet Take 1 tablet (88 mcg total) by mouth daily. 90 tablet 1  . levothyroxine (SYNTHROID,  LEVOTHROID) 88 MCG tablet TAKE 1 TABLET (88 MCG TOTAL) BY MOUTH DAILY. 90 tablet 1  . lurasidone (LATUDA) 20 MG TABS tablet Take by mouth daily.    . methocarbamol (ROBAXIN) 500 MG tablet TAKE 1 TABLET BY MOUTH 3 TIMES A DAY FOR 7 DAYS  0  . oxybutynin (DITROPAN) 5 MG tablet TAKE 1/2 OF A TABLET BY MOUTH TWICE A DAY 30 tablet 5  . promethazine (PHENERGAN) 12.5 MG tablet Take 1 tablet (12.5 mg total) by mouth every 8 (eight) hours as needed for nausea or vomiting (and headache). 20 tablet 0  . ropinirole (REQUIP) 5 MG tablet Take 5 mg by mouth at bedtime.  0  . tizanidine (ZANAFLEX) 6 MG capsule     . tizanidine (ZANAFLEX) 6 MG capsule TAKE 1 CAPSULE BY MOUTH THREE TIMES A DAY AS NEEDED FOR MUSCLE SPASMS 60 capsule 2  . VENTOLIN HFA 108 (90 Base) MCG/ACT inhaler INHALE 2 PUFFS INTO THE LUNGS EVERY 6 (SIX) HOURS AS NEEDED. 18 Inhaler 2  . Vilazodone HCl (VIIBRYD) 40 MG TABS Take by mouth daily.     No current facility-administered medications on file prior to visit.     Social History  Substance Use Topics  . Smoking status: Former Smoker    Packs/day: 0.25    Types: Cigarettes    Quit date: 01/02/2016  .  Smokeless tobacco: Never Used  . Alcohol use Yes     Comment: once a week     Review of Systems  Constitutional: Negative for chills and fever.  HENT: Positive for congestion, ear pain, sinus pain and sinus pressure. Negative for ear discharge, sore throat, trouble swallowing and voice change.   Eyes: Negative for visual disturbance.  Respiratory: Positive for cough. Negative for shortness of breath and wheezing.   Cardiovascular: Negative for chest pain and palpitations.  Gastrointestinal: Negative for nausea and vomiting.  Neurological: Positive for headaches.      Objective:    BP 136/88   Pulse 90   Temp 98.4 F (36.9 C) (Oral)   Ht 5\' 5"  (1.651 m)   SpO2 96%    Physical Exam  Constitutional: She appears well-developed and well-nourished.  HENT:  Head: Normocephalic  and atraumatic.  Right Ear: Hearing, tympanic membrane, external ear and ear canal normal. No drainage, swelling or tenderness. No foreign bodies. Tympanic membrane is not erythematous and not bulging. No middle ear effusion. No decreased hearing is noted.  Left Ear: Hearing, tympanic membrane, external ear and ear canal normal. No drainage, swelling or tenderness. No foreign bodies. Tympanic membrane is not erythematous and not bulging.  No middle ear effusion. No decreased hearing is noted.  Nose: No rhinorrhea. Right sinus exhibits maxillary sinus tenderness. Right sinus exhibits no frontal sinus tenderness. Left sinus exhibits no maxillary sinus tenderness and no frontal sinus tenderness.  Mouth/Throat: Uvula is midline, oropharynx is clear and moist and mucous membranes are normal. No oropharyngeal exudate, posterior oropharyngeal edema, posterior oropharyngeal erythema or tonsillar abscesses.  Eyes: Conjunctivae are normal.  Cardiovascular: Regular rhythm, normal heart sounds and normal pulses.   Pulmonary/Chest: Effort normal and breath sounds normal. She has no wheezes. She has no rhonchi. She has no rales.  Lymphadenopathy:       Head (right side): No submental, no submandibular, no tonsillar, no preauricular, no posterior auricular and no occipital adenopathy present.       Head (left side): No submental, no submandibular, no tonsillar, no preauricular, no posterior auricular and no occipital adenopathy present.    She has no cervical adenopathy.  Neurological: She is alert.  Skin: Skin is warm and dry.  Psychiatric: She has a normal mood and affect. Her speech is normal and behavior is normal. Thought content normal.  Vitals reviewed.      Assessment & Plan:   1. Acute non-recurrent maxillary sinusitis Afebrile. Patient is well appearing. Based on duration of symptoms and worsening, we jointly agreed to go ahead and start antibiotic therapy. Advised to stop Sudafed. Encouraged plenty  of water. Return precautions given.  - doxycycline (VIBRA-TABS) 100 MG tablet; Take 1 tablet (100 mg total) by mouth 2 (two) times daily.  Dispense: 14 tablet; Refill: 0    I am having Ms. Vogt start on doxycycline. I am also having her maintain her famotidine, Vilazodone HCl, gabapentin, lurasidone, VENTOLIN HFA, tizanidine, levothyroxine, methocarbamol, ropinirole, promethazine, Diclofenac Sodium, ergocalciferol, clonazePAM, levothyroxine, oxybutynin, amitriptyline, and tizanidine.   Meds ordered this encounter  Medications  . doxycycline (VIBRA-TABS) 100 MG tablet    Sig: Take 1 tablet (100 mg total) by mouth 2 (two) times daily.    Dispense:  14 tablet    Refill:  0    Order Specific Question:   Supervising Provider    Answer:   Sherlene Shams [[id number]]    Return precautions given.   Risks, benefits, and alternatives of  the medications and treatment plan prescribed today were discussed, and patient expressed understanding.   Education regarding symptom management and diagnosis given to patient on AVS.  Continue to follow with Sherlene Shams, MD for routine health maintenance.   [name] and I agreed with plan.   [name], FNP

## 2016-07-21 NOTE — Progress Notes (Signed)
Pre visit review using our clinic review tool, if applicable. No additional management support is needed unless otherwise documented below in the visit note. 

## 2016-07-27 ENCOUNTER — Ambulatory Visit: Payer: 59 | Admitting: Psychology

## 2016-08-14 ENCOUNTER — Other Ambulatory Visit: Payer: Self-pay | Admitting: Internal Medicine

## 2016-08-17 ENCOUNTER — Ambulatory Visit (INDEPENDENT_AMBULATORY_CARE_PROVIDER_SITE_OTHER): Payer: 59 | Admitting: Psychology

## 2016-08-17 DIAGNOSIS — F4311 Post-traumatic stress disorder, acute: Secondary | ICD-10-CM | POA: Diagnosis not present

## 2016-08-17 DIAGNOSIS — F4481 Dissociative identity disorder: Secondary | ICD-10-CM

## 2016-08-24 ENCOUNTER — Ambulatory Visit (INDEPENDENT_AMBULATORY_CARE_PROVIDER_SITE_OTHER): Payer: 59 | Admitting: Psychology

## 2016-08-24 DIAGNOSIS — F4311 Post-traumatic stress disorder, acute: Secondary | ICD-10-CM

## 2016-08-24 DIAGNOSIS — F4481 Dissociative identity disorder: Secondary | ICD-10-CM | POA: Diagnosis not present

## 2016-08-31 ENCOUNTER — Ambulatory Visit (INDEPENDENT_AMBULATORY_CARE_PROVIDER_SITE_OTHER): Payer: 59 | Admitting: Psychology

## 2016-08-31 ENCOUNTER — Telehealth: Payer: Self-pay | Admitting: Internal Medicine

## 2016-08-31 DIAGNOSIS — F4481 Dissociative identity disorder: Secondary | ICD-10-CM

## 2016-08-31 DIAGNOSIS — F4311 Post-traumatic stress disorder, acute: Secondary | ICD-10-CM | POA: Diagnosis not present

## 2016-08-31 MED ORDER — GABAPENTIN 100 MG PO CAPS: 300.0000 mg | ORAL_CAPSULE | Freq: Three times a day (TID) | ORAL | 1 refills | Status: DC

## 2016-08-31 NOTE — Telephone Encounter (Signed)
Pt called and stated that she has neuropathy in both feet making it diifcult to walk. Pt states that it has been on an off for 3 weeks. She says within the last couple of days they are worse. Please advise, thank you!  Call pt @ [phone]

## 2016-08-31 NOTE — Telephone Encounter (Signed)
C/o neuropathy in bilateral feet and starting to go up into legs. She says they are feeling like they are burning. No new meds. Symptoms have been intermittent for the last 3 weeks but has worsened in the last 3 days and has not went away. Pt states left foot appears to be swollen. Denies any redness or warmth to the touch. States she has been feeling tired and not feeling well but has not checked for fever. Patient is currently taking gabapentin TID 100mg  morning and lunch and 300 mg at night. Please advise.

## 2016-08-31 NOTE — Telephone Encounter (Signed)
Patient is aware and stated she would call back if symptoms persist.

## 2016-08-31 NOTE — Telephone Encounter (Signed)
She can increase the gabapentin to 200 mg in the am and at lunch,  And continue increasing until she is at 300 mg tid. Medication refilled.  Edema is a s/e of the medication .  Elevate legs,  Wear compression stockings if tolerated.   .[name]

## 2016-09-02 ENCOUNTER — Other Ambulatory Visit: Payer: Self-pay | Admitting: Internal Medicine

## 2016-09-08 ENCOUNTER — Ambulatory Visit (INDEPENDENT_AMBULATORY_CARE_PROVIDER_SITE_OTHER): Payer: 59 | Admitting: Psychology

## 2016-09-08 DIAGNOSIS — F4311 Post-traumatic stress disorder, acute: Secondary | ICD-10-CM

## 2016-09-08 DIAGNOSIS — F449 Dissociative and conversion disorder, unspecified: Secondary | ICD-10-CM

## 2016-09-14 ENCOUNTER — Ambulatory Visit: Payer: Self-pay | Admitting: Psychology

## 2016-09-15 ENCOUNTER — Ambulatory Visit (INDEPENDENT_AMBULATORY_CARE_PROVIDER_SITE_OTHER): Payer: 59 | Admitting: Family

## 2016-09-15 ENCOUNTER — Encounter: Payer: Self-pay | Admitting: Family

## 2016-09-15 ENCOUNTER — Ambulatory Visit (INDEPENDENT_AMBULATORY_CARE_PROVIDER_SITE_OTHER): Payer: 59

## 2016-09-15 VITALS — BP 132/86 | HR 112 | Temp 98.6°F | Ht 65.0 in

## 2016-09-15 DIAGNOSIS — M1990 Unspecified osteoarthritis, unspecified site: Secondary | ICD-10-CM

## 2016-09-15 LAB — SEDIMENTATION RATE: Sed Rate: 52 mm/hr — ABNORMAL HIGH (ref 0–30)

## 2016-09-15 NOTE — Progress Notes (Signed)
Subjective:    Patient ID: Renee [name], female    DOB: 11/30/1955, 61 y.o.   MRN: [id number]  CC: [name] is a 61 y.o. female who presents today for an acute visit.    HPI: CC: arthralgia in joints of bilateral hands, started one year ago, coming and going; had gotten worse one 3 days ago, better today. No pain in wrists.  No joint swelling, erythema.   Gabapentin, CBD oil helps. Some chills yesterday 'little bit',  No fever, N, V, HA, sinus pressure, ear pain. Grandmother has RA. No SLE, DM I.   No tick bites.   Feels 'flu like' however 'doesn't think it is the flu.' Had a sore throat which has improved.  Also complains of bilateral knee pain, always had in right knee, however left knee had bothered her recently  .  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         HISTORY:  Past Medical History:  Diagnosis Date  . Anxiety   . Arthritis   . Atopic dermatitis    scalp  . Depression   . Hyperlipidemia   . Hypertension    pt denies  . Thyroid disease   . Tobacco abuse    Past Surgical History:  Procedure Laterality Date  . LUMBAR DISC SURGERY    . ROTATOR CUFF REPAIR Bilateral    Family History  Problem Relation Age of Onset  . Arthritis Mother   . Macular degeneration Mother   . Heart disease Father     Allergies: Ancef [cefazolin sodium]; Penicillins; Clarithromycin; Eggs or egg-derived products; and Seroquel [quetiapine fumarate] Current Outpatient Prescriptions on File Prior to Visit  Medication Sig Dispense Refill  . amitriptyline (ELAVIL) 25 MG tablet TAKE 1 TABLET BY MOUTH EVERY DAY AT BEDTIME 30 tablet 3  . clonazePAM (KLONOPIN) 0.5 MG tablet Take 0.5 mg by mouth 2 (two) times daily.    [name]. doxycycline (VIBRA-TABS) 100 MG tablet Take 1 tablet (100 mg total) by mouth 2 (two) times daily. 14 tablet 0  . ergocalciferol (VITAMIN D2) 50000 units capsule Take 1 capsule (50,000  Units total) by mouth once a week. 4 capsule 2  . famotidine (PEPCID) 20 MG tablet TAKE 1 TABLET (20 MG TOTAL) BY MOUTH DAILY. WITH BREAKFAST 90 tablet 3  . gabapentin (NEURONTIN) 100 MG capsule Take 3 capsules (300 mg total) by mouth 3 (three) times daily. 270 capsule 1  . levothyroxine (SYNTHROID, LEVOTHROID) 88 MCG tablet Take 1 tablet (88 mcg total) by mouth daily. 90 tablet 1  . levothyroxine (SYNTHROID, LEVOTHROID) 88 MCG tablet TAKE 1 TABLET (88 MCG TOTAL) BY MOUTH DAILY. 90 tablet 1  . lurasidone (LATUDA) 20 MG TABS tablet Take by mouth daily.    . methocarbamol (ROBAXIN) 500 MG tablet TAKE 1 TABLET BY MOUTH 3 TIMES A DAY FOR 7 DAYS  0  . oxybutynin (DITROPAN) 5 MG tablet TAKE 1/2 OF A TABLET BY MOUTH TWICE A DAY 30 tablet 5  . ropinirole (REQUIP) 5 MG tablet Take 5 mg by mouth at bedtime.  0  . tizanidine (ZANAFLEX) 6 MG capsule     . tizanidine (ZANAFLEX) 6 MG capsule TAKE 1 CAPSULE BY MOUTH THREE TIMES A DAY AS NEEDED FOR MUSCLE SPASMS 60 capsule 2  . VENTOLIN HFA 108 (90 Base) MCG/ACT inhaler INHALE 2 PUFFS INTO THE LUNGS EVERY 6 (SIX) HOURS AS NEEDED. 18  Inhaler 2  . Vilazodone HCl (VIIBRYD) 40 MG TABS Take by mouth daily.     No current facility-administered medications on file prior to visit.     Social History  Substance Use Topics  . Smoking status: Former Smoker    Packs/day: 0.25    Types: Cigarettes    Quit date: 01/02/2016  . Smokeless tobacco: Never Used  . Alcohol use Yes     Comment: once a week     Review of Systems  Constitutional: Positive for chills. Negative for fever.  HENT: Negative for congestion, ear pain and sore throat.   Respiratory: Negative for cough.   Cardiovascular: Negative for chest pain and palpitations.  Gastrointestinal: Negative for nausea and vomiting.  Musculoskeletal: Positive for arthralgias.  Skin: Negative for color change, rash and wound.  Neurological: Negative for dizziness, weakness and headaches.      Objective:    BP  132/86   Pulse (!) 112   Temp 98.6 F (37 C) (Oral)   Ht  (1.651 m)   SpO2 98%    Physical Exam  Constitutional: She appears well-developed and well-nourished.  HENT:  Head: Normocephalic and atraumatic.  Right Ear: Hearing, tympanic membrane, external ear and ear canal normal. No drainage, swelling or tenderness. No foreign bodies. Tympanic membrane is not erythematous and not bulging. No middle ear effusion. No decreased hearing is noted.  Left Ear: Hearing, tympanic membrane, external ear and ear canal normal. No drainage, swelling or tenderness. No foreign bodies. Tympanic membrane is not erythematous and not bulging.  No middle ear effusion. No decreased hearing is noted.  Nose: Nose normal. No rhinorrhea. Right sinus exhibits no maxillary sinus tenderness and no frontal sinus tenderness. Left sinus exhibits no maxillary sinus tenderness and no frontal sinus tenderness.  Mouth/Throat: Uvula is midline, oropharynx is clear and moist and mucous membranes are normal. No oropharyngeal exudate, posterior oropharyngeal edema, posterior oropharyngeal erythema or tonsillar abscesses.  Eyes: Conjunctivae are normal.  Cardiovascular: Regular rhythm, normal heart sounds and normal pulses.   Pulmonary/Chest: Effort normal and breath sounds normal. She has no wheezes. She has no rhonchi. She has no rales.  Musculoskeletal:       Right wrist: Normal. She exhibits normal range of motion, no tenderness and no bony tenderness.       Left wrist: Normal. She exhibits normal range of motion, no tenderness and no bony tenderness.       Right hand: Normal. She exhibits normal range of motion, no tenderness, normal capillary refill and no swelling. Normal sensation noted. Normal strength noted.       Left hand: Normal. She exhibits normal range of motion, no tenderness and normal capillary refill. Normal sensation noted. Normal strength noted.  Lymphadenopathy:       Head (right side): No submental, no  submandibular, no tonsillar, no preauricular, no posterior auricular and no occipital adenopathy present.       Head (left side): No submental, no submandibular, no tonsillar, no preauricular, no posterior auricular and no occipital adenopathy present.    She has no cervical adenopathy.  Neurological: She is alert.  Skin: Skin is warm and dry.  Psychiatric: She has a normal mood and affect. Her speech is normal and behavior is normal. Thought content normal.  Vitals reviewed.      Assessment & Plan:   Problem List Items Addressed This Visit      Musculoskeletal and Integument   Arthritis - Primary    Patient's pain  is reported over DIP, PIP, MCP, quite diffuse. No appreciable tenderness, swelling, bogginess, erythema over PIP, MCPs, or wrists during exam today. Pending evaluation for osteoarthritis v RA. No uri features and negative influenza.  Follow up in one month.       Relevant Orders   DG Hand Complete Left   DG Hand Complete Right   C-reactive protein   CYCLIC CITRUL PEPTIDE ANTIBODY, IGG/IGA   Sedimentation rate   Antinuclear Antib (ANA)   Rheumatoid Factor          I am having Ms. Yasuda maintain her Vilazodone HCl, lurasidone, VENTOLIN HFA, tizanidine, levothyroxine, methocarbamol, ropinirole, ergocalciferol, clonazePAM, levothyroxine, oxybutynin, amitriptyline, doxycycline, famotidine, gabapentin, and tizanidine.   No orders of the defined types were placed in this encounter.   Return precautions given.   Risks, benefits, and alternatives of the medications and treatment plan prescribed today were discussed, and patient expressed understanding.   Education regarding symptom management and diagnosis given to patient on AVS.  Continue to follow with Sherlene Shams, MD for routine health maintenance.   [name] and I agreed with plan.   [name], FNP

## 2016-09-15 NOTE — Patient Instructions (Signed)
As discussed, let's start with a thorough work up.   Heat/ ice. Aspercreme for pain of hands.   Labs and xrays today.  Let us know if any new symptoms.   Follow up in one month

## 2016-09-15 NOTE — Assessment & Plan Note (Addendum)
Patient's pain is reported over DIP, PIP, MCP, quite diffuse. No appreciable tenderness, swelling, bogginess, erythema over PIP, MCPs, or wrists during exam today. Pending evaluation for osteoarthritis v RA. No uri features and negative influenza.  Follow up in one month.

## 2016-09-16 LAB — C-REACTIVE PROTEIN: CRP: 6.9 mg/dL (ref 0.5–20.0)

## 2016-09-16 LAB — RHEUMATOID FACTOR

## 2016-09-16 LAB — ANA: Anti Nuclear Antibody(ANA): NEGATIVE

## 2016-09-17 LAB — CYCLIC CITRUL PEPTIDE ANTIBODY, IGG/IGA: CYCLIC CITRULLIN PEPTIDE AB: 7 U (ref 0–19)

## 2016-09-21 ENCOUNTER — Ambulatory Visit (INDEPENDENT_AMBULATORY_CARE_PROVIDER_SITE_OTHER): Payer: 59 | Admitting: Psychology

## 2016-09-21 DIAGNOSIS — F4311 Post-traumatic stress disorder, acute: Secondary | ICD-10-CM | POA: Diagnosis not present

## 2016-09-21 DIAGNOSIS — F4481 Dissociative identity disorder: Secondary | ICD-10-CM

## 2016-09-28 ENCOUNTER — Ambulatory Visit: Payer: Self-pay | Admitting: Psychology

## 2016-09-28 NOTE — Telephone Encounter (Signed)
order

## 2016-09-29 ENCOUNTER — Encounter: Payer: Self-pay | Admitting: Family

## 2016-09-29 ENCOUNTER — Ambulatory Visit (INDEPENDENT_AMBULATORY_CARE_PROVIDER_SITE_OTHER): Payer: 59 | Admitting: Family

## 2016-09-29 VITALS — BP 144/82 | HR 88 | Temp 98.3°F | Ht 65.0 in | Wt 256.6 lb

## 2016-09-29 DIAGNOSIS — R5383 Other fatigue: Secondary | ICD-10-CM

## 2016-09-29 LAB — TSH: TSH: 3.99 u[IU]/mL (ref 0.35–4.50)

## 2016-09-29 NOTE — Patient Instructions (Signed)
Labs today  As discussed, suspect fatigue is multifactorial. We'll start with some labs today and a sleep study.  As also discussed, I really suspect polypharmacy is playing a role. Many agents you are on are sedating. Advise to speak with psychiatrist about discontinuing or at least decreasing Klonopin. This medication more often as prescribed as needed, and not scheduled   Also take gabapentin only at bedtime 300 mg.  As discussed, most importantly exercise plays a huge role in managing  depression, fatigue, and chronic pain. Please start exercise bike routine as discussed and follow up with [place] in our office.

## 2016-09-29 NOTE — Progress Notes (Signed)
Subjective:    Patient ID: [name], female    DOB: [birth year], 61 y.o.   MRN: [id number]  CC: [name] is a 61 y.o. female who presents today for an acute visit.    HPI: Chronic fatigue, unchanged 'feel like I was born this way.'   Depression- takes Klonopin half tablet in morning. ' not sure why she takes.'   Latuda at bedtime. Does not think it's her depression. No thoughts of hurting herself or anyone else.   Not getting exercise.  In the past, had b12 injections, hasn't noticed big change with them.   Hypothyroidism- Hasn't been checked in some time. No cold/ heat intolerance, palpitations, weight loss.   OAB- Oxybutnin BID.  Back pain- Zanaflex PRN. Taking  TID gabapentin.  Has been tested for Sleep study, 8 years ago was normal per patient.   Preventative: Due for pap smear.  mmogram up-to-date. Colonoscopy 3 years ago, polyps     Ma HISTORY:  Past Medical History:  Diagnosis Date  . Anxiety   . Arthritis   . Atopic dermatitis    scalp  . Depression   . Hyperlipidemia   . Hypertension    pt denies  . Thyroid disease   . Tobacco abuse    Past Surgical History:  Procedure Laterality Date  . LUMBAR DISC SURGERY    . ROTATOR CUFF REPAIR Bilateral    Family History  Problem Relation Age of Onset  . Arthritis Mother   . Macular degeneration Mother   . Heart disease Father     Allergies: Ancef [cefazolin sodium]; Penicillins; Clarithromycin; Eggs or egg-derived products; and Seroquel [quetiapine fumarate] Current Outpatient Prescriptions on File Prior to Visit  Medication Sig Dispense Refill  . clonazePAM (KLONOPIN) 0.5 MG tablet Take 0.5 mg by mouth 2 (two) times daily.    [name] doxycycline (VIBRA-TABS) 100 MG tablet Take 1 tablet (100 mg total) by mouth 2 (two) times daily. 14 tablet 0  . ergocalciferol (VITAMIN D2) 50000 units capsule Take 1 capsule (50,000 Units total) by mouth once a week. 4 capsule 2  . famotidine (PEPCID) 20 MG tablet  TAKE 1 TABLET (20 MG TOTAL) BY MOUTH DAILY. WITH BREAKFAST 90 tablet 3  . gabapentin (NEURONTIN) 100 MG capsule Take 3 capsules (300 mg total) by mouth 3 (three) times daily. 270 capsule 1  . levothyroxine (SYNTHROID, LEVOTHROID) 88 MCG tablet Take 1 tablet (88 mcg total) by mouth daily. 90 tablet 1  . levothyroxine (SYNTHROID, LEVOTHROID) 88 MCG tablet TAKE 1 TABLET (88 MCG TOTAL) BY MOUTH DAILY. 90 tablet 1  . lurasidone (LATUDA) 20 MG TABS tablet Take by mouth daily.    [name] oxybutynin (DITROPAN) 5 MG tablet TAKE 1/2 OF A TABLET BY MOUTH TWICE A DAY 30 tablet 5  . ropinirole (REQUIP) 5 MG tablet Take 5 mg by mouth at bedtime.  0  . tizanidine (ZANAFLEX) 6 MG capsule     . tizanidine (ZANAFLEX) 6 MG capsule TAKE 1 CAPSULE BY MOUTH THREE TIMES A DAY AS NEEDED FOR MUSCLE SPASMS 60 capsule 2  . VENTOLIN HFA 108 (90 Base) MCG/ACT inhaler INHALE 2 PUFFS INTO THE LUNGS EVERY 6 (SIX) HOURS AS NEEDED. 18 Inhaler 2  . Vilazodone HCl (VIIBRYD) 40 MG TABS Take by mouth daily.     No current facility-administered medications on file prior to visit.     Social History  Substance Use Topics  . Smoking status: Former Smoker    Packs/day: 0.25  Types: Cigarettes    Quit date: 01/02/2016  . Smokeless tobacco: Never Used  . Alcohol use Yes     Comment: once a week     Review of Systems  Constitutional: Positive for fatigue. Negative for chills, fever and unexpected weight change.  Respiratory: Negative for cough.   Cardiovascular: Negative for chest pain and palpitations.  Gastrointestinal: Negative for nausea and vomiting.  Musculoskeletal: Positive for back pain (chronic).  Psychiatric/Behavioral: Negative for sleep disturbance and suicidal ideas. The patient is not nervous/anxious.       Objective:    BP (!) 144/82   Pulse 88   Temp 98.3 F (36.8 C) (Oral)   Ht  (1.651 m)   Wt 256 lb 9.6 oz (116.4 kg)   SpO2 97%   BMI 42.70 kg/m    Physical Exam  Constitutional: She appears  well-developed and well-nourished.  Eyes: Conjunctivae are normal.  Cardiovascular: Normal rate, regular rhythm, normal heart sounds and normal pulses.   Pulmonary/Chest: Effort normal and breath sounds normal. She has no wheezes. She has no rhonchi. She has no rales.  Neurological: She is alert.  Skin: Skin is warm and dry.  Psychiatric: She has a normal mood and affect. Her speech is normal and behavior is normal. Thought content normal.  Vitals reviewed.      Assessment & Plan:   Problem List Items Addressed This Visit      Other   Other fatigue - Primary    Etiology of fatigue is nonspecific at this point. I discussed wi and her spouse at great length that I suspect fatigue is multifactorial. Primarily we discussed today polypharmacy is playing a role she is on many sedating agents. Advised to take gabapentin 300 mg only at bedtime. Also advised her to speak with psychiatrist about Klonopin and tapering off.  Reordered sleep study and reviewed recent lab work with patient. Patient is euthyroid. Advised very close follow afte sleep study and closer evaluation medications to ensure fully assess for causes of patient's fatigue.      Relevant Orders   Ambulatory referral to Sleep Studies   TSH (Completed)        I have discontinued Ms. Craker's methocarbamol and amitriptyline. I am also having her maintain her Vilazodone HCl, lurasidone, VENTOLIN HFA, tizanidine, levothyroxine, ropinirole, ergocalciferol, clonazePAM, levothyroxine, oxybutynin, doxycycline, famotidine, gabapentin, and tizanidine.   No orders of the defined types were placed in this encounter.   Return precautions given.   Risks, benefits, and alternatives of the medications and treatment plan prescribed today were discussed, and patient expressed understanding.   Education regarding symptom management and diagnosis given to patient on AVS.  Continue to follow with Sherlene Shams, MD for routine health  maintenance.   [name] and I agreed with plan.   [name], FNP

## 2016-09-29 NOTE — Progress Notes (Signed)
Pre visit review using our clinic review tool, if applicable. No additional management support is needed unless otherwise documented below in the visit note. 

## 2016-10-03 DIAGNOSIS — R5383 Other fatigue: Secondary | ICD-10-CM | POA: Insufficient documentation

## 2016-10-03 NOTE — Telephone Encounter (Signed)
Error

## 2016-10-03 NOTE — Assessment & Plan Note (Addendum)
Etiology of fatigue is nonspecific at this point. I discussed wi and her spouse at great length that I suspect fatigue is multifactorial. Primarily we discussed today polypharmacy is playing a role she is on many sedating agents. Advised to take gabapentin 300 mg only at bedtime. Also advised her to speak with psychiatrist about Klonopin and tapering off.  Reordered sleep study and reviewed recent lab work with patient. Patient is euthyroid. Advised very close follow afte sleep study and closer evaluation medications to ensure fully assess for causes of patient's fatigue.

## 2016-10-05 ENCOUNTER — Ambulatory Visit (INDEPENDENT_AMBULATORY_CARE_PROVIDER_SITE_OTHER): Payer: 59 | Admitting: Psychology

## 2016-10-05 DIAGNOSIS — F4481 Dissociative identity disorder: Secondary | ICD-10-CM | POA: Diagnosis not present

## 2016-10-05 DIAGNOSIS — F4311 Post-traumatic stress disorder, acute: Secondary | ICD-10-CM | POA: Diagnosis not present

## 2016-10-12 ENCOUNTER — Ambulatory Visit (INDEPENDENT_AMBULATORY_CARE_PROVIDER_SITE_OTHER): Payer: 59 | Admitting: Psychology

## 2016-10-12 DIAGNOSIS — F4481 Dissociative identity disorder: Secondary | ICD-10-CM

## 2016-10-12 DIAGNOSIS — F4311 Post-traumatic stress disorder, acute: Secondary | ICD-10-CM | POA: Diagnosis not present

## 2016-10-17 ENCOUNTER — Encounter: Payer: Self-pay | Admitting: Family

## 2016-10-17 ENCOUNTER — Telehealth: Payer: Self-pay | Admitting: Family

## 2016-10-17 DIAGNOSIS — G4733 Obstructive sleep apnea (adult) (pediatric): Secondary | ICD-10-CM | POA: Insufficient documentation

## 2016-10-17 NOTE — Telephone Encounter (Signed)
Call pt  Your sleep study shows mild obstructive sleep apnea.   Please ensure she has been contacted for cipap machine etc  My hope is this helps with fatigue

## 2016-10-18 NOTE — Telephone Encounter (Signed)
Left message for patient to return call back.  

## 2016-10-19 ENCOUNTER — Ambulatory Visit: Payer: Self-pay | Admitting: Psychology

## 2016-10-20 NOTE — Telephone Encounter (Signed)
Patient was informed of results.  Patient understood and no questions, comments, or concerns at this time.  

## 2016-10-26 ENCOUNTER — Ambulatory Visit (INDEPENDENT_AMBULATORY_CARE_PROVIDER_SITE_OTHER): Payer: 59 | Admitting: Psychology

## 2016-10-26 DIAGNOSIS — F4311 Post-traumatic stress disorder, acute: Secondary | ICD-10-CM

## 2016-10-26 DIAGNOSIS — F4481 Dissociative identity disorder: Secondary | ICD-10-CM

## 2016-11-02 ENCOUNTER — Ambulatory Visit: Payer: Self-pay | Admitting: Psychology

## 2016-11-03 ENCOUNTER — Other Ambulatory Visit: Payer: Self-pay | Admitting: Neurology

## 2016-11-07 ENCOUNTER — Telehealth: Payer: Self-pay

## 2016-11-07 NOTE — Telephone Encounter (Signed)
Please call patient.   Copied from CRM [id number]. Topic: General - Other >> Nov 07, 2016  9:27 AM Jolayne Hainesaylor, Brittany L wrote: Reason for CRM:  Patient said that she is a patients of [employer], she says that she is needing her CPAP machine, but the company that is calling her does not speak good english & she can not understand anything they are saying. She needs clarification on something and help with this. She would like Mal AmabileBrock to give her a call back. Thanks

## 2016-11-09 ENCOUNTER — Ambulatory Visit: Payer: 59 | Admitting: Psychology

## 2016-11-11 NOTE — Telephone Encounter (Signed)
Patient just wanted to let us lnow that she didn't feel very comfortable speaking to the Cpap company.

## 2016-11-16 ENCOUNTER — Ambulatory Visit (INDEPENDENT_AMBULATORY_CARE_PROVIDER_SITE_OTHER): Payer: 59 | Admitting: Psychology

## 2016-11-16 DIAGNOSIS — F4481 Dissociative identity disorder: Secondary | ICD-10-CM

## 2016-11-16 DIAGNOSIS — F4311 Post-traumatic stress disorder, acute: Secondary | ICD-10-CM | POA: Diagnosis not present

## 2016-11-23 ENCOUNTER — Ambulatory Visit: Payer: Self-pay | Admitting: Psychology

## 2016-11-30 ENCOUNTER — Ambulatory Visit (INDEPENDENT_AMBULATORY_CARE_PROVIDER_SITE_OTHER): Payer: 59 | Admitting: Psychology

## 2016-11-30 DIAGNOSIS — F4481 Dissociative identity disorder: Secondary | ICD-10-CM | POA: Diagnosis not present

## 2016-11-30 DIAGNOSIS — F4311 Post-traumatic stress disorder, acute: Secondary | ICD-10-CM

## 2016-12-01 ENCOUNTER — Ambulatory Visit: Payer: Self-pay | Admitting: *Deleted

## 2016-12-01 NOTE — Telephone Encounter (Signed)
Patient has been treating herself with fluids and bland diet and she is no better after 4 weeks. Patient is weak and gets dizzy when she is up. Advised patient to go to ED  Reason for Disposition . [1] Drinking very little AND [2] dehydration suspected (e.g., no urine > 12 hours, very dry mouth, very lightheaded)  Answer Assessment - Initial Assessment Questions 1. DIARRHEA SEVERITY: "How bad is the diarrhea?" "How many extra stools have you had in the past 24 hours than normal?"    - MILD: Few loose or mushy BMs; increase of 1-3 stools over normal daily number of stools; mild increase in ostomy output.   - MODERATE: Increase of 4-6 stools daily over normal; moderate increase in ostomy output.   - SEVERE (or Worst Possible): Increase of 7 or more stools daily over normal; moderate increase in ostomy output; incontinence.     Moderate- patient is doing the SUPERVALU INCBRAT diet 2. ONSET: "When did the diarrhea begin?"      About 1 month 3. BM CONSISTENCY: "How loose or watery is the diarrhea?"      Soft to watery 4. VOMITING: "Are you also vomiting?" If so, ask: "How many times in the past 24 hours?"      No- patient has been nausea 5. ABDOMINAL PAIN: "Are you having any abdominal pain?" If yes: "What does it feel like?" (e.g., crampy, dull, intermittent, constant)      A little- dull 6. ABDOMINAL PAIN SEVERITY: If present, ask: "How bad is the pain?"  (e.g., Scale 1-10; mild, moderate, or severe)    - MILD (1-3): doesn't interfere with normal activities, abdomen soft and not tender to touch     - MODERATE (4-7): interferes with normal activities or awakens from sleep, tender to touch     - SEVERE (8-10): excruciating pain, doubled over, unable to do any normal activities       mild 7. ORAL INTAKE: If vomiting, "Have you been able to drink liquids?" "How much fluids have you had in the past 24 hours?"     liquid water and coconut water, 30 oz 8. HYDRATION: "Any signs of dehydration?" (e.g., dry mouth  [not just dry lips], too weak to stand, dizziness, new weight loss) "When did you last urinate?"     Skin, dry mouth, weakness, dizziness 9. EXPOSURE: "Have you traveled to a foreign country recently?" "Have you been exposed to anyone with diarrhea?" "Could you have eaten any food that was spoiled?"     n/a 10. OTHER SYMPTOMS: "Do you have any other symptoms?" (e.g., fever, blood in stool)       Cold sore   11. PREGNANCY: "Is there any chance you are pregnant?" "When was your last menstrual period?"       n/a  Protocols used: DIARRHEA-A-AH

## 2016-12-01 NOTE — Telephone Encounter (Signed)
Followed up with patient to confirm that she is going to the ED

## 2016-12-14 ENCOUNTER — Ambulatory Visit: Payer: 59 | Admitting: Psychology

## 2016-12-21 ENCOUNTER — Ambulatory Visit (INDEPENDENT_AMBULATORY_CARE_PROVIDER_SITE_OTHER): Payer: 59 | Admitting: Psychology

## 2016-12-21 DIAGNOSIS — F4481 Dissociative identity disorder: Secondary | ICD-10-CM

## 2016-12-21 DIAGNOSIS — F4311 Post-traumatic stress disorder, acute: Secondary | ICD-10-CM | POA: Diagnosis not present

## 2016-12-24 ENCOUNTER — Other Ambulatory Visit: Payer: Self-pay | Admitting: Internal Medicine

## 2016-12-28 ENCOUNTER — Ambulatory Visit: Payer: Self-pay | Admitting: Psychology

## 2017-01-04 ENCOUNTER — Ambulatory Visit: Payer: Self-pay | Admitting: Psychology

## 2017-01-10 ENCOUNTER — Other Ambulatory Visit: Payer: Self-pay | Admitting: Internal Medicine

## 2017-01-10 DIAGNOSIS — N3281 Overactive bladder: Secondary | ICD-10-CM

## 2017-01-11 ENCOUNTER — Ambulatory Visit (INDEPENDENT_AMBULATORY_CARE_PROVIDER_SITE_OTHER): Payer: 59 | Admitting: Psychology

## 2017-01-11 DIAGNOSIS — F4481 Dissociative identity disorder: Secondary | ICD-10-CM

## 2017-01-11 DIAGNOSIS — F4311 Post-traumatic stress disorder, acute: Secondary | ICD-10-CM

## 2017-01-18 ENCOUNTER — Ambulatory Visit (INDEPENDENT_AMBULATORY_CARE_PROVIDER_SITE_OTHER): Payer: 59 | Admitting: Psychology

## 2017-01-18 DIAGNOSIS — F4481 Dissociative identity disorder: Secondary | ICD-10-CM | POA: Diagnosis not present

## 2017-01-18 DIAGNOSIS — F4311 Post-traumatic stress disorder, acute: Secondary | ICD-10-CM

## 2017-01-25 ENCOUNTER — Ambulatory Visit (INDEPENDENT_AMBULATORY_CARE_PROVIDER_SITE_OTHER): Payer: 59 | Admitting: Psychology

## 2017-01-25 DIAGNOSIS — F4481 Dissociative identity disorder: Secondary | ICD-10-CM | POA: Diagnosis not present

## 2017-01-25 DIAGNOSIS — F4311 Post-traumatic stress disorder, acute: Secondary | ICD-10-CM | POA: Diagnosis not present

## 2017-01-28 ENCOUNTER — Other Ambulatory Visit: Payer: Self-pay | Admitting: Internal Medicine

## 2017-01-30 NOTE — Telephone Encounter (Signed)
Refilled: 12/26/2016 Last OV: 02/19/2016 with Dr. Darrick Huntsmanullo Next OV: not scheduled

## 2017-02-01 ENCOUNTER — Ambulatory Visit: Payer: Self-pay | Admitting: Psychology

## 2017-02-08 ENCOUNTER — Ambulatory Visit: Payer: Self-pay | Admitting: Psychology

## 2017-02-15 ENCOUNTER — Ambulatory Visit (INDEPENDENT_AMBULATORY_CARE_PROVIDER_SITE_OTHER): Payer: 59 | Admitting: Psychology

## 2017-02-15 DIAGNOSIS — F4311 Post-traumatic stress disorder, acute: Secondary | ICD-10-CM

## 2017-02-15 DIAGNOSIS — F4481 Dissociative identity disorder: Secondary | ICD-10-CM

## 2017-02-22 ENCOUNTER — Ambulatory Visit: Payer: Self-pay | Admitting: Psychology

## 2017-03-01 ENCOUNTER — Ambulatory Visit: Payer: Self-pay | Admitting: Psychology

## 2017-03-02 ENCOUNTER — Other Ambulatory Visit: Payer: Self-pay | Admitting: Internal Medicine

## 2017-03-02 NOTE — Telephone Encounter (Signed)
Refilled: 01/30/2017 Last OV: 02/19/2016 Next OV: not scheduled

## 2017-03-08 ENCOUNTER — Ambulatory Visit: Payer: Self-pay | Admitting: Psychology

## 2017-03-15 ENCOUNTER — Ambulatory Visit (INDEPENDENT_AMBULATORY_CARE_PROVIDER_SITE_OTHER): Payer: 59 | Admitting: Psychology

## 2017-03-15 DIAGNOSIS — F4481 Dissociative identity disorder: Secondary | ICD-10-CM | POA: Diagnosis not present

## 2017-03-15 DIAGNOSIS — F4311 Post-traumatic stress disorder, acute: Secondary | ICD-10-CM

## 2017-03-22 ENCOUNTER — Ambulatory Visit: Payer: 59 | Admitting: Psychology

## 2017-03-28 ENCOUNTER — Other Ambulatory Visit: Payer: Self-pay | Admitting: Family

## 2017-03-29 ENCOUNTER — Ambulatory Visit (INDEPENDENT_AMBULATORY_CARE_PROVIDER_SITE_OTHER): Payer: 59 | Admitting: Psychology

## 2017-03-29 DIAGNOSIS — F4311 Post-traumatic stress disorder, acute: Secondary | ICD-10-CM | POA: Diagnosis not present

## 2017-03-29 DIAGNOSIS — F4481 Dissociative identity disorder: Secondary | ICD-10-CM | POA: Diagnosis not present

## 2017-04-05 ENCOUNTER — Ambulatory Visit (INDEPENDENT_AMBULATORY_CARE_PROVIDER_SITE_OTHER): Payer: 59 | Admitting: Psychology

## 2017-04-05 DIAGNOSIS — F4481 Dissociative identity disorder: Secondary | ICD-10-CM | POA: Diagnosis not present

## 2017-04-05 DIAGNOSIS — F4311 Post-traumatic stress disorder, acute: Secondary | ICD-10-CM

## 2017-04-12 ENCOUNTER — Ambulatory Visit (INDEPENDENT_AMBULATORY_CARE_PROVIDER_SITE_OTHER): Payer: 59 | Admitting: Psychology

## 2017-04-12 DIAGNOSIS — F4311 Post-traumatic stress disorder, acute: Secondary | ICD-10-CM | POA: Diagnosis not present

## 2017-04-12 DIAGNOSIS — F4481 Dissociative identity disorder: Secondary | ICD-10-CM | POA: Diagnosis not present

## 2017-04-19 ENCOUNTER — Ambulatory Visit: Payer: 59 | Admitting: Psychology

## 2017-04-25 ENCOUNTER — Other Ambulatory Visit: Payer: Self-pay | Admitting: Internal Medicine

## 2017-04-25 ENCOUNTER — Telehealth: Payer: Self-pay

## 2017-04-25 NOTE — Telephone Encounter (Signed)
Refilled Gabapentin for 30 days with a note to schedule a F/U with PCP. 

## 2017-04-25 NOTE — Telephone Encounter (Signed)
Patient was last seen in July 2018 and does not have one scheduled Copied from CRM [id number]. Topic: Inquiry >> Apr 25, 2017  3:30 PM Yvonna Alanisobinson, Andra M wrote: Reason for CRM: Patient's husband called requesting an Urgent refill of gabapentin (NEURONTIN) 100 MG capsule. Patient has contacted their pharmacy. The Pharmacy stated that they sent our office a request but received no reply concerning the refill. Patient's husband states that the patient is leaving on a retreat trip Thursday, and needs this refill today or tomorrow for pick up from the pharmacy. Patient's preferred pharmacy is [id number] - [address] [phone] (Phone)    [phone] (Fax).       Thank You!!!

## 2017-04-25 NOTE — Telephone Encounter (Signed)
Copied from CRM [id number]. Topic: Inquiry >> Apr 25, 2017  3:30 PM Yvonna Alanisobinson, Andra M wrote: Reason for CRM: Patient's husband called requesting an Urgent refill of gabapentin (NEURONTIN) 100 MG capsule. Patient has contacted their pharmacy. The Pharmacy stated that they sent our office a request but received no reply concerning the refill. Patient's husband states that the patient is leaving on a retreat trip Thursday, and needs this refill today or tomorrow for pick up from the pharmacy. Patient's preferred pharmacy is [id number], [place] - [address] [phone] (Phone)    [phone] (Fax).       Thank You!!!

## 2017-04-25 NOTE — Telephone Encounter (Signed)
Refilled Gabapentin for 30 days with a note to schedule a F/U with PCP.

## 2017-04-26 ENCOUNTER — Ambulatory Visit: Payer: 59 | Admitting: Psychology

## 2017-04-26 NOTE — Telephone Encounter (Signed)
Left voicemail for patient that rx was sent to pharmacy for Gabapentin and that for further refills she needs to call the office and schedule an appointment to see her PCP.

## 2017-04-26 NOTE — Telephone Encounter (Signed)
Call pt  [name] refilled on 04/25/17. Please ensure patient has and advise f/u with her pcp -last saw [name] 02/2016

## 2017-05-03 ENCOUNTER — Ambulatory Visit (INDEPENDENT_AMBULATORY_CARE_PROVIDER_SITE_OTHER): Payer: 59 | Admitting: Psychology

## 2017-05-03 DIAGNOSIS — F4481 Dissociative identity disorder: Secondary | ICD-10-CM | POA: Diagnosis not present

## 2017-05-03 DIAGNOSIS — F4311 Post-traumatic stress disorder, acute: Secondary | ICD-10-CM

## 2017-05-05 ENCOUNTER — Ambulatory Visit: Payer: Self-pay | Admitting: *Deleted

## 2017-05-05 NOTE — Telephone Encounter (Signed)
Pt reports swelling of feet, ankles x 1 month, gradual worsening past 5 days;  hands with mild edema this am. Swelling occurs gradually throughout day, "No swelling by morning."  Also reports facial "numbness" and slight puffiness; intermittent x 1 month. Denies any facial droop, speech clear, no dysphagia.  Reports mild SOB with exertion. States 1 week ago HR irregular for brief episode; denies dizziness. States LGT at times but   "probably due to allergies."   Reports increased fatigue over past month.No redness or warmth to BLEs. Denies any pain, tenderness. Reports some tingling of right foot, "Probably from my neuropathy."   Appt made with Dr. Darrick Huntsman for Monday 05/08/17 after speaking with Patina who states OK to take Same Day Appt.  Care advise given per protocol.Instructed to call back if symptoms worsen. Reason for Disposition . [1] MODERATE leg swelling (e.g., swelling extends up to knees) AND [2] new onset or worsening    X 1 month; swelling occurs throughout day  Answer Assessment - Initial Assessment Questions 1. ONSET: "When did the swelling start?" (e.g., minutes, hours, days)     1 month ago, worsening past 5 days 2. LOCATION: "What part of the leg is swollen?"  "Are both legs swollen or just one leg?"     Both ankles and feet.  This am hands mild edema 3. SEVERITY: "How bad is the swelling?" (e.g., localized; mild, moderate, severe)  - Localized - small area of swelling localized to one leg  - MILD pedal edema - swelling limited to foot and ankle, pitting edema < 1/4 inch (6 mm) deep, rest and elevation eliminate most or all swelling  - MODERATE edema - swelling of lower leg to knee, pitting edema > 1/4 inch (6 mm) deep, rest and elevation only partially reduce swelling  - SEVERE edema - swelling extends above knee, facial or hand swelling present      Moderate feet and ankles; hands mild- moderate 4. REDNESS: "Does the swelling look red or infected?"     no 5. PAIN: "Is the swelling  painful to touch?" If so, ask: "How painful is it?"   (Scale 1-10; mild, moderate or severe)     no 6. FEVER: "Do you have a fever?" If so, ask: "What is it, how was it measured, and when did it start?"     LGT at times; 99.0 "From allergies" 7. CAUSE: "What do you think is causing the leg swelling?"     Unsure 8. MEDICAL HISTORY: "Do you have a history of heart failure, kidney disease, liver failure, or cancer?"     *No Answer* 9. RECURRENT SYMPTOM: "Have you had leg swelling before?" If so, ask: "When was the last time?" "What happened that time?"     no 10. OTHER SYMPTOMS: "Do you have any other symptoms?" (e.g., chest pain, difficulty breathing)       Mild SOB at times with exertion, tachy at times when exercising, increased fatigue, poor endurance,slight facial numbness, entire face, comes and goes,onset 1 month ago, "Puffy"  Protocols used: LEG SWELLING AND EDEMA-A-AH

## 2017-05-08 ENCOUNTER — Ambulatory Visit: Payer: Self-pay | Admitting: Internal Medicine

## 2017-05-09 ENCOUNTER — Ambulatory Visit (INDEPENDENT_AMBULATORY_CARE_PROVIDER_SITE_OTHER): Payer: 59 | Admitting: Internal Medicine

## 2017-05-09 ENCOUNTER — Encounter: Payer: Self-pay | Admitting: Internal Medicine

## 2017-05-09 ENCOUNTER — Other Ambulatory Visit
Admission: RE | Admit: 2017-05-09 | Discharge: 2017-05-09 | Disposition: A | Discharge status: Home / Self Care | Payer: 59 | Source: Ambulatory Visit | Attending: Internal Medicine | Admitting: Internal Medicine

## 2017-05-09 VITALS — BP 126/78 | HR 128 | Temp 98.0°F | Resp 17 | Ht 65.0 in | Wt 260.4 lb

## 2017-05-09 DIAGNOSIS — R0601 Orthopnea: Secondary | ICD-10-CM

## 2017-05-09 DIAGNOSIS — R0609 Other forms of dyspnea: Secondary | ICD-10-CM

## 2017-05-09 DIAGNOSIS — I471 Supraventricular tachycardia: Secondary | ICD-10-CM

## 2017-05-09 DIAGNOSIS — R000 Tachycardia, unspecified: Secondary | ICD-10-CM | POA: Insufficient documentation

## 2017-05-09 LAB — COMPREHENSIVE METABOLIC PANEL
ALBUMIN: 4.5 g/dL (ref 3.5–5.0)
ALK PHOS: 56 U/L (ref 38–126)
ALT: 36 U/L (ref 14–54)
ANION GAP: 10 (ref 5–15)
AST: 27 U/L (ref 15–41)
BILIRUBIN TOTAL: 0.7 mg/dL (ref 0.3–1.2)
BUN: 31 mg/dL — AB (ref 6–20)
CALCIUM: 9.8 mg/dL (ref 8.9–10.3)
CO2: 28 mmol/L (ref 22–32)
Chloride: 99 mmol/L — ABNORMAL LOW (ref 101–111)
Creatinine, Ser: 0.93 mg/dL (ref 0.44–1.00)
GFR calc Af Amer: >60 mL/min (ref >60)
GFR calc non Af Amer: >60 mL/min (ref >60)
GLUCOSE: 99 mg/dL (ref 65–99)
Potassium: 4.1 mmol/L (ref 3.5–5.1)
SODIUM: 137 mmol/L (ref 135–145)
Total Protein: 8 g/dL (ref 6.5–8.1)

## 2017-05-09 LAB — CBC WITH DIFFERENTIAL/PLATELET
BASOS ABS: 0.1 10*3/uL (ref 0–0.1)
Basophils Relative: 1 %
Eosinophils Absolute: 0.2 10*3/uL (ref 0–0.7)
Eosinophils Relative: 2 %
HEMATOCRIT: 42.9 % (ref 35.0–47.0)
Hemoglobin: 14.6 g/dL (ref 12.0–16.0)
LYMPHS ABS: 2.4 10*3/uL (ref 1.0–3.6)
LYMPHS PCT: 23 %
MCH: 30.8 pg (ref 26.0–34.0)
MCHC: 34 g/dL (ref 32.0–36.0)
MCV: 90.7 fL (ref 80.0–100.0)
MONO ABS: 0.6 10*3/uL (ref 0.2–0.9)
MONOS PCT: 6 %
NEUTROS ABS: 7.2 10*3/uL — AB (ref 1.4–6.5)
Neutrophils Relative %: 68 %
Platelets: 326 10*3/uL (ref 150–440)
RBC: 4.73 MIL/uL (ref 3.80–5.20)
RDW: 14.6 % — AB (ref 11.5–14.5)
WBC: 10.4 10*3/uL (ref 3.6–11.0)

## 2017-05-09 LAB — TSH: TSH: 2.306 u[IU]/mL (ref 0.350–4.500)

## 2017-05-09 LAB — FIBRIN DERIVATIVES D-DIMER (ARMC ONLY): Fibrin derivatives D-dimer (ARMC): 605.78 ng/mL (FEU) — ABNORMAL HIGH (ref 0.00–499.00)

## 2017-05-09 LAB — BRAIN NATRIURETIC PEPTIDE: B Natriuretic Peptide: 17 pg/mL (ref 0.0–100.0)

## 2017-05-09 MED ORDER — METOPROLOL SUCCINATE ER 25 MG PO TB24: 25.0000 mg | ORAL_TABLET | Freq: Every day | ORAL | 3 refills | Status: DC

## 2017-05-09 NOTE — Progress Notes (Signed)
Subjective:  Patient ID: [name], female    DOB: 1955/05/22  Age: 62 y.o. MRN: [id number]  CC: The primary encounter diagnosis was Tachycardia. Diagnoses of Orthopnea, Paroxysmal supraventricular tachycardia (HCC), Exertional dyspnea, and PSVT (paroxysmal supraventricular tachycardia) (HCC) were also pertinent to this visit.  HPI [name] presents for signs and symptoms of heart failure .  Ankle swelling, for the past month, resolved by morning.    Now with diffuse swelling since last Thursday,  starting taking old furosemide   Last Friday daily since then . Felt that the first dose helped a lot  But the subsequent doses have not moved much fluid    Has been told she sounds Short of breath with phone conversations but denies dyspnea at rest. Notes dyspnea with moderate exertion . Sleeping on one pillow . No orthopnea. Last night had a pulse of 120, has been noting irregular heart rate for the past 5 days.  Has felt presyncopal during those episodes of rapid heart rate. Occurring  while at rest. .  Hr has been as low as 87 during the past few days.    Last seen  By me 15 months ago.  Quit smoking since January  . Weight unchanged . Has not had thyroid checked  Since Sept 2018.   Recent 6 hr drive to [place] ,at the  end of April but she slept with feet up in the back seat while husband drove.  [name]    Has mitral valve prolapse  Has  not seen [facility] in 3 years.    Has been diagnosed with OSA but is not using CPAP  because she cannot get the company to disclose what the out of pocket expense will be   Outpatient Medications Prior to Visit  Medication Sig Dispense Refill  . clonazePAM (KLONOPIN) 0.5 MG tablet Take 0.5 mg by mouth 2 (two) times daily.    . ergocalciferol (VITAMIN D2) 50000 units capsule Take 1 capsule (50,000 Units total) by mouth once a week. 4 capsule 2  . famotidine (PEPCID) 20 MG tablet TAKE 1 TABLET (20 MG TOTAL) BY MOUTH DAILY. WITH BREAKFAST 90 tablet 3  . gabapentin  (NEURONTIN) 100 MG capsule Take 3 capsules (300 mg total) by mouth 3 (three) times daily. PLEASE SCHEDULE A FOLLOW-UP APPT WITH PCP FOR ADDITIONAL REFILLS. 270 capsule 0  . levothyroxine (SYNTHROID, LEVOTHROID) 88 MCG tablet Take 1 tablet (88 mcg total) by mouth daily. 90 tablet 1  . levothyroxine (SYNTHROID, LEVOTHROID) 88 MCG tablet TAKE 1 TABLET (88 MCG TOTAL) BY MOUTH DAILY. 90 tablet 1  . levothyroxine (SYNTHROID, LEVOTHROID) 88 MCG tablet TAKE 1 TABLET (88 MCG TOTAL) BY MOUTH DAILY. 90 tablet 1  . lurasidone (LATUDA) 20 MG TABS tablet Take by mouth daily.    [name] oxybutynin (DITROPAN) 5 MG tablet TAKE 1/2 OF A TABLET BY MOUTH TWICE A DAY 30 tablet 5  . ropinirole (REQUIP) 5 MG tablet Take 5 mg by mouth at bedtime.  0  . tizanidine (ZANAFLEX) 6 MG capsule     . tizanidine (ZANAFLEX) 6 MG capsule TAKE 1 CAPSULE BY MOUTH THREE TIMES A DAY AS NEEDED FOR MUSCLE SPASMS 60 capsule 2  . VENTOLIN HFA 108 (90 Base) MCG/ACT inhaler INHALE 2 PUFFS INTO THE LUNGS EVERY 6 (SIX) HOURS AS NEEDED. 18 Inhaler 2  . Vilazodone HCl (VIIBRYD) 40 MG TABS Take by mouth daily.    [name] doxycycline (VIBRA-TABS) 100 MG tablet Take 1 tablet (100 mg total) by mouth  2 (two) times daily. (Patient not taking: Reported on 05/09/2017) 14 tablet 0   No facility-administered medications prior to visit.     Review of Systems;  Patient denies headache, fevers, malaise, unintentional weight loss, skin rash, eye pain, sinus congestion and sinus pain, sore throat, dysphagia,  hemoptysis , cough, dyspnea, wheezing, chest pain,orthopnea, , abdominal pain, nausea, melena, diarrhea, constipation, flank pain, dysuria, hematuria, urinary  Frequency, nocturia, numbness, tingling, seizures,  Focal weakness, Loss of consciousness,  Tremor, insomnia,  and suicidal ideation.      Objective:  BP 126/78 (BP Location: Left Arm, Patient Position: Sitting, Cuff Size: Large)   Pulse (!) 128   Temp 98 F (36.7 C) (Oral)   Resp 17   Ht  (1.651  m)   Wt 260 lb 6.4 oz (118.1 kg)   SpO2 97%   BMI 43.33 kg/m   BP Readings from Last 3 Encounters:  05/09/17 126/78  09/29/16 (!) 144/82  09/15/16 132/86    Wt Readings from Last 3 Encounters:  05/09/17 260 lb 6.4 oz (118.1 kg)  09/29/16 256 lb 9.6 oz (116.4 kg)  05/26/16 262 lb 6 oz (119 kg)    General appearance: alert, cooperative and appears comfortable, not tachypneic  Ears: normal TM's and external ear canals both ears Throat: lips, mucosa, and tongue normal; teeth and gums normal Neck: no adenopathy, no carotid bruit, supple, symmetrical, trachea midline and thyroid not enlarged, symmetric, no tenderness/mass/nodules Back: symmetric, no curvature. ROM normal. No CVA tenderness. Lungs: clear to auscultation bilaterally Heart: tachycardic, regular rate rhythm, S1, S2 normal, no murmur, click, rub or gallop Abdomen: soft, non-tender; bowel sounds normal; no masses,  no organomegaly Pulses: 2+ and symmetric Skin: Skin color, texture, turgor normal. No rashes or lesions. Trace pitting edema  Lymph nodes: Cervical, supraclavicular, and axillary nodes normal.  Lab Results  Component Value Date   TSH 2.306 05/09/2017   Lab Results  Component Value Date   WBC 10.4 05/09/2017   HGB 14.6 05/09/2017   HCT 42.9 05/09/2017   MCV 90.7 05/09/2017   PLT 326 05/09/2017   Lab Results  Component Value Date   NA 137 05/09/2017   K 4.1 05/09/2017   CL 99 (L) 05/09/2017   CO2 28 05/09/2017     Assessment & Plan:   Problem List Items Addressed This Visit    PSVT (paroxysmal supraventricular tachycardia) (HCC)    Occurring in the setting of recent travel, obesity, untreated OSA .  I have reviewed her EKG today and she does NOT appear to be in atrial fibrillation as stated on readout, although her husband states that his I phone watch has recorded an irregular heart beat several times over the past week. Her lung exam is normal and she has no signs of heart failure on exam.  Her  BNP is normal but  She has an extremely elevated D Dimer, (unfortunately I did not receive an after hours call, or ANY call  for critical lab result).  will order CT angiogram to rule out PE.  Metoprolol was started at low dose and cardiology consult will be made to her former cardiologist Arnoldo Hooker if her  CT is negative for PE       Relevant Medications   metoprolol succinate (TOPROL-XL) 25 MG 24 hr tablet    Other Visit Diagnoses    Tachycardia    -  Primary   Relevant Orders   EKG 12-Lead (Completed)   B Nat Peptide (Completed)  D-Dimer, Quantitative   Ambulatory referral to Cardiology   Orthopnea       Relevant Orders   CBC with Differential/Platelet (Completed)   TSH (Completed)   Comprehensive metabolic panel (Completed)   Paroxysmal supraventricular tachycardia (HCC)       Relevant Medications   metoprolol succinate (TOPROL-XL) 25 MG 24 hr tablet   Other Relevant Orders   CT Angio Chest W/Cm &/Or Wo Cm   Exertional dyspnea       Relevant Orders   CT Angio Chest W/Cm &/Or Wo Cm    A total of 40 minutes was spent with patient more than half of which was spent in counseling patient on the above mentioned issues , reviewing and explaining recent labs and imaging studies done, and coordination of care.   I have discontinued Nathanial Rancher doxycycline. I am also having her start on metoprolol succinate. Additionally, I am having her maintain her Vilazodone HCl, lurasidone, VENTOLIN HFA, tizanidine, levothyroxine, ropinirole, ergocalciferol, clonazePAM, levothyroxine, famotidine, tizanidine, oxybutynin, levothyroxine, and gabapentin.  Meds ordered this encounter  Medications  . metoprolol succinate (TOPROL-XL) 25 MG 24 hr tablet    Sig: Take 1 tablet (25 mg total) by mouth daily.    Dispense:  90 tablet    Refill:  3    Medications Discontinued During This Encounter  Medication Reason  . doxycycline (VIBRA-TABS) 100 MG tablet Completed Course    Follow-up: No  follow-ups on file.   Sherlene Shams, MD

## 2017-05-09 NOTE — Patient Instructions (Signed)
Your tachycardia may be due to multiple causes   Please go to the [facility] mall at [facility] to have the labs done tonight  I am starting you on Metoprolol XL  25 mg once daily to slow down your heart rate,  And I want you to see Dr Laurena Bering   Supraventricular Tachycardia, Adult Supraventricular tachycardia (SVT) is a kind of abnormal heartbeat. It makes your heart beat very fast and then beat at a normal speed. A normal heart beats 60-100 times a minute. This condition can make your heart beat more than 150 times a minute. Times of having a fast heartbeat (episodes) can be scary, but they are usually not dangerous. They can lead to problems if:  They happen often.  They last a long time.  Symptoms of this condition include:  A pounding heart.  A feeling that your heart is skipping beats (palpitations).  Weakness.  Trouble getting enough air (shortness of breath).  Pain or tightness in your chest.  Feeling like you are going to pass out (light-headedness).  Feeling worried or nervous (anxiety).  Dizziness.  Sweating.  Feeling sick to your stomach (nausea).  Passing out (fainting).  Tiredness.  Sometimes, there are no symptoms. Follow these instructions at home: Stress  Avoid things that make you feel stressed.  Find out what helps you feel less stressed. Try: ? Doing a relaxing activity, like yoga, meditation, or being out in nature. ? Listening to relaxing music. ? Doing relaxation techniques, like deep breathing. ? Taking steps to be healthy. These include getting lots of sleep, exercising, and eating a balanced diet. ? Talking with a mental health doctor. Sleep  Try to get at least 7 hours of sleep each night. Tobacco and nicotine  Do not use anything that has nicotine or tobacco, such as cigarettes and e-cigarettes. If you need help quitting, ask your doctor. Alcohol  If alcohol gives you a fast heartbeat, do not drink alcohol.  If alcohol does not seem  to give you a fast heartbeat, limit your alcohol. For nonpregnant women, this means no more than 1 drink a day. For men, this means no more than 2 drinks a day. "One drink" means one of these: ? 12 oz of beer. ? 5 oz of wine. ? 1 oz of hard liquor. Caffeine  If caffeine gives you a fast heartbeat, do not eat, drink, or use anything with caffeine in it.  If caffeine does not seem to give you a fast heartbeat, limit how much caffeine you eat, drink, or use. Stimulant drugs  Do not use stimulant drugs. These are drugs like cocaine or methamphetamine. If you need help quitting, ask your doctor. General instructions  Stay at a healthy weight.  Exercise regularly. Ask your doctor to suggest some good activities for you. Try one of these options: ? 150 minutes a week of gentle exercise, like walking or yoga. ? 75 minutes a week of exercise that is very active, like running or swimming. ? A combination of gentle exercise and very active exercise.  Do home treatments to slow down your heartbeat as told by your doctor.  Take over-the-counter and prescription medicines only as told by your doctor. Contact a doctor if:  You have a fast heartbeat more often.  Times of having a fast heartbeat last longer than before.  Your home treatments to slow down your heartbeat do not help.  You have new symptoms. Get help right away if:  You have chest pain.  Your symptoms get worse.  You have trouble breathing.  Your heart beats very fast for more than 20 minutes.  You pass out (faint). These symptoms may be an emergency. Do not wait to see if the symptoms will go away. Get medical help right away. Call your local emergency services (911 in the [place] Do not drive yourself to the hospital. This information is not intended to replace advice given to you by your health care provider. Make sure you discuss any questions you have with your health care provider. Document Released: 12/20/2004  Document Revised: 08/27/2015 Document Reviewed: 08/27/2015 Elsevier Interactive Patient Education  2017 [employer].

## 2017-05-11 ENCOUNTER — Ambulatory Visit
Admission: RE | Admit: 2017-05-11 | Discharge: 2017-05-11 | Disposition: A | Discharge status: Home / Self Care | Payer: 59 | Source: Ambulatory Visit | Attending: Internal Medicine | Admitting: Internal Medicine

## 2017-05-11 ENCOUNTER — Telehealth: Payer: Self-pay

## 2017-05-11 ENCOUNTER — Encounter: Payer: Self-pay | Admitting: Internal Medicine

## 2017-05-11 DIAGNOSIS — R0609 Other forms of dyspnea: Secondary | ICD-10-CM | POA: Insufficient documentation

## 2017-05-11 DIAGNOSIS — I471 Supraventricular tachycardia, unspecified: Secondary | ICD-10-CM | POA: Insufficient documentation

## 2017-05-11 DIAGNOSIS — R911 Solitary pulmonary nodule: Secondary | ICD-10-CM | POA: Diagnosis not present

## 2017-05-11 DIAGNOSIS — IMO0001 Reserved for inherently not codable concepts without codable children: Secondary | ICD-10-CM

## 2017-05-11 MED ORDER — IOPAMIDOL (ISOVUE-370) INJECTION 76%
75.0000 mL | Freq: Once | INTRAVENOUS | Status: AC | PRN
  Administered 2017-05-11: 75 mL via INTRAVENOUS

## 2017-05-11 NOTE — Assessment & Plan Note (Addendum)
Occurring in the setting of recent travel, obesity, untreated OSA .  I have reviewed her EKG today and she does NOT appear to be in atrial fibrillation as stated on readout, although her husband states that his I phone watch has recorded an irregular heart beat several times over the past week. Her lung exam is normal and she has no signs of heart failure on exam.  Her BNP is normal but  She has an extremely elevated D Dimer, (unfortunately I did not receive an after hours call, or ANY call  for critical lab result).  will order CT angiogram to rule out PE.  Metoprolol was started at low dose and cardiology consult will be made to her former cardiologist Arnoldo Hooker if her  CT is negative for PE

## 2017-05-11 NOTE — Telephone Encounter (Signed)
[name] came over and stated that Dr. Darrick Frye had ordered a stat CT scan on the pt and she had sent her a mychart message in regards to her lab results and why the CT scan needed to be done. The pt had not read the mychart message so I called the pt to let her know of her lab results. Also explained to the pt that she needs to have a stat CT scan because she had an elevated D-dimer and that can be a sign that there is a blood clot and that is why Dr. Darrick Frye has ordered the CT scan. The pt gave a verbal understanding. Stated to the pt that she would be getting a call from [facility] here in our office to let her know of her appt date and time. Pt stated that [name] could call her on her cell phone and she would answer. [name] was made aware.

## 2017-05-12 NOTE — Assessment & Plan Note (Signed)
6 month follow up advised.

## 2017-05-17 ENCOUNTER — Ambulatory Visit (INDEPENDENT_AMBULATORY_CARE_PROVIDER_SITE_OTHER): Payer: 59 | Admitting: Psychology

## 2017-05-17 DIAGNOSIS — F4311 Post-traumatic stress disorder, acute: Secondary | ICD-10-CM | POA: Diagnosis not present

## 2017-05-17 DIAGNOSIS — F4481 Dissociative identity disorder: Secondary | ICD-10-CM

## 2017-05-23 ENCOUNTER — Other Ambulatory Visit: Payer: Self-pay | Admitting: Internal Medicine

## 2017-05-24 ENCOUNTER — Ambulatory Visit (INDEPENDENT_AMBULATORY_CARE_PROVIDER_SITE_OTHER): Payer: 59 | Admitting: Psychology

## 2017-05-24 DIAGNOSIS — F4311 Post-traumatic stress disorder, acute: Secondary | ICD-10-CM

## 2017-05-24 DIAGNOSIS — F4481 Dissociative identity disorder: Secondary | ICD-10-CM | POA: Diagnosis not present

## 2017-05-31 ENCOUNTER — Ambulatory Visit (INDEPENDENT_AMBULATORY_CARE_PROVIDER_SITE_OTHER): Payer: 59 | Admitting: Psychology

## 2017-05-31 DIAGNOSIS — F4311 Post-traumatic stress disorder, acute: Secondary | ICD-10-CM | POA: Diagnosis not present

## 2017-05-31 DIAGNOSIS — F4481 Dissociative identity disorder: Secondary | ICD-10-CM | POA: Diagnosis not present

## 2017-06-14 ENCOUNTER — Ambulatory Visit (INDEPENDENT_AMBULATORY_CARE_PROVIDER_SITE_OTHER): Payer: 59 | Admitting: Psychology

## 2017-06-14 DIAGNOSIS — F4481 Dissociative identity disorder: Secondary | ICD-10-CM | POA: Diagnosis not present

## 2017-06-14 DIAGNOSIS — F4311 Post-traumatic stress disorder, acute: Secondary | ICD-10-CM | POA: Diagnosis not present

## 2017-06-21 ENCOUNTER — Ambulatory Visit (INDEPENDENT_AMBULATORY_CARE_PROVIDER_SITE_OTHER): Payer: 59 | Admitting: Psychology

## 2017-06-21 DIAGNOSIS — F4311 Post-traumatic stress disorder, acute: Secondary | ICD-10-CM

## 2017-06-21 DIAGNOSIS — F4481 Dissociative identity disorder: Secondary | ICD-10-CM | POA: Diagnosis not present

## 2017-06-24 ENCOUNTER — Other Ambulatory Visit: Payer: Self-pay | Admitting: Internal Medicine

## 2017-06-28 ENCOUNTER — Ambulatory Visit (INDEPENDENT_AMBULATORY_CARE_PROVIDER_SITE_OTHER): Payer: 59 | Admitting: Psychology

## 2017-06-28 DIAGNOSIS — F4311 Post-traumatic stress disorder, acute: Secondary | ICD-10-CM

## 2017-06-28 DIAGNOSIS — F4481 Dissociative identity disorder: Secondary | ICD-10-CM

## 2017-07-05 ENCOUNTER — Ambulatory Visit: Payer: 59 | Admitting: Psychology

## 2017-07-12 ENCOUNTER — Ambulatory Visit (INDEPENDENT_AMBULATORY_CARE_PROVIDER_SITE_OTHER): Payer: 59 | Admitting: Psychology

## 2017-07-12 DIAGNOSIS — F4311 Post-traumatic stress disorder, acute: Secondary | ICD-10-CM | POA: Diagnosis not present

## 2017-07-12 DIAGNOSIS — F4481 Dissociative identity disorder: Secondary | ICD-10-CM | POA: Diagnosis not present

## 2017-07-14 ENCOUNTER — Other Ambulatory Visit: Payer: Self-pay | Admitting: Internal Medicine

## 2017-07-14 DIAGNOSIS — N3281 Overactive bladder: Secondary | ICD-10-CM

## 2017-07-19 ENCOUNTER — Ambulatory Visit (INDEPENDENT_AMBULATORY_CARE_PROVIDER_SITE_OTHER): Payer: 59 | Admitting: Psychology

## 2017-07-19 DIAGNOSIS — F4311 Post-traumatic stress disorder, acute: Secondary | ICD-10-CM

## 2017-07-19 DIAGNOSIS — F4481 Dissociative identity disorder: Secondary | ICD-10-CM

## 2017-07-26 ENCOUNTER — Ambulatory Visit (INDEPENDENT_AMBULATORY_CARE_PROVIDER_SITE_OTHER): Payer: 59 | Admitting: Psychology

## 2017-07-26 DIAGNOSIS — F4311 Post-traumatic stress disorder, acute: Secondary | ICD-10-CM

## 2017-07-26 DIAGNOSIS — F4481 Dissociative identity disorder: Secondary | ICD-10-CM | POA: Diagnosis not present

## 2017-08-02 ENCOUNTER — Ambulatory Visit (INDEPENDENT_AMBULATORY_CARE_PROVIDER_SITE_OTHER): Payer: 59 | Admitting: Psychology

## 2017-08-02 DIAGNOSIS — F4311 Post-traumatic stress disorder, acute: Secondary | ICD-10-CM | POA: Diagnosis not present

## 2017-08-02 DIAGNOSIS — F4481 Dissociative identity disorder: Secondary | ICD-10-CM | POA: Diagnosis not present

## 2017-08-09 ENCOUNTER — Ambulatory Visit: Payer: 59 | Admitting: Psychology

## 2017-08-16 ENCOUNTER — Ambulatory Visit (INDEPENDENT_AMBULATORY_CARE_PROVIDER_SITE_OTHER): Payer: 59 | Admitting: Psychology

## 2017-08-16 ENCOUNTER — Other Ambulatory Visit: Payer: Self-pay

## 2017-08-16 DIAGNOSIS — F4311 Post-traumatic stress disorder, acute: Secondary | ICD-10-CM

## 2017-08-16 DIAGNOSIS — F4481 Dissociative identity disorder: Secondary | ICD-10-CM

## 2017-08-16 MED ORDER — METOPROLOL SUCCINATE ER 25 MG PO TB24: 25.0000 mg | ORAL_TABLET | Freq: Every day | ORAL | 1 refills | Status: DC

## 2017-08-23 ENCOUNTER — Other Ambulatory Visit: Payer: Self-pay | Admitting: Internal Medicine

## 2017-08-30 ENCOUNTER — Ambulatory Visit: Payer: 59 | Admitting: Psychology

## 2017-09-06 ENCOUNTER — Ambulatory Visit (INDEPENDENT_AMBULATORY_CARE_PROVIDER_SITE_OTHER): Payer: 59 | Admitting: Psychology

## 2017-09-06 DIAGNOSIS — F4481 Dissociative identity disorder: Secondary | ICD-10-CM | POA: Diagnosis not present

## 2017-09-06 DIAGNOSIS — F4311 Post-traumatic stress disorder, acute: Secondary | ICD-10-CM | POA: Diagnosis not present

## 2017-09-27 ENCOUNTER — Ambulatory Visit (INDEPENDENT_AMBULATORY_CARE_PROVIDER_SITE_OTHER): Payer: 59 | Admitting: Psychology

## 2017-09-27 DIAGNOSIS — F4311 Post-traumatic stress disorder, acute: Secondary | ICD-10-CM | POA: Diagnosis not present

## 2017-09-27 DIAGNOSIS — F4481 Dissociative identity disorder: Secondary | ICD-10-CM

## 2017-09-28 ENCOUNTER — Other Ambulatory Visit: Payer: Self-pay | Admitting: Family

## 2017-10-11 ENCOUNTER — Ambulatory Visit: Payer: 59 | Admitting: Psychology

## 2017-10-25 ENCOUNTER — Ambulatory Visit (INDEPENDENT_AMBULATORY_CARE_PROVIDER_SITE_OTHER): Payer: 59 | Admitting: Psychology

## 2017-10-25 ENCOUNTER — Other Ambulatory Visit: Payer: Self-pay | Admitting: Internal Medicine

## 2017-10-25 DIAGNOSIS — F4311 Post-traumatic stress disorder, acute: Secondary | ICD-10-CM

## 2017-10-25 DIAGNOSIS — F4481 Dissociative identity disorder: Secondary | ICD-10-CM

## 2017-11-07 ENCOUNTER — Other Ambulatory Visit: Payer: Self-pay | Admitting: Obstetrics and Gynecology

## 2017-11-07 DIAGNOSIS — N644 Mastodynia: Secondary | ICD-10-CM

## 2017-11-08 ENCOUNTER — Ambulatory Visit (INDEPENDENT_AMBULATORY_CARE_PROVIDER_SITE_OTHER): Payer: 59 | Admitting: Psychology

## 2017-11-08 ENCOUNTER — Ambulatory Visit: Payer: 59 | Admitting: Psychology

## 2017-11-08 DIAGNOSIS — F4481 Dissociative identity disorder: Secondary | ICD-10-CM

## 2017-11-20 ENCOUNTER — Ambulatory Visit
Admission: RE | Admit: 2017-11-20 | Discharge: 2017-11-20 | Disposition: A | Discharge status: Home / Self Care | Payer: 59 | Source: Ambulatory Visit | Attending: Obstetrics and Gynecology | Admitting: Obstetrics and Gynecology

## 2017-11-20 ENCOUNTER — Other Ambulatory Visit: Payer: Self-pay | Admitting: Obstetrics and Gynecology

## 2017-11-20 DIAGNOSIS — N63 Unspecified lump in unspecified breast: Secondary | ICD-10-CM | POA: Insufficient documentation

## 2017-11-20 DIAGNOSIS — N644 Mastodynia: Secondary | ICD-10-CM

## 2017-11-22 ENCOUNTER — Ambulatory Visit (INDEPENDENT_AMBULATORY_CARE_PROVIDER_SITE_OTHER): Payer: 59 | Admitting: Psychology

## 2017-11-22 DIAGNOSIS — F4311 Post-traumatic stress disorder, acute: Secondary | ICD-10-CM

## 2017-11-22 DIAGNOSIS — F4481 Dissociative identity disorder: Secondary | ICD-10-CM | POA: Diagnosis not present

## 2017-11-26 ENCOUNTER — Other Ambulatory Visit: Payer: Self-pay | Admitting: Internal Medicine

## 2017-11-27 ENCOUNTER — Other Ambulatory Visit: Payer: Self-pay | Admitting: Internal Medicine

## 2017-11-27 ENCOUNTER — Telehealth: Payer: Self-pay | Admitting: Internal Medicine

## 2017-11-27 DIAGNOSIS — R911 Solitary pulmonary nodule: Secondary | ICD-10-CM

## 2017-11-27 DIAGNOSIS — IMO0001 Reserved for inherently not codable concepts without codable children: Secondary | ICD-10-CM

## 2017-11-27 MED ORDER — GABAPENTIN 300 MG PO CAPS
300.0000 mg | ORAL_CAPSULE | Freq: Three times a day (TID) | ORAL | 3 refills | Status: DC
Start: 2017-11-27 — End: 2021-09-27

## 2017-11-27 NOTE — Telephone Encounter (Signed)
Chest CT has been ordered

## 2017-11-27 NOTE — Telephone Encounter (Signed)
Please cancel gabapentin sent ot local pharmacy  You will need to call them.  It has been cancelled in [place] only.  New rx for 300 mg capsule (instead of 100 mg )  Capsule,  rx  has been  sent to mail order  REMIND PATIENT THAT  Her DOSE IS NOW 1 CAPSULE THREE TIMES DAILY

## 2017-11-27 NOTE — Telephone Encounter (Signed)
I called & canceled prescription with [facility]. I also called patient to make sure she was aware of new dosing instructions. Patient asked that she thought she was supposed to have another CT scan done of her chest 6 months after they found lung nodule on 05/11/17 to follow up. She was wondering if that can/should be ordered?

## 2017-11-27 NOTE — Assessment & Plan Note (Signed)
6 month follow up on LUL nodule needed,  chest CT ordered

## 2017-11-28 NOTE — Telephone Encounter (Signed)
I have called patient to let her know that this has been ordered. Will you get this scheduled for patient?

## 2017-12-05 NOTE — Telephone Encounter (Signed)
[facility] It is requiring additional Medical Review. I will get this scheduled as soon as they give me authorization or will let you know if they deny it. Thanks, [employer] MillsMelissa

## 2017-12-06 ENCOUNTER — Ambulatory Visit (INDEPENDENT_AMBULATORY_CARE_PROVIDER_SITE_OTHER): Payer: 59 | Admitting: Psychology

## 2017-12-06 DIAGNOSIS — F4311 Post-traumatic stress disorder, acute: Secondary | ICD-10-CM | POA: Diagnosis not present

## 2017-12-06 DIAGNOSIS — F4481 Dissociative identity disorder: Secondary | ICD-10-CM

## 2017-12-11 ENCOUNTER — Telehealth: Payer: Self-pay

## 2017-12-11 NOTE — Telephone Encounter (Signed)
Copied from CRM [id number]. Topic: Referral - Status >> Dec 11, 2017  3:38 PM Floria RavelingStovall, Shana A wrote: Reason for CRM: pt husband called in and would like to know the status of the MRI/CAT Scan/Imaging.  He stated he rec'd a letter in the mail that it was approved?  Please advise.    Best number [phone]

## 2017-12-17 ENCOUNTER — Other Ambulatory Visit: Payer: Self-pay | Admitting: Internal Medicine

## 2017-12-20 ENCOUNTER — Ambulatory Visit: Payer: 59 | Admitting: Psychology

## 2017-12-20 ENCOUNTER — Ambulatory Visit
Admission: RE | Admit: 2017-12-20 | Discharge: 2017-12-20 | Disposition: A | Discharge status: Home / Self Care | Payer: 59 | Source: Ambulatory Visit | Attending: Internal Medicine | Admitting: Internal Medicine

## 2017-12-20 DIAGNOSIS — R911 Solitary pulmonary nodule: Secondary | ICD-10-CM | POA: Insufficient documentation

## 2017-12-20 DIAGNOSIS — IMO0001 Reserved for inherently not codable concepts without codable children: Secondary | ICD-10-CM

## 2018-01-17 ENCOUNTER — Ambulatory Visit (INDEPENDENT_AMBULATORY_CARE_PROVIDER_SITE_OTHER): Payer: No Typology Code available for payment source | Admitting: Psychology

## 2018-01-17 DIAGNOSIS — F4311 Post-traumatic stress disorder, acute: Secondary | ICD-10-CM

## 2018-01-17 DIAGNOSIS — F4481 Dissociative identity disorder: Secondary | ICD-10-CM

## 2018-01-31 ENCOUNTER — Ambulatory Visit (INDEPENDENT_AMBULATORY_CARE_PROVIDER_SITE_OTHER): Payer: No Typology Code available for payment source | Admitting: Psychology

## 2018-01-31 DIAGNOSIS — F4481 Dissociative identity disorder: Secondary | ICD-10-CM | POA: Diagnosis not present

## 2018-01-31 DIAGNOSIS — F4311 Post-traumatic stress disorder, acute: Secondary | ICD-10-CM

## 2018-02-14 ENCOUNTER — Ambulatory Visit (INDEPENDENT_AMBULATORY_CARE_PROVIDER_SITE_OTHER): Payer: No Typology Code available for payment source | Admitting: Psychology

## 2018-02-14 DIAGNOSIS — F4311 Post-traumatic stress disorder, acute: Secondary | ICD-10-CM

## 2018-02-14 DIAGNOSIS — F4481 Dissociative identity disorder: Secondary | ICD-10-CM | POA: Diagnosis not present

## 2018-02-28 ENCOUNTER — Ambulatory Visit: Payer: 59 | Admitting: Psychology

## 2018-03-07 ENCOUNTER — Other Ambulatory Visit: Payer: Self-pay | Admitting: Internal Medicine

## 2018-03-07 MED ORDER — LEVOTHYROXINE SODIUM 88 MCG PO TABS
88.0000 ug | ORAL_TABLET | Freq: Every day | ORAL | 0 refills | Status: DC
Start: 2018-03-07 — End: 2018-05-09

## 2018-03-07 NOTE — Telephone Encounter (Signed)
Copied from CRM [id number]. Topic: Quick Communication - Rx Refill/Question >> Mar 07, 2018 10:05 AM Jolayne Haines L wrote: Medication: levothyroxine (SYNTHROID, LEVOTHROID) 88 MCG tablet  Has the patient contacted their pharmacy? Yes, one week ago (Agent: If no, request that the patient contact the pharmacy for the refill.) (Agent: If yes, when and what did the pharmacy advise?)  Preferred Pharmacy (with phone number or street name): [facility] - [place], [place] - [address] [place] [address] [place] [address] Phone: [phone] Fax: [phone]    Agent: Please be advised that RX refills may take up to 3 business days. We ask that you follow-up with your pharmacy.

## 2018-03-14 ENCOUNTER — Ambulatory Visit (INDEPENDENT_AMBULATORY_CARE_PROVIDER_SITE_OTHER): Payer: No Typology Code available for payment source | Admitting: Psychology

## 2018-03-14 DIAGNOSIS — F4311 Post-traumatic stress disorder, acute: Secondary | ICD-10-CM

## 2018-03-14 DIAGNOSIS — F4481 Dissociative identity disorder: Secondary | ICD-10-CM | POA: Diagnosis not present

## 2018-03-18 ENCOUNTER — Other Ambulatory Visit: Payer: Self-pay | Admitting: Internal Medicine

## 2018-03-28 ENCOUNTER — Ambulatory Visit: Payer: No Typology Code available for payment source | Admitting: Psychology

## 2018-04-05 ENCOUNTER — Ambulatory Visit (INDEPENDENT_AMBULATORY_CARE_PROVIDER_SITE_OTHER): Payer: Medicare Other | Admitting: Psychology

## 2018-04-05 DIAGNOSIS — F4481 Dissociative identity disorder: Secondary | ICD-10-CM

## 2018-04-05 DIAGNOSIS — F4311 Post-traumatic stress disorder, acute: Secondary | ICD-10-CM | POA: Diagnosis not present

## 2018-04-11 ENCOUNTER — Ambulatory Visit (INDEPENDENT_AMBULATORY_CARE_PROVIDER_SITE_OTHER): Payer: Medicare Other | Admitting: Psychology

## 2018-04-11 DIAGNOSIS — F4311 Post-traumatic stress disorder, acute: Secondary | ICD-10-CM

## 2018-04-11 DIAGNOSIS — F4481 Dissociative identity disorder: Secondary | ICD-10-CM | POA: Diagnosis not present

## 2018-04-25 ENCOUNTER — Other Ambulatory Visit: Payer: Self-pay | Admitting: Internal Medicine

## 2018-04-25 ENCOUNTER — Ambulatory Visit (INDEPENDENT_AMBULATORY_CARE_PROVIDER_SITE_OTHER): Payer: Medicare Other | Admitting: Psychology

## 2018-04-25 DIAGNOSIS — F4481 Dissociative identity disorder: Secondary | ICD-10-CM

## 2018-04-25 DIAGNOSIS — F4311 Post-traumatic stress disorder, acute: Secondary | ICD-10-CM

## 2018-04-27 ENCOUNTER — Encounter: Payer: Self-pay | Admitting: Family Medicine

## 2018-04-27 ENCOUNTER — Ambulatory Visit (INDEPENDENT_AMBULATORY_CARE_PROVIDER_SITE_OTHER): Payer: Medicare Other | Admitting: Family Medicine

## 2018-04-27 ENCOUNTER — Other Ambulatory Visit: Payer: Self-pay

## 2018-04-27 ENCOUNTER — Telehealth: Payer: Self-pay | Admitting: Family Medicine

## 2018-04-27 DIAGNOSIS — R6889 Other general symptoms and signs: Secondary | ICD-10-CM | POA: Diagnosis not present

## 2018-04-27 DIAGNOSIS — R0600 Dyspnea, unspecified: Secondary | ICD-10-CM

## 2018-04-27 DIAGNOSIS — Z20822 Contact with and (suspected) exposure to covid-19: Secondary | ICD-10-CM

## 2018-04-27 DIAGNOSIS — Z20828 Contact with and (suspected) exposure to other viral communicable diseases: Secondary | ICD-10-CM | POA: Diagnosis not present

## 2018-04-27 MED ORDER — ALBUTEROL SULFATE HFA 108 (90 BASE) MCG/ACT IN AERS: 2.0000 | INHALATION_SPRAY | Freq: Four times a day (QID) | RESPIRATORY_TRACT | 2 refills | Status: DC | PRN

## 2018-04-27 NOTE — Progress Notes (Signed)
Patient ID: [name], female   DOB: 22-Mar-1955, 63 y.o.   MRN: [id number]  Virtual Visit via video Note  This visit type was conducted due to national recommendations for restrictions regarding the COVID-19 pandemic (e.g. social distancing).  This format is felt to be most appropriate for this patient at this time.  All issues noted in this document were discussed and addressed.  No physical exam was performed (except for noted visual exam findings with Video Visits).   I connected with Miguel Rota on 04/27/18 at  9:20 AM EDT by a video enabled telemedicine application or telephone and verified that I am speaking with the correct person using two identifiers. Location patient: home Location provider: [facility] Persons participating in the virtual visit: patient, provider  I discussed the limitations, risks, security and privacy concerns of performing an evaluation and management service by video and the availability of in person appointments. I also discussed with the patient that there may be a patient responsible charge related to this service. The patient expressed understanding and agreed to proceed.  HPI:  Patient and I connected via video today due to complaint of shortness of breath, body aches and cough.  Patient states she is feeling better today, but on Tuesday, Wednesday and Thursday of this week she felt so ill she did not want to get out of bed.    Patient states she discussed her symptoms with her psychiatrist who told her it was possible she did have COVID-19.  Patient states her daughter is a Engineer, civil (consulting) in the ICU, and will intermittently lives in her home continues to work with the public, so she believes it is possible she was exposed to COVID-19.  Patient states she is very concerned & anxious about succumbing to COVID-19 due to her multiple medical issues.  Patient does not feel she is gasping or struggling for air.  States there are times she will feel like she cannot get  a full breath in.  Denies dry cough or productive cough.  Denies chest pain.  Denies GI or GU issues.  She has been eating and drinking normally.   ROS: See pertinent positives and negatives per HPI.  Past Medical History:  Diagnosis Date  . Anxiety   . Arthritis   . Atopic dermatitis    scalp  . Depression   . Hyperlipidemia   . Hypertension    pt denies  . Thyroid disease   . Tobacco abuse     Past Surgical History:  Procedure Laterality Date  . LUMBAR DISC SURGERY    . ROTATOR CUFF REPAIR Bilateral     Family History  Problem Relation Age of Onset  . Arthritis Mother   . Macular degeneration Mother   . Heart disease Father   . Breast cancer Neg Hx     Social History   Tobacco Use  . Smoking status: Former Smoker    Packs/day: 0.25    Types: Cigarettes    Last attempt to quit: 01/02/2016    Years since quitting: 2.3  . Smokeless tobacco: Never Used  Substance Use Topics  . Alcohol use: Yes    Comment: once a week     Current Outpatient Medications:  .  albuterol (VENTOLIN HFA) 108 (90 Base) MCG/ACT inhaler, Inhale 2 puffs into the lungs every 6 (six) hours as needed for wheezing or shortness of breath., Disp: 1 Inhaler, Rfl: 2 .  clonazePAM (KLONOPIN) 0.5 MG tablet, Take 0.5 mg by mouth 2 (two) times  daily., Disp: , Rfl:  .  ergocalciferol (VITAMIN D2) 50000 units capsule, Take 1 capsule (50,000 Units total) by mouth once a week., Disp: 4 capsule, Rfl: 2 .  famotidine (PEPCID) 20 MG tablet, TAKE 1 TABLET (20 MG TOTAL) BY MOUTH DAILY. WITH BREAKFAST, Disp: 90 tablet, Rfl: 3 .  gabapentin (NEURONTIN) 300 MG capsule, Take 1 capsule (300 mg total) by mouth 3 (three) times daily., Disp: 90 capsule, Rfl: 3 .  levothyroxine (SYNTHROID, LEVOTHROID) 88 MCG tablet, Take 1 tablet (88 mcg total) by mouth daily., Disp: 90 tablet, Rfl: 0 .  lurasidone (LATUDA) 20 MG TABS tablet, Take by mouth daily., Disp: , Rfl:  .  metoprolol succinate (TOPROL-XL) 25 MG 24 hr tablet,  TAKE 1 TABLET BY MOUTH  DAILY, Disp: 90 tablet, Rfl: 1 .  oxybutynin (DITROPAN) 5 MG tablet, TAKE 1/2 OF A TABLET BY MOUTH TWICE A DAY, Disp: 90 tablet, Rfl: 1 .  ropinirole (REQUIP) 5 MG tablet, Take 5 mg by mouth at bedtime., Disp: , Rfl: 0 .  tizanidine (ZANAFLEX) 6 MG capsule, , Disp: , Rfl:  .  tizanidine (ZANAFLEX) 6 MG capsule, TAKE 1 CAPSULE BY MOUTH THREE TIMES A DAY AS NEEDED FOR MUSCLE SPASMS, Disp: 60 capsule, Rfl: 2 .  Vilazodone HCl (VIIBRYD) 40 MG TABS, Take by mouth daily., Disp: , Rfl:   EXAM:  GENERAL: alert, oriented, appears well and in no acute distress  HEENT: atraumatic, conjunttiva clear, no obvious abnormalities on inspection of external nose and ears  NECK: normal movements of the head and neck  LUNGS: on inspection no signs of respiratory distress, breathing rate appears normal, no obvious gross SOB, gasping or wheezing  CV: no obvious cyanosis  MS: moves all visible extremities without noticeable abnormality  PSYCH/NEURO: pleasant and cooperative, is anxious about health/covid19, speech and thought processing grossly intact  ASSESSMENT AND PLAN:  Discussed the following assessment and plan:  Suspected Covid-19 Virus Infection  Dyspnea, unspecified type - Plan: albuterol (VENTOLIN HFA) 108 (90 Base) MCG/ACT inhaler  Due to patient's described symptoms, it is possible she has COVID-19 infection.  Advised that she needs to remain on a self quarantine for minimum of 7 days from the onset of her symptoms.  Also advised to be sure she is fever free for at least 72 hours without the use of Tylenol Motrin prior to ending self quarantine.  Advised patient that I will send an inhaler for her to use if needed for any shortness of breath.  Advised to increase fluid intake, get plenty of rest.  Advised after her self quarantine time.  Stop, to be sure she wears a mask when going out of the home.  Also discussed diligent handwashing.   I discussed the assessment and  treatment plan with the patient. The patient was provided an opportunity to ask questions and all were answered. The patient agreed with the plan and demonstrated an understanding of the instructions.   The patient was advised to call back or seek an in-person evaluation if the symptoms worsen or if the condition fails to improve as anticipated.  I provided 25 minutes of video face-to-face time during this encounter discussing plan of care in regards to suspected COVID-19 virus infection, giving her reassurance, advising that her symptoms are mild that the best plan of care is to remain home, do supportive care and monitor for any worsening or changes.  Tracey HarriesLauren M Maclean Foister, FNP

## 2018-04-27 NOTE — Telephone Encounter (Signed)
This person has new insurance  I asked her to send [place] a Clinical cytogeneticist message with a photo of front and back of the card so we can update in the system  Please make sure to follow up so we can bill her visit appropriately  Thanks!  LG

## 2018-05-08 ENCOUNTER — Other Ambulatory Visit: Payer: Self-pay | Admitting: Internal Medicine

## 2018-05-08 DIAGNOSIS — I10 Essential (primary) hypertension: Secondary | ICD-10-CM

## 2018-05-08 DIAGNOSIS — E7800 Pure hypercholesterolemia, unspecified: Secondary | ICD-10-CM

## 2018-05-08 DIAGNOSIS — E1159 Type 2 diabetes mellitus with other circulatory complications: Secondary | ICD-10-CM

## 2018-05-08 DIAGNOSIS — I152 Hypertension secondary to endocrine disorders: Secondary | ICD-10-CM

## 2018-05-08 DIAGNOSIS — E1169 Type 2 diabetes mellitus with other specified complication: Secondary | ICD-10-CM

## 2018-05-08 DIAGNOSIS — E034 Atrophy of thyroid (acquired): Secondary | ICD-10-CM

## 2018-05-08 DIAGNOSIS — E669 Obesity, unspecified: Secondary | ICD-10-CM

## 2018-05-08 NOTE — Telephone Encounter (Signed)
Refilled: 03/07/2018 Last OV: 05/09/2017 Next OV: not scheduled Last TSH: 05/09/2017

## 2018-05-09 ENCOUNTER — Ambulatory Visit (INDEPENDENT_AMBULATORY_CARE_PROVIDER_SITE_OTHER): Payer: Medicare Other | Admitting: Psychology

## 2018-05-09 DIAGNOSIS — F4481 Dissociative identity disorder: Secondary | ICD-10-CM

## 2018-05-09 DIAGNOSIS — F4311 Post-traumatic stress disorder, acute: Secondary | ICD-10-CM

## 2018-05-17 ENCOUNTER — Telehealth: Payer: Self-pay | Admitting: *Deleted

## 2018-05-17 ENCOUNTER — Ambulatory Visit (INDEPENDENT_AMBULATORY_CARE_PROVIDER_SITE_OTHER): Payer: Medicare Other | Admitting: Internal Medicine

## 2018-05-17 ENCOUNTER — Other Ambulatory Visit: Payer: Self-pay

## 2018-05-17 ENCOUNTER — Ambulatory Visit: Payer: Self-pay | Admitting: *Deleted

## 2018-05-17 DIAGNOSIS — Z7189 Other specified counseling: Secondary | ICD-10-CM

## 2018-05-17 DIAGNOSIS — Z20822 Contact with and (suspected) exposure to covid-19: Secondary | ICD-10-CM

## 2018-05-17 DIAGNOSIS — I341 Nonrheumatic mitral (valve) prolapse: Secondary | ICD-10-CM | POA: Diagnosis not present

## 2018-05-17 DIAGNOSIS — R6889 Other general symptoms and signs: Secondary | ICD-10-CM | POA: Diagnosis not present

## 2018-05-17 DIAGNOSIS — J309 Allergic rhinitis, unspecified: Secondary | ICD-10-CM

## 2018-05-17 MED ORDER — OLOPATADINE HCL 0.2 % OP SOLN: 1.0000 [drp] | Freq: Every day | OPHTHALMIC | 0 refills | Status: DC

## 2018-05-17 NOTE — Telephone Encounter (Signed)
Physician requested covid-19 test. Scheduled appointment at [facility] site for 05/18/18 at 11 am. Pt made aware of appointment and reminded to wear masks and keep window up until testing done.

## 2018-05-17 NOTE — Telephone Encounter (Signed)
Pt has appt with Dr. Lonie Peak today at 4pm

## 2018-05-17 NOTE — Telephone Encounter (Signed)
FYI

## 2018-05-17 NOTE — Telephone Encounter (Signed)
Pt called with not feeling well since about March. She stated that she has been feeling fatigued, headache, runny nose, elevated temperature and cough.  She feels worst today than yesterday.  She has not been out of the house much but she has a son that works in a factory Museum/gallery exhibitions officer) and he does not wear a mask at work but does Transport planner.  She wonders if she is drinking enough fluids. She has also lost the sense of smell, has sore throat and itchy eyes.  She is requesting an appointment. Advised that if she starts having chest pain or respiratory distress she needs to be seen more urgently and to call 911. Advised of having a virtual appointment.  Pt voiced understanding. Flow at [facility] notified regarding an appointment. Call conference in with patient. Routed to flow at [facility] at [facility] for review.  Reason for Disposition . HIGH RISK patient (e.g., age > 64 years, diabetes, heart or lung disease, weak immune system)  Answer Assessment - Initial Assessment Questions 1. COVID-19 DIAGNOSIS: "Who made your Coronavirus (COVID-19) diagnosis?" "Was it confirmed by a positive lab test?" If not diagnosed by a HCP, ask "Are there lots of cases (community spread) where you live?" (See public health department website, if unsure)   * MAJOR community spread: high number of cases; numbers of cases are increasing; many people hospitalized.   * MINOR community spread: low number of cases; not increasing; few or no people hospitalized     In the community 2. ONSET: "When did the COVID-19 symptoms start?"      Off and on feeling bad since March 3. WORST SYMPTOM: "What is your worst symptom?" (e.g., cough, fever, shortness of breath, muscle aches)     Fatigue and headaches 4. COUGH: "Do you have a cough?" If so, ask: "How bad is the cough?"       Has had a cough 5. FEVER: "Do you have a fever?" If so, ask: "What is your temperature, how was it measured, and when did it  start?"     Low grade fever, 98.8 has been up to 100  A few weeks ago 6. RESPIRATORY STATUS: "Describe your breathing?" (e.g., shortness of breath, wheezing, unable to speak)      Not sure, ,maybe allergies 7. BETTER-SAME-WORSE: "Are you getting better, staying the same or getting worse compared to yesterday?"  If getting worse, ask, "In what way?"     Feels worst today than yesterday 8. HIGH RISK DISEASE: "Do you have any chronic medical problems?" (e.g., asthma, heart or lung disease, weak immune system, etc.)     Mitral valve prolapse, thinks she has a weak immune system 9. PREGNANCY: "Is there any chance you are pregnant?" "When was your last menstrual period?"     n/a 10. OTHER SYMPTOMS: "Do you have any other symptoms?"  (e.g., runny nose, headache, sore throat, loss of smell)       Runny nose, headache, sore throat, eyes itchy, losing sense of smell  Protocols used: CORONAVIRUS (COVID-19) DIAGNOSED OR SUSPECTED-A-AH

## 2018-05-17 NOTE — Progress Notes (Signed)
Virtual Visit via Video Note  I connected with [name]   on 05/17/18 at  4:12 PM EDT by a video enabled telemedicine application and verified that I am speaking with the correct person using two identifiers.  Location patient: home Location provider:work  Persons participating in the virtual visit: patient, provider, pts husband   I discussed the limitations of evaluation and management by telemedicine and the availability of in person appointments. The patient expressed understanding and agreed to proceed.   HPI: 1. Patient called 04/27/2018 and was evaluated. She still reports fatigue worsening, frontal/sides of temples tried Tylenol which helps 5/10 h/a, temperature of 99 F normally 97. She does have allergies to hay fever/mold mildew and has tried benadryl to help with sx's previously zyrtec did not help. She also c/o itchy eyes and sore throat. She has not tried anything for sore throat. She also reports dry cough and denies sob but feels like her breathing is more shallow than normal.  She also reports reduce sense of smell and taste. She also reports the skin on her legs feels different than normal and she felt her feet looked more white to purple in color but on video today feet do not appear purple. She reports she hasnt gotten out of bed in 3 days and feels dizzy at times   Her son works in a factory does not wear a mask and is exposed to the public and lives with her and her daughter is an Chief Executive Officer (does not live with her) who she saw for Mothers day and he daughter felt sick with high fever 3 weeks ago was tested for COVID 19 and negative   She and husband requesting COVID 19 test for her.   She does report racing heart and increased anxiety   Underlying conditions pt reports MVP, she denies smoking currently. CT chest 12/20/17 lung nodule 5 mm and scarring   ROS: See pertinent positives and negatives per HPI. HEENT: denies sinus pain and ear pain  Past Medical History:   Diagnosis Date  . Anxiety   . Arthritis   . Atopic dermatitis    scalp  . Depression   . Hyperlipidemia   . Hypertension    pt denies  . Thyroid disease   . Tobacco abuse     Past Surgical History:  Procedure Laterality Date  . LUMBAR DISC SURGERY    . ROTATOR CUFF REPAIR Bilateral     Family History  Problem Relation Age of Onset  . Arthritis Mother   . Macular degeneration Mother   . Heart disease Father   . Breast cancer Neg Hx     SOCIAL HX: married has daughter and son; son lives with her as well as husband    Current Outpatient Medications:  .  albuterol (VENTOLIN HFA) 108 (90 Base) MCG/ACT inhaler, Inhale 2 puffs into the lungs every 6 (six) hours as needed for wheezing or shortness of breath., Disp: 1 Inhaler, Rfl: 2 .  clonazePAM (KLONOPIN) 0.5 MG tablet, Take 0.5 mg by mouth 2 (two) times daily., Disp: , Rfl:  .  ergocalciferol (VITAMIN D2) 50000 units capsule, Take 1 capsule (50,000 Units total) by mouth once a week., Disp: 4 capsule, Rfl: 2 .  famotidine (PEPCID) 20 MG tablet, TAKE 1 TABLET (20 MG TOTAL) BY MOUTH DAILY. WITH BREAKFAST, Disp: 90 tablet, Rfl: 3 .  gabapentin (NEURONTIN) 300 MG capsule, Take 1 capsule (300 mg total) by mouth 3 (three) times daily., Disp: 90 capsule,  Rfl: 3 .  levothyroxine (SYNTHROID) 88 MCG tablet, TAKE 1 TABLET BY MOUTH  DAILY, Disp: 30 tablet, Rfl: 0 .  lurasidone (LATUDA) 20 MG TABS tablet, Take by mouth daily., Disp: , Rfl:  .  metoprolol succinate (TOPROL-XL) 25 MG 24 hr tablet, TAKE 1 TABLET BY MOUTH  DAILY, Disp: 90 tablet, Rfl: 1 .  oxybutynin (DITROPAN) 5 MG tablet, TAKE 1/2 OF A TABLET BY MOUTH TWICE A DAY, Disp: 90 tablet, Rfl: 1 .  ropinirole (REQUIP) 5 MG tablet, Take 5 mg by mouth at bedtime., Disp: , Rfl: 0 .  tizanidine (ZANAFLEX) 6 MG capsule, , Disp: , Rfl:  .  tizanidine (ZANAFLEX) 6 MG capsule, TAKE 1 CAPSULE BY MOUTH THREE TIMES A DAY AS NEEDED FOR MUSCLE SPASMS, Disp: 60 capsule, Rfl: 2 .  Vilazodone HCl  (VIIBRYD) 40 MG TABS, Take by mouth daily., Disp: , Rfl:  .  Olopatadine HCl 0.2 % SOLN, Apply 1 drop to eye daily., Disp: 1 Bottle, Rfl: 0  EXAM:  VITALS per patient if applicable:  GENERAL: alert, oriented, appears well and in no acute distress  HEENT: atraumatic, conjunttiva clear, no obvious abnormalities on inspection of external nose and ears  NECK: normal movements of the head and neck  LUNGS: on inspection no signs of respiratory distress, breathing rate appears normal, no obvious gross SOB, gasping or wheezing  CV: no obvious cyanosis  MS: moves all visible extremities without noticeable abnormality  PSYCH/NEURO: pleasant and cooperative, no obvious depression or anxiety, speech and thought processing grossly intact  ASSESSMENT AND PLAN:  Discussed the following assessment and plan:  Advice Given About Covid-19 Virus by Telephone risk factors potential contacts (son and daughter see HPI), age, heart disease (h/o MVP follows with cardiology) -sch COVID 19 testing [place] 05/18/2018 11 am  -if feeling worse rec [facility] -rec prn Tylenol for headache  -encouraged oral intake/hydration, vitamins (I.e C and D) pt also reports taking N acetyl cystine and zinc to boost immunity  Itchy eyes Allergic rhinitis, unspecified seasonality, unspecified trigger -trial of pataday  -rec take claritin, allegra zyrtec was not effective in the past     I discussed the assessment and treatment plan with the patient. The patient was provided an opportunity to ask questions and all were answered. The patient agreed with the plan and demonstrated an understanding of the instructions.   The patient was advised to call back or seek an in-person evaluation if the symptoms worsen or if the condition fails to improve as anticipated.  Time spent 15 minutes  Bevelyn Bucklesracy N McLean-Scocuzza, MD

## 2018-05-18 ENCOUNTER — Other Ambulatory Visit: Payer: Self-pay

## 2018-05-18 DIAGNOSIS — R6889 Other general symptoms and signs: Secondary | ICD-10-CM | POA: Diagnosis not present

## 2018-05-21 LAB — NOVEL CORONAVIRUS, NAA: SARS-CoV-2, NAA: NOT DETECTED

## 2018-05-22 ENCOUNTER — Telehealth: Payer: Self-pay | Admitting: Internal Medicine

## 2018-05-22 NOTE — Telephone Encounter (Signed)
COVID 19 negative  If she is still not better please call to schedule appt with PCP Dr. Darrick Huntsman   TMS

## 2018-05-23 ENCOUNTER — Ambulatory Visit (INDEPENDENT_AMBULATORY_CARE_PROVIDER_SITE_OTHER): Payer: Medicare Other | Admitting: Psychology

## 2018-05-23 DIAGNOSIS — F4311 Post-traumatic stress disorder, acute: Secondary | ICD-10-CM | POA: Diagnosis not present

## 2018-05-23 DIAGNOSIS — F4481 Dissociative identity disorder: Secondary | ICD-10-CM | POA: Diagnosis not present

## 2018-05-23 NOTE — Telephone Encounter (Signed)
Called pt and informed her of negative results, I also stated if she is not feeling better call her PCP, pt states she has an appt with provider on Friday.  Mykal Kirchman,cma

## 2018-05-25 ENCOUNTER — Ambulatory Visit (INDEPENDENT_AMBULATORY_CARE_PROVIDER_SITE_OTHER): Payer: Medicare Other | Admitting: Internal Medicine

## 2018-05-25 ENCOUNTER — Other Ambulatory Visit: Payer: Self-pay

## 2018-05-25 ENCOUNTER — Encounter: Payer: Self-pay | Admitting: Internal Medicine

## 2018-05-25 DIAGNOSIS — E538 Deficiency of other specified B group vitamins: Secondary | ICD-10-CM

## 2018-05-25 DIAGNOSIS — R0681 Apnea, not elsewhere classified: Secondary | ICD-10-CM

## 2018-05-25 DIAGNOSIS — R6889 Other general symptoms and signs: Secondary | ICD-10-CM | POA: Diagnosis not present

## 2018-05-25 DIAGNOSIS — E034 Atrophy of thyroid (acquired): Secondary | ICD-10-CM

## 2018-05-25 DIAGNOSIS — R5383 Other fatigue: Secondary | ICD-10-CM

## 2018-05-25 DIAGNOSIS — K760 Fatty (change of) liver, not elsewhere classified: Secondary | ICD-10-CM | POA: Diagnosis not present

## 2018-05-25 DIAGNOSIS — Z20822 Contact with and (suspected) exposure to covid-19: Secondary | ICD-10-CM

## 2018-05-25 DIAGNOSIS — R0683 Snoring: Secondary | ICD-10-CM

## 2018-05-25 DIAGNOSIS — E559 Vitamin D deficiency, unspecified: Secondary | ICD-10-CM

## 2018-05-25 NOTE — Patient Instructions (Signed)
I have ordered the COVID 19 antibody test , which has to be done at [facility],  I will order a sleep study as well

## 2018-05-25 NOTE — Progress Notes (Signed)
Virtual Visit via [email or web address]  This visit type was conducted due to national recommendations for restrictions regarding the COVID-19 pandemic (e.g. social distancing).  This format is felt to be most appropriate for this patient at this time.  All issues noted in this document were discussed and addressed.  No physical exam was performed (except for noted visual exam findings with Video Visits).   I connected with@ on 05/25/18 at  9:30 AM EDT by a video enabled telemedicine application or telephone and verified that I am speaking with the correct person using two identifiers. Location patient: home Location provider: work or home office Persons participating in the virtual visit: patient, provider  I discussed the limitations, risks, security and privacy concerns of performing an evaluation and management service by telephone and the availability of in person appointments. I also discussed with the patient that there may be a patient responsible charge related to this service. The patient expressed understanding and agreed to proceed.   Reason for visit: profound fatigue HPI:  MEDICARE March 04 2018  suspected COVID 19 Infection in February.  Had a severe respiratory illness,  So did husband,  Then her psychiatrist became ill after seeing her.  All started after a house guest from [place] visited her .  Has not felt well for 3 months. COVID 19 antigen test was negative in early Match but serologic testing has not been done .Cough has recovered,  But still hoarse,  And not sleeping well for the past few days. Too tired to walk or even read.    Needs sleep study  .  husband notices apneic spells and lots of snoring ,  Daytime fatigue , but no hypersomnolence .  Does not drive   OAB: was having PT and using myrbetriq .  PT through [facility] in [place] but was suspended due to loss of insurance and COVID 19    ROS: See pertinent positives and negatives per HPI.  Past Medical History:  Diagnosis  Date  . Anxiety   . Arthritis   . Atopic dermatitis    scalp  . Depression   . Hyperlipidemia   . Hypertension    pt denies  . Thyroid disease   . Tobacco abuse     Past Surgical History:  Procedure Laterality Date  . LUMBAR DISC SURGERY    . ROTATOR CUFF REPAIR Bilateral     Family History  Problem Relation Age of Onset  . Arthritis Mother   . Macular degeneration Mother   . Heart disease Father   . Breast cancer Neg Hx     SOCIAL HX:  Social History   Tobacco Use  . Smoking status: Former Smoker    Packs/day: 0.25    Types: Cigarettes    Last attempt to quit: 01/02/2016    Years since quitting: 2.4  . Smokeless tobacco: Never Used  Substance Use Topics  . Alcohol use: Yes    Comment: once a week   . Drug use: No   Social History   Socioeconomic History  . Marital status: Married    Spouse name: Not on file  . Number of children: Not on file  . Years of education: Not on file  . Highest education level: Not on file  Occupational History  . Not on file  Social Needs  . Financial resource strain: Not on file  . Food insecurity:    Worry: Not on file    Inability: Not on file  . Transportation needs:  Medical: Not on file    Non-medical: Not on file  Tobacco Use  . Smoking status: Former Smoker    Packs/day: 0.25    Types: Cigarettes    Last attempt to quit: 01/02/2016    Years since quitting: 2.4  . Smokeless tobacco: Never Used  Substance and Sexual Activity  . Alcohol use: Yes    Comment: once a week   . Drug use: No  . Sexual activity: Not on file  Lifestyle  . Physical activity:    Days per week: Not on file    Minutes per session: Not on file  . Stress: Not on file  Relationships  . Social connections:    Talks on phone: Not on file    Gets together: Not on file    Attends religious service: Not on file    Active member of club or organization: Not on file    Attends meetings of clubs or organizations: Not on file     Relationship status: Not on file  . Intimate partner violence:    Fear of current or ex partner: Not on file    Emotionally abused: Not on file    Physically abused: Not on file    Forced sexual activity: Not on file  Other Topics Concern  . Not on file  Social History Narrative  . Not on file     Current Outpatient Medications:  .  albuterol (VENTOLIN HFA) 108 (90 Base) MCG/ACT inhaler, Inhale 2 puffs into the lungs every 6 (six) hours as needed for wheezing or shortness of breath., Disp: 1 Inhaler, Rfl: 2 .  clonazePAM (KLONOPIN) 0.5 MG tablet, Take 0.5 mg by mouth 2 (two) times daily., Disp: , Rfl:  .  gabapentin (NEURONTIN) 300 MG capsule, Take 1 capsule (300 mg total) by mouth 3 (three) times daily., Disp: 90 capsule, Rfl: 3 .  levothyroxine (SYNTHROID) 88 MCG tablet, TAKE 1 TABLET BY MOUTH  DAILY, Disp: 30 tablet, Rfl: 0 .  metoprolol succinate (TOPROL-XL) 25 MG 24 hr tablet, TAKE 1 TABLET BY MOUTH  DAILY, Disp: 90 tablet, Rfl: 1 .  mirabegron ER (MYRBETRIQ) 50 MG TB24 tablet, Take 50 mg by mouth daily., Disp: , Rfl:  .  Olopatadine HCl 0.2 % SOLN, Apply 1 drop to eye daily., Disp: 1 Bottle, Rfl: 0 .  tizanidine (ZANAFLEX) 6 MG capsule, TAKE 1 CAPSULE BY MOUTH THREE TIMES A DAY AS NEEDED FOR MUSCLE SPASMS, Disp: 60 capsule, Rfl: 2 .  Vilazodone HCl (VIIBRYD) 40 MG TABS, Take by mouth daily. , Disp: , Rfl:   EXAM:  VITALS per patient if applicable:  GENERAL: alert, oriented, appears well and in no acute distress  HEENT: atraumatic, conjunttiva clear, no obvious abnormalities on inspection of external nose and ears  NECK: normal movements of the head and neck  LUNGS: on inspection no signs of respiratory distress, breathing rate appears normal, no obvious gross SOB, gasping or wheezing  CV: no obvious cyanosis  MS: moves all visible extremities without noticeable abnormality  PSYCH/NEURO: pleasant and cooperative, no obvious depression or anxiety, speech and thought  processing grossly intact  ASSESSMENT AND PLAN:  Discussed the following assessment and plan:  Fatty liver disease, nonalcoholic - Plan: Comprehensive metabolic panel  Hypothyroidism due to acquired atrophy of thyroid - Plan: TSH, T4, free  Fatigue, unspecified type - Plan: CBC with Differential/Platelet, Ambulatory referral to Sleep Studies  Suspected Covid-19 Virus Infection - Plan: SAR CoV2 Serology (COVID 19)AB(IGG)IA  B12 deficiency -  Plan: Vitamin B12  Vitamin D deficiency - Plan: VITAMIN D 25 Hydroxy (Vit-D Deficiency, Fractures)  Witnessed apneic spells - Plan: Ambulatory referral to Sleep Studies  Snoring - Plan: Ambulatory referral to Sleep Studies  Fatigue Etiology unclear.  Will screen for thyroid, anemia,  hepatic and renal insufficiency, encourage regular exercise 5 days /week,  order sleep study  For snoring and witnessed apneic events  Suspected Covid-19 Virus Infection covid 19 antigen was checked and negative in May  but her initial illness was in February   . Ab test ordered     I discussed the assessment and treatment plan with the patient. The patient was provided an opportunity to ask questions and all were answered. The patient agreed with the plan and demonstrated an understanding of the instructions.   The patient was advised to call back or seek an in-person evaluation if the symptoms worsen or if the condition fails to improve as anticipated.  I provided 25 minutes of non-face-to-face time during this encounter.   Sherlene Shams, MD

## 2018-05-25 NOTE — Progress Notes (Signed)
Pt stated that she takes levothyroxine but would like to discuss possible thyroid issues. Pt stated that she has been very fatigued, weak and loss of hair.   Pt also stated that she would like to have the Covid-19 antibody testing done. She stated that her and her husband were very sick back in February with respiratory issues and before that they had a friend staying with them from [place].

## 2018-05-28 DIAGNOSIS — Z20822 Contact with and (suspected) exposure to covid-19: Secondary | ICD-10-CM | POA: Insufficient documentation

## 2018-05-28 NOTE — Assessment & Plan Note (Signed)
covid 19 antigen was checked and negative in May  but her initial illness was in February   . Ab test ordered

## 2018-05-28 NOTE — Assessment & Plan Note (Signed)
Etiology unclear.  Will screen for thyroid, anemia,  hepatic and renal insufficiency, encourage regular exercise 5 days /week,  order sleep study  For snoring and witnessed apneic events

## 2018-05-29 ENCOUNTER — Telehealth: Payer: Self-pay | Admitting: *Deleted

## 2018-05-29 DIAGNOSIS — Z20822 Contact with and (suspected) exposure to covid-19: Secondary | ICD-10-CM

## 2018-05-29 DIAGNOSIS — E034 Atrophy of thyroid (acquired): Secondary | ICD-10-CM

## 2018-05-29 DIAGNOSIS — R5383 Other fatigue: Secondary | ICD-10-CM | POA: Diagnosis not present

## 2018-05-29 DIAGNOSIS — E1159 Type 2 diabetes mellitus with other circulatory complications: Secondary | ICD-10-CM

## 2018-05-29 DIAGNOSIS — E559 Vitamin D deficiency, unspecified: Secondary | ICD-10-CM | POA: Diagnosis not present

## 2018-05-29 DIAGNOSIS — E538 Deficiency of other specified B group vitamins: Secondary | ICD-10-CM

## 2018-05-29 DIAGNOSIS — E7800 Pure hypercholesterolemia, unspecified: Secondary | ICD-10-CM

## 2018-05-29 DIAGNOSIS — K760 Fatty (change of) liver, not elsewhere classified: Secondary | ICD-10-CM

## 2018-05-29 DIAGNOSIS — E669 Obesity, unspecified: Secondary | ICD-10-CM

## 2018-05-29 NOTE — Telephone Encounter (Signed)
All labs have been reordered so that [facility] can see them. Pt is aware.

## 2018-05-29 NOTE — Telephone Encounter (Signed)
I have called [facility] to add on the COVID antibody tests.

## 2018-05-29 NOTE — Telephone Encounter (Signed)
Copied from CRM [id number]. Topic: Quick Communication - See Telephone Encounter >> May 29, 2018  9:15 AM Aretta Nip wrote: CRM for notification. See Telephone encounter for: 05/29/18. Pt saw Dr Darrick Huntsman on 5/22 in which she ordered several labs. Per the discussion she was going to order the COVID antibody test and when she got to [facility] this morning they told her the order was not in her chart. She said they drew 3 tubes of blood and if the order got placed quickly they would try to use the existing draw in order to save her a trip from coming back. Pt number is [phone]

## 2018-05-30 ENCOUNTER — Other Ambulatory Visit: Payer: Self-pay | Admitting: Internal Medicine

## 2018-05-30 LAB — CBC WITH DIFFERENTIAL/PLATELET
Basophils Absolute: 0.1 10*3/uL (ref 0.0–0.2)
Basos: 1 %
EOS (ABSOLUTE): 0.2 10*3/uL (ref 0.0–0.4)
Eos: 4 %
Hematocrit: 42 % (ref 34.0–46.6)
Hemoglobin: 14.2 g/dL (ref 11.1–15.9)
Immature Grans (Abs): 0 10*3/uL (ref 0.0–0.1)
Immature Granulocytes: 0 %
Lymphocytes Absolute: 2.3 10*3/uL (ref 0.7–3.1)
Lymphs: 35 %
MCH: 30.7 pg (ref 26.6–33.0)
MCHC: 33.8 g/dL (ref 31.5–35.7)
MCV: 91 fL (ref 79–97)
Monocytes Absolute: 0.4 10*3/uL (ref 0.1–0.9)
Monocytes: 6 %
Neutrophils Absolute: 3.6 10*3/uL (ref 1.4–7.0)
Neutrophils: 54 %
Platelets: 284 10*3/uL (ref 150–450)
RBC: 4.62 x10E6/uL (ref 3.77–5.28)
RDW: 13.2 % (ref 11.7–15.4)
WBC: 6.6 10*3/uL (ref 3.4–10.8)

## 2018-05-30 LAB — SPECIMEN STATUS REPORT

## 2018-05-30 LAB — VITAMIN D 25 HYDROXY (VIT D DEFICIENCY, FRACTURES): Vit D, 25-Hydroxy: 23.7 ng/mL — ABNORMAL LOW (ref 30.0–100.0)

## 2018-05-30 LAB — T4, FREE: Free T4: 1.07 ng/dL (ref 0.82–1.77)

## 2018-05-30 MED ORDER — ERGOCALCIFEROL 1.25 MG (50000 UT) PO CAPS: 50000.0000 [IU] | ORAL_CAPSULE | ORAL | 0 refills | Status: DC

## 2018-06-05 LAB — SAR COV2 SEROLOGY (COVID19)AB(IGG),IA: SARS-CoV-2 Ab, IgG: NEGATIVE

## 2018-06-05 LAB — SPECIMEN STATUS REPORT

## 2018-06-06 ENCOUNTER — Ambulatory Visit (INDEPENDENT_AMBULATORY_CARE_PROVIDER_SITE_OTHER): Payer: Medicare Other | Admitting: Psychology

## 2018-06-06 DIAGNOSIS — F4311 Post-traumatic stress disorder, acute: Secondary | ICD-10-CM | POA: Diagnosis not present

## 2018-06-06 DIAGNOSIS — F4481 Dissociative identity disorder: Secondary | ICD-10-CM | POA: Diagnosis not present

## 2018-06-15 ENCOUNTER — Other Ambulatory Visit: Payer: Self-pay | Admitting: Internal Medicine

## 2018-06-19 DIAGNOSIS — H1031 Unspecified acute conjunctivitis, right eye: Secondary | ICD-10-CM | POA: Diagnosis not present

## 2018-06-20 ENCOUNTER — Ambulatory Visit (INDEPENDENT_AMBULATORY_CARE_PROVIDER_SITE_OTHER): Payer: Medicare Other | Admitting: Psychology

## 2018-06-20 DIAGNOSIS — F4481 Dissociative identity disorder: Secondary | ICD-10-CM | POA: Diagnosis not present

## 2018-06-20 DIAGNOSIS — F4311 Post-traumatic stress disorder, acute: Secondary | ICD-10-CM

## 2018-06-21 DIAGNOSIS — R0602 Shortness of breath: Secondary | ICD-10-CM | POA: Diagnosis not present

## 2018-07-04 ENCOUNTER — Ambulatory Visit (INDEPENDENT_AMBULATORY_CARE_PROVIDER_SITE_OTHER): Payer: Medicare Other | Admitting: Psychology

## 2018-07-04 DIAGNOSIS — F4481 Dissociative identity disorder: Secondary | ICD-10-CM | POA: Diagnosis not present

## 2018-07-04 DIAGNOSIS — F4311 Post-traumatic stress disorder, acute: Secondary | ICD-10-CM | POA: Diagnosis not present

## 2018-07-18 ENCOUNTER — Ambulatory Visit (INDEPENDENT_AMBULATORY_CARE_PROVIDER_SITE_OTHER): Payer: Medicare Other | Admitting: Psychology

## 2018-07-18 DIAGNOSIS — F4481 Dissociative identity disorder: Secondary | ICD-10-CM | POA: Diagnosis not present

## 2018-07-18 DIAGNOSIS — H1089 Other conjunctivitis: Secondary | ICD-10-CM | POA: Diagnosis not present

## 2018-07-18 DIAGNOSIS — F4311 Post-traumatic stress disorder, acute: Secondary | ICD-10-CM | POA: Diagnosis not present

## 2018-08-01 ENCOUNTER — Ambulatory Visit: Payer: No Typology Code available for payment source | Admitting: Psychology

## 2018-08-01 DIAGNOSIS — H2513 Age-related nuclear cataract, bilateral: Secondary | ICD-10-CM | POA: Diagnosis not present

## 2018-08-06 ENCOUNTER — Other Ambulatory Visit: Payer: Self-pay | Admitting: Internal Medicine

## 2018-08-15 ENCOUNTER — Ambulatory Visit (INDEPENDENT_AMBULATORY_CARE_PROVIDER_SITE_OTHER): Payer: Medicare Other | Admitting: Psychology

## 2018-08-15 DIAGNOSIS — F4481 Dissociative identity disorder: Secondary | ICD-10-CM | POA: Diagnosis not present

## 2018-08-15 DIAGNOSIS — F4311 Post-traumatic stress disorder, acute: Secondary | ICD-10-CM

## 2018-08-16 ENCOUNTER — Other Ambulatory Visit: Payer: Self-pay | Admitting: Internal Medicine

## 2018-08-29 ENCOUNTER — Ambulatory Visit (INDEPENDENT_AMBULATORY_CARE_PROVIDER_SITE_OTHER): Payer: Medicare Other | Admitting: Psychology

## 2018-08-29 DIAGNOSIS — F4311 Post-traumatic stress disorder, acute: Secondary | ICD-10-CM | POA: Diagnosis not present

## 2018-08-29 DIAGNOSIS — F4481 Dissociative identity disorder: Secondary | ICD-10-CM

## 2018-08-30 ENCOUNTER — Other Ambulatory Visit: Payer: Self-pay | Admitting: Internal Medicine

## 2018-08-30 ENCOUNTER — Encounter: Payer: Self-pay | Admitting: Internal Medicine

## 2018-09-12 ENCOUNTER — Ambulatory Visit (INDEPENDENT_AMBULATORY_CARE_PROVIDER_SITE_OTHER): Payer: Medicare Other | Admitting: Psychology

## 2018-09-12 DIAGNOSIS — F4311 Post-traumatic stress disorder, acute: Secondary | ICD-10-CM | POA: Diagnosis not present

## 2018-09-12 DIAGNOSIS — F4481 Dissociative identity disorder: Secondary | ICD-10-CM | POA: Diagnosis not present

## 2018-09-13 ENCOUNTER — Other Ambulatory Visit: Payer: Self-pay

## 2018-09-18 ENCOUNTER — Encounter: Payer: Self-pay | Admitting: Internal Medicine

## 2018-09-18 ENCOUNTER — Ambulatory Visit (INDEPENDENT_AMBULATORY_CARE_PROVIDER_SITE_OTHER): Payer: Medicare Other | Admitting: Internal Medicine

## 2018-09-18 ENCOUNTER — Other Ambulatory Visit: Payer: Self-pay

## 2018-09-18 VITALS — Ht 65.0 in | Wt 260.0 lb

## 2018-09-18 DIAGNOSIS — G4733 Obstructive sleep apnea (adult) (pediatric): Secondary | ICD-10-CM | POA: Diagnosis not present

## 2018-09-18 DIAGNOSIS — E559 Vitamin D deficiency, unspecified: Secondary | ICD-10-CM | POA: Diagnosis not present

## 2018-09-18 DIAGNOSIS — J309 Allergic rhinitis, unspecified: Secondary | ICD-10-CM | POA: Diagnosis not present

## 2018-09-18 DIAGNOSIS — R6889 Other general symptoms and signs: Secondary | ICD-10-CM

## 2018-09-18 DIAGNOSIS — R5383 Other fatigue: Secondary | ICD-10-CM | POA: Diagnosis not present

## 2018-09-18 DIAGNOSIS — H10503 Unspecified blepharoconjunctivitis, bilateral: Secondary | ICD-10-CM

## 2018-09-18 MED ORDER — NEOMYCIN-POLYMYXIN-DEXAMETH 3.5-10000-0.1 OP OINT: 1.0000 "application " | TOPICAL_OINTMENT | Freq: Four times a day (QID) | OPHTHALMIC | 1 refills | Status: DC

## 2018-09-18 MED ORDER — OLOPATADINE HCL 0.2 % OP SOLN: 1.0000 [drp] | Freq: Every day | OPHTHALMIC | 11 refills | Status: DC

## 2018-09-18 NOTE — Progress Notes (Signed)
Virtual Visit via [email or web address]  This visit type was conducted due to national recommendations for restrictions regarding the COVID-19 pandemic (e.g. social distancing).  This format is felt to be most appropriate for this patient at this time.  All issues noted in this document were discussed and addressed.  No physical exam was performed (except for noted visual exam findings with Video Visits).   I connected with@ on 09/18/18 at  2:30 PM EDT by a video enabled telemedicine application or telephone and verified that I am speaking with the correct person using two identifiers. Location patient: home Location provider: work or home office Persons participating in the virtual visit: patient, provider  I discussed the limitations, risks, security and privacy concerns of performing an evaluation and management service by telephone and the availability of in person appointments. I also discussed with the patient that there may be a patient responsible charge related to this service. The patient expressed understanding and agreed to proceed.  Reason for visit: follow up   HPI:  63 yr old female with bipolar disorder, OSA,  Anxiety, presents for follow up after having a prolonged illness highly suspicious for COVID 19 infection in late January .  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  She is tearful today due ot the emotional and physical toll her illness has made on her.   2) conjunctivitis:  Recurrent,  Has had two rounds of treatment ,  Only one included antibiotics.  Eyelids matted in the morning with thick fluid,   very painful   ROS: See pertinent positives and negatives per HPI.  Past Medical History:  Diagnosis Date  . Anxiety   . Arthritis   . Atopic dermatitis    scalp  . Depression   . Hyperlipidemia   . Hypertension    pt denies  . Thyroid  disease   . Tobacco abuse     Past Surgical History:  Procedure Laterality Date  . LUMBAR DISC SURGERY    . ROTATOR CUFF REPAIR Bilateral     Family History  Problem Relation Age of Onset  . Arthritis Mother   . Macular degeneration Mother   . Heart disease Father   . Breast cancer Neg Hx     SOCIAL HX:  reports that she quit smoking about 2 years ago. Her smoking use included cigarettes. She smoked 0.25 packs per day. She has never used smokeless tobacco. She reports current alcohol use. She reports that she does not use drugs.   Current Outpatient Medications:  .  albuterol (VENTOLIN HFA) 108 (90 Base) MCG/ACT inhaler, Inhale 2 puffs into the lungs every 6 (six) hours as needed for wheezing or shortness of breath., Disp: 1 Inhaler, Rfl: 2 .  clonazePAM (KLONOPIN) 0.5 MG tablet, Take 0.25 mg by mouth 2 (two) times daily. , Disp: , Rfl:  .  DULoxetine (CYMBALTA) 60 MG capsule, Take 60 mg by mouth daily. , Disp: , Rfl:  .  gabapentin (NEURONTIN) 300 MG capsule, Take 1 capsule (300 mg total) by mouth 3 (three) times daily., Disp: 90 capsule, Rfl: 3 .  levothyroxine (SYNTHROID) 88 MCG tablet, TAKE 1 TABLET BY MOUTH  DAILY, Disp: 90 tablet, Rfl: 0 .  mirabegron ER (MYRBETRIQ) 50 MG TB24 tablet, Take 50 mg by mouth daily., Disp: , Rfl:  .  Olopatadine HCl 0.2 % SOLN, Apply 1 drop to eye  daily., Disp: 2.5 mL, Rfl: 11 .  QUEtiapine (SEROQUEL) 300 MG tablet, Take 300 mg by mouth at bedtime., Disp: , Rfl:  .  ropinirole (REQUIP) 5 MG tablet, Take 5 mg by mouth as needed. , Disp: , Rfl:  .  tizanidine (ZANAFLEX) 6 MG capsule, TAKE 1 CAPSULE BY MOUTH THREE TIMES A DAY AS NEEDED FOR MUSCLE SPASMS, Disp: 60 capsule, Rfl: 2 .  neomycin-polymyxin b-dexamethasone (MAXITROL) 3.5-10000-0.1 OINT, Place 1 application into both eyes 4 (four) times daily., Disp: 3.5 g, Rfl: 1  EXAM:  VITALS per patient if applicable:  GENERAL: alert, oriented, appears well and in no acute distress  HEENT:  atraumatic, conjunttiva clear, no obvious abnormalities on inspection of external nose and ears  NECK: normal movements of the head and neck  LUNGS: on inspection no signs of respiratory distress, breathing rate appears normal, no obvious gross SOB, gasping or wheezing  CV: no obvious cyanosis  MS: moves all visible extremities without noticeable abnormality  PSYCH/NEURO: pleasant and cooperative, no obvious depression or anxiety, speech and thought processing grossly intact  ASSESSMENT AND PLAN:  Discussed the following assessment and plan:  Vitamin D deficiency - Plan: VITAMIN D 25 Hydroxy (Vit-D Deficiency, Fractures)  Fatigue, unspecified type - Plan: IBC + Ferritin, Cortisol, CANCELED: IBC + Ferritin  Itchy eyes - Plan: Olopatadine HCl 0.2 % SOLN  Allergic rhinitis, unspecified seasonality, unspecified trigger - Plan: Olopatadine HCl 0.2 % SOLN  OSA (obstructive sleep apnea)  Blepharoconjunctivitis of both eyes, unspecified blepharoconjunctivitis type  OSA (obstructive sleep apnea) postive home sleep study Oct 2018  No treatment to date. Repeat study ordered in may   Fatigue Patient attributes her fatigue to COVID 19 BUT has untreated moderate OSA by Oct 2018 sleep study done at home  Repeat study  has been ordered but not done.   Conjunctivitis Will repeat treatment with topical antibiotics.  Does not wear contacts ,  Mascara or eye makeup    I discussed the assessment and treatment plan with the patient. The patient was provided an opportunity to ask questions and all were answered. The patient agreed with the plan and demonstrated an understanding of the instructions.   The patient was advised to call back or seek an in-person evaluation if the symptoms worsen or if the condition fails to improve as anticipated.  I provided  25 minutes of non-face-to-face time during this encounter reviewing prior records ,  counseling patient on current issues and coordination of  care. Sherlene Shams.   Dwan Hemmelgarn L Savvy Peeters, MD

## 2018-09-19 DIAGNOSIS — H109 Unspecified conjunctivitis: Secondary | ICD-10-CM | POA: Insufficient documentation

## 2018-09-19 NOTE — Assessment & Plan Note (Addendum)
Will repeat treatment with topical antibiotics.  Does not wear contacts ,  Mascara or eye makeup

## 2018-09-19 NOTE — Assessment & Plan Note (Signed)
Patient attributes her fatigue to COVID 19 BUT has untreated moderate OSA by Oct 2018 sleep study done at home  Repeat study  has been ordered but not done.

## 2018-09-19 NOTE — Assessment & Plan Note (Addendum)
postive home sleep study Oct 2018  No treatment to date. Repeat study ordered in may

## 2018-09-26 ENCOUNTER — Ambulatory Visit (INDEPENDENT_AMBULATORY_CARE_PROVIDER_SITE_OTHER): Payer: Medicare Other | Admitting: Psychology

## 2018-09-26 DIAGNOSIS — F4311 Post-traumatic stress disorder, acute: Secondary | ICD-10-CM | POA: Diagnosis not present

## 2018-09-26 DIAGNOSIS — F4481 Dissociative identity disorder: Secondary | ICD-10-CM | POA: Diagnosis not present

## 2018-10-04 ENCOUNTER — Other Ambulatory Visit (INDEPENDENT_AMBULATORY_CARE_PROVIDER_SITE_OTHER): Payer: Medicare Other

## 2018-10-04 ENCOUNTER — Other Ambulatory Visit: Payer: Self-pay

## 2018-10-04 DIAGNOSIS — I10 Essential (primary) hypertension: Secondary | ICD-10-CM

## 2018-10-04 DIAGNOSIS — E034 Atrophy of thyroid (acquired): Secondary | ICD-10-CM

## 2018-10-04 DIAGNOSIS — R5383 Other fatigue: Secondary | ICD-10-CM | POA: Diagnosis not present

## 2018-10-04 DIAGNOSIS — E7800 Pure hypercholesterolemia, unspecified: Secondary | ICD-10-CM

## 2018-10-04 DIAGNOSIS — E1169 Type 2 diabetes mellitus with other specified complication: Secondary | ICD-10-CM

## 2018-10-04 DIAGNOSIS — Z20822 Contact with and (suspected) exposure to covid-19: Secondary | ICD-10-CM

## 2018-10-04 DIAGNOSIS — E1159 Type 2 diabetes mellitus with other circulatory complications: Secondary | ICD-10-CM

## 2018-10-04 DIAGNOSIS — E559 Vitamin D deficiency, unspecified: Secondary | ICD-10-CM | POA: Diagnosis not present

## 2018-10-04 DIAGNOSIS — E669 Obesity, unspecified: Secondary | ICD-10-CM

## 2018-10-04 DIAGNOSIS — E538 Deficiency of other specified B group vitamins: Secondary | ICD-10-CM

## 2018-10-04 NOTE — Addendum Note (Signed)
Addended by: Leeanne Rio on: 10/04/2018 03:43 PM   Modules accepted: Orders

## 2018-10-05 LAB — LIPID PANEL
Cholesterol: 232 mg/dL — ABNORMAL HIGH (ref 0–200)
HDL: 57.8 mg/dL (ref >39.00)
LDL Cholesterol: 135 mg/dL — ABNORMAL HIGH (ref 0–99)
NonHDL: 174.03
Total CHOL/HDL Ratio: 4
Triglycerides: 194 mg/dL — ABNORMAL HIGH (ref 0.0–149.0)
VLDL: 38.8 mg/dL (ref 0.0–40.0)

## 2018-10-05 LAB — COMPREHENSIVE METABOLIC PANEL
ALT: 25 U/L (ref 0–35)
AST: 14 U/L (ref 0–37)
Albumin: 4.3 g/dL (ref 3.5–5.2)
Alkaline Phosphatase: 55 U/L (ref 39–117)
BUN: 17 mg/dL (ref 6–23)
CO2: 28 mEq/L (ref 19–32)
Calcium: 9.6 mg/dL (ref 8.4–10.5)
Chloride: 103 mEq/L (ref 96–112)
Creatinine, Ser: 0.83 mg/dL (ref 0.40–1.20)
GFR: 68.87 mL/min (ref >60.00)
Glucose, Bld: 108 mg/dL — ABNORMAL HIGH (ref 70–99)
Potassium: 4.4 mEq/L (ref 3.5–5.1)
Sodium: 140 mEq/L (ref 135–145)
Total Bilirubin: 0.4 mg/dL (ref 0.2–1.2)
Total Protein: 6.8 g/dL (ref 6.0–8.3)

## 2018-10-05 LAB — HEMOGLOBIN A1C: Hgb A1c MFr Bld: 6.1 % (ref 4.6–6.5)

## 2018-10-05 LAB — IBC + FERRITIN
Ferritin: 42.4 ng/mL (ref 10.0–291.0)
Iron: 114 ug/dL (ref 42–145)
Saturation Ratios: 38.1 % (ref 20.0–50.0)
Transferrin: 214 mg/dL (ref 212.0–360.0)

## 2018-10-05 LAB — CORTISOL: Cortisol, Plasma: 5.7 ug/dL

## 2018-10-05 LAB — TSH: TSH: 1.92 u[IU]/mL (ref 0.35–4.50)

## 2018-10-05 LAB — VITAMIN D 25 HYDROXY (VIT D DEFICIENCY, FRACTURES): VITD: 24.5 ng/mL — ABNORMAL LOW (ref 30.00–100.00)

## 2018-10-05 LAB — SARS-COV-2 IGG: SARS-COV-2 IgG: 0.02

## 2018-10-05 LAB — VITAMIN B12: Vitamin B-12: 327 pg/mL (ref 211–911)

## 2018-10-10 ENCOUNTER — Ambulatory Visit (INDEPENDENT_AMBULATORY_CARE_PROVIDER_SITE_OTHER): Payer: Medicare Other | Admitting: Psychology

## 2018-10-10 DIAGNOSIS — F4481 Dissociative identity disorder: Secondary | ICD-10-CM

## 2018-10-10 DIAGNOSIS — F4311 Post-traumatic stress disorder, acute: Secondary | ICD-10-CM | POA: Diagnosis not present

## 2018-10-20 ENCOUNTER — Other Ambulatory Visit: Payer: Self-pay | Admitting: Internal Medicine

## 2018-10-20 MED ORDER — ZOSTER VAC RECOMB ADJUVANTED 50 MCG/0.5ML IM SUSR: 0.5000 mL | Freq: Once | INTRAMUSCULAR | 1 refills | Status: AC

## 2018-10-24 ENCOUNTER — Ambulatory Visit: Payer: No Typology Code available for payment source | Admitting: Psychology

## 2018-10-29 ENCOUNTER — Other Ambulatory Visit: Payer: Self-pay | Admitting: Internal Medicine

## 2018-11-07 ENCOUNTER — Ambulatory Visit (INDEPENDENT_AMBULATORY_CARE_PROVIDER_SITE_OTHER): Payer: Medicare Other | Admitting: Psychology

## 2018-11-07 DIAGNOSIS — F4481 Dissociative identity disorder: Secondary | ICD-10-CM | POA: Diagnosis not present

## 2018-11-07 DIAGNOSIS — F4311 Post-traumatic stress disorder, acute: Secondary | ICD-10-CM | POA: Diagnosis not present

## 2018-11-21 ENCOUNTER — Ambulatory Visit (INDEPENDENT_AMBULATORY_CARE_PROVIDER_SITE_OTHER): Payer: Medicare Other | Admitting: Psychology

## 2018-11-21 DIAGNOSIS — F3172 Bipolar disorder, in full remission, most recent episode hypomanic: Secondary | ICD-10-CM | POA: Diagnosis not present

## 2018-11-21 DIAGNOSIS — F4481 Dissociative identity disorder: Secondary | ICD-10-CM | POA: Diagnosis not present

## 2018-12-05 ENCOUNTER — Ambulatory Visit (INDEPENDENT_AMBULATORY_CARE_PROVIDER_SITE_OTHER): Payer: Medicare Other | Admitting: Psychology

## 2018-12-05 DIAGNOSIS — F4311 Post-traumatic stress disorder, acute: Secondary | ICD-10-CM

## 2018-12-05 DIAGNOSIS — F4481 Dissociative identity disorder: Secondary | ICD-10-CM

## 2018-12-19 ENCOUNTER — Ambulatory Visit (INDEPENDENT_AMBULATORY_CARE_PROVIDER_SITE_OTHER): Payer: Medicare Other | Admitting: Psychology

## 2018-12-19 DIAGNOSIS — F4311 Post-traumatic stress disorder, acute: Secondary | ICD-10-CM | POA: Diagnosis not present

## 2018-12-19 DIAGNOSIS — F4481 Dissociative identity disorder: Secondary | ICD-10-CM | POA: Diagnosis not present

## 2018-12-31 ENCOUNTER — Other Ambulatory Visit: Payer: Self-pay | Admitting: Internal Medicine

## 2019-01-02 ENCOUNTER — Ambulatory Visit: Payer: No Typology Code available for payment source | Admitting: Psychology

## 2019-01-16 ENCOUNTER — Ambulatory Visit (INDEPENDENT_AMBULATORY_CARE_PROVIDER_SITE_OTHER): Payer: Medicare Other | Admitting: Psychology

## 2019-01-16 DIAGNOSIS — F4481 Dissociative identity disorder: Secondary | ICD-10-CM | POA: Diagnosis not present

## 2019-01-16 DIAGNOSIS — F4311 Post-traumatic stress disorder, acute: Secondary | ICD-10-CM

## 2019-01-18 ENCOUNTER — Other Ambulatory Visit: Payer: Self-pay | Admitting: Internal Medicine

## 2019-01-22 DIAGNOSIS — H02889 Meibomian gland dysfunction of unspecified eye, unspecified eyelid: Secondary | ICD-10-CM | POA: Diagnosis not present

## 2019-01-30 ENCOUNTER — Ambulatory Visit: Payer: Medicare Other | Admitting: Psychology

## 2019-02-13 ENCOUNTER — Ambulatory Visit (INDEPENDENT_AMBULATORY_CARE_PROVIDER_SITE_OTHER): Payer: Medicare Other | Admitting: Psychology

## 2019-02-13 DIAGNOSIS — F4311 Post-traumatic stress disorder, acute: Secondary | ICD-10-CM | POA: Diagnosis not present

## 2019-02-13 DIAGNOSIS — F4481 Dissociative identity disorder: Secondary | ICD-10-CM | POA: Diagnosis not present

## 2019-02-14 ENCOUNTER — Ambulatory Visit: Payer: Medicare Other

## 2019-02-15 ENCOUNTER — Ambulatory Visit: Payer: Medicare Other | Attending: Internal Medicine

## 2019-02-15 DIAGNOSIS — Z23 Encounter for immunization: Secondary | ICD-10-CM | POA: Insufficient documentation

## 2019-02-15 NOTE — Progress Notes (Signed)
   Covid-19 Vaccination Clinic  Name:  [name]    MRN: [id number] DOB: 1955/11/13  02/15/2019  [name] was observed post Covid-19 immunization for 15 minutes without incidence. She was provided with Vaccine Information Sheet and instruction to access the V-Safe system.   [name] was instructed to call 911 with any severe reactions post vaccine: [name] Difficulty breathing  . Swelling of your face and throat  . A fast heartbeat  . A bad rash all over your body  . Dizziness and weakness    Immunizations Administered    Name Date Dose VIS Date Route   Pfizer COVID-19 Vaccine 02/15/2019 12:22 PM 0.3 mL 12/14/2018 Intramuscular   Manufacturer: [employer], [employer]   Lot: [id number]   NDC: [address]-9

## 2019-02-27 ENCOUNTER — Ambulatory Visit (INDEPENDENT_AMBULATORY_CARE_PROVIDER_SITE_OTHER): Payer: Medicare Other | Admitting: Psychology

## 2019-02-27 DIAGNOSIS — F449 Dissociative and conversion disorder, unspecified: Secondary | ICD-10-CM

## 2019-02-27 DIAGNOSIS — F3181 Bipolar II disorder: Secondary | ICD-10-CM

## 2019-02-27 IMAGING — DX DG HAND COMPLETE 3+V*R*
3 series · 3 of 3 positions shown · non-contrast
Comparison: None.

CLINICAL DATA: Bilateral hand pain for 2 weeks.

EXAM:
RIGHT HAND - COMPLETE 3+ VIEW

[hand pa]
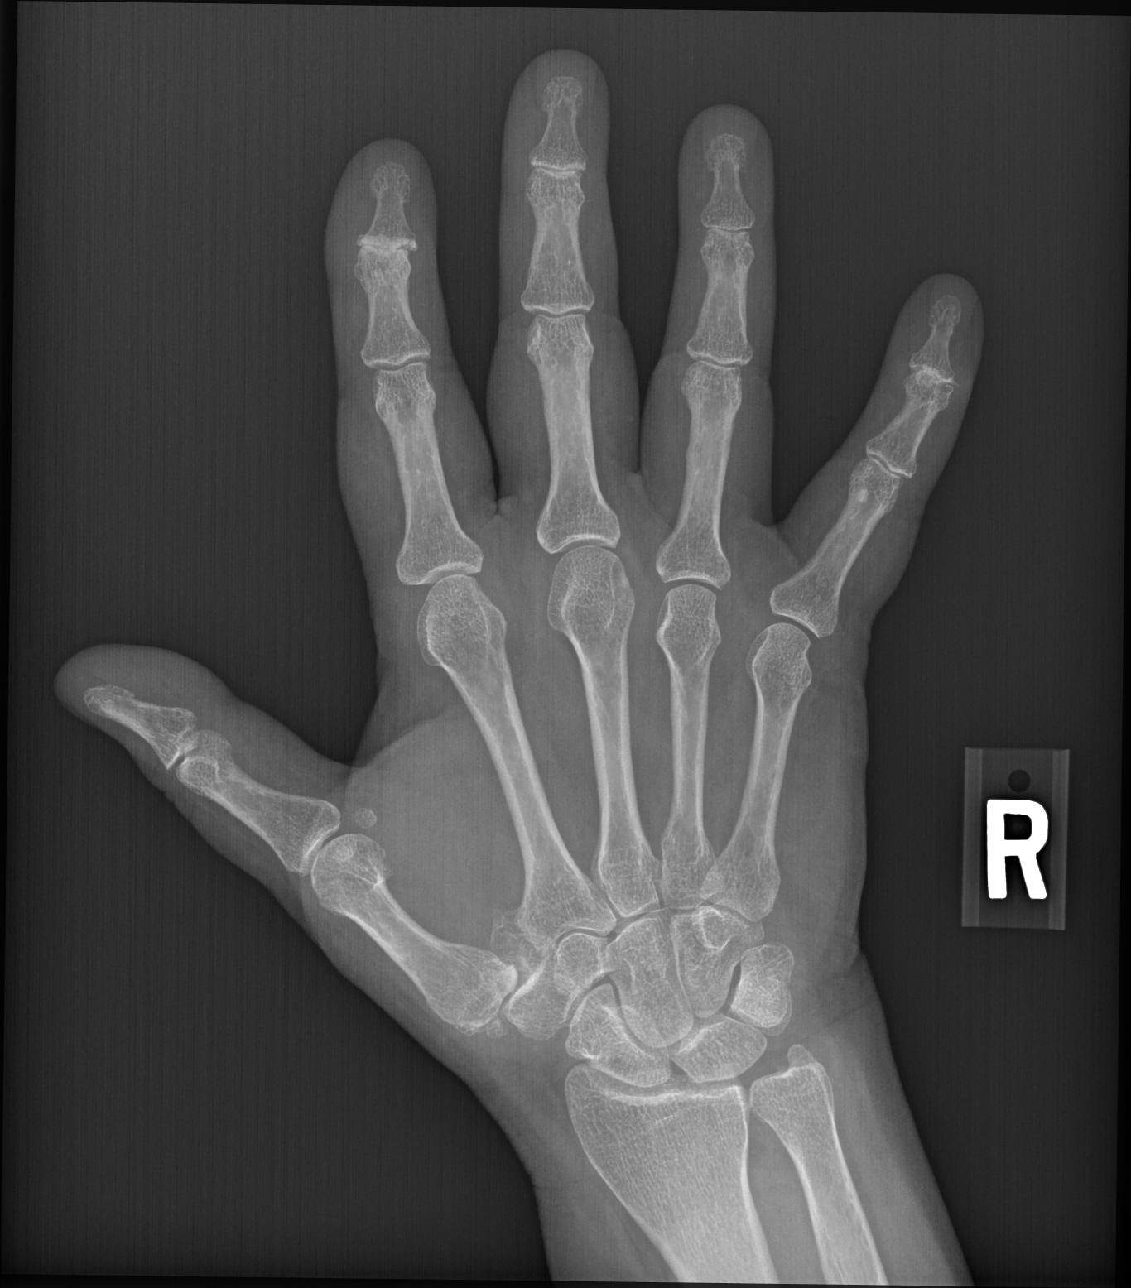

[hand obl (oblique)]
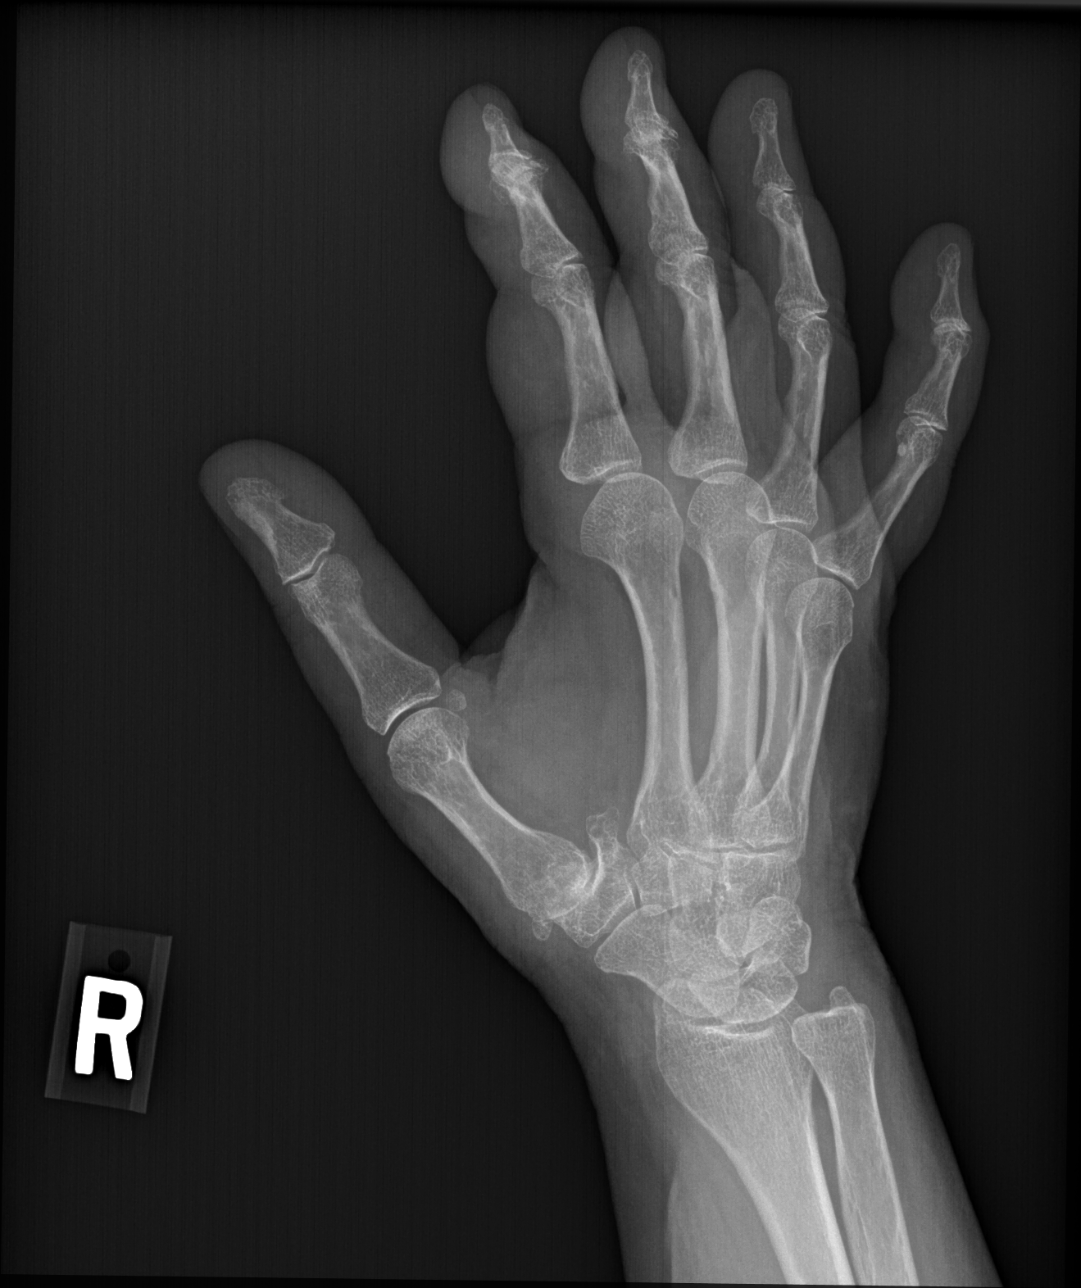

[hand lat]
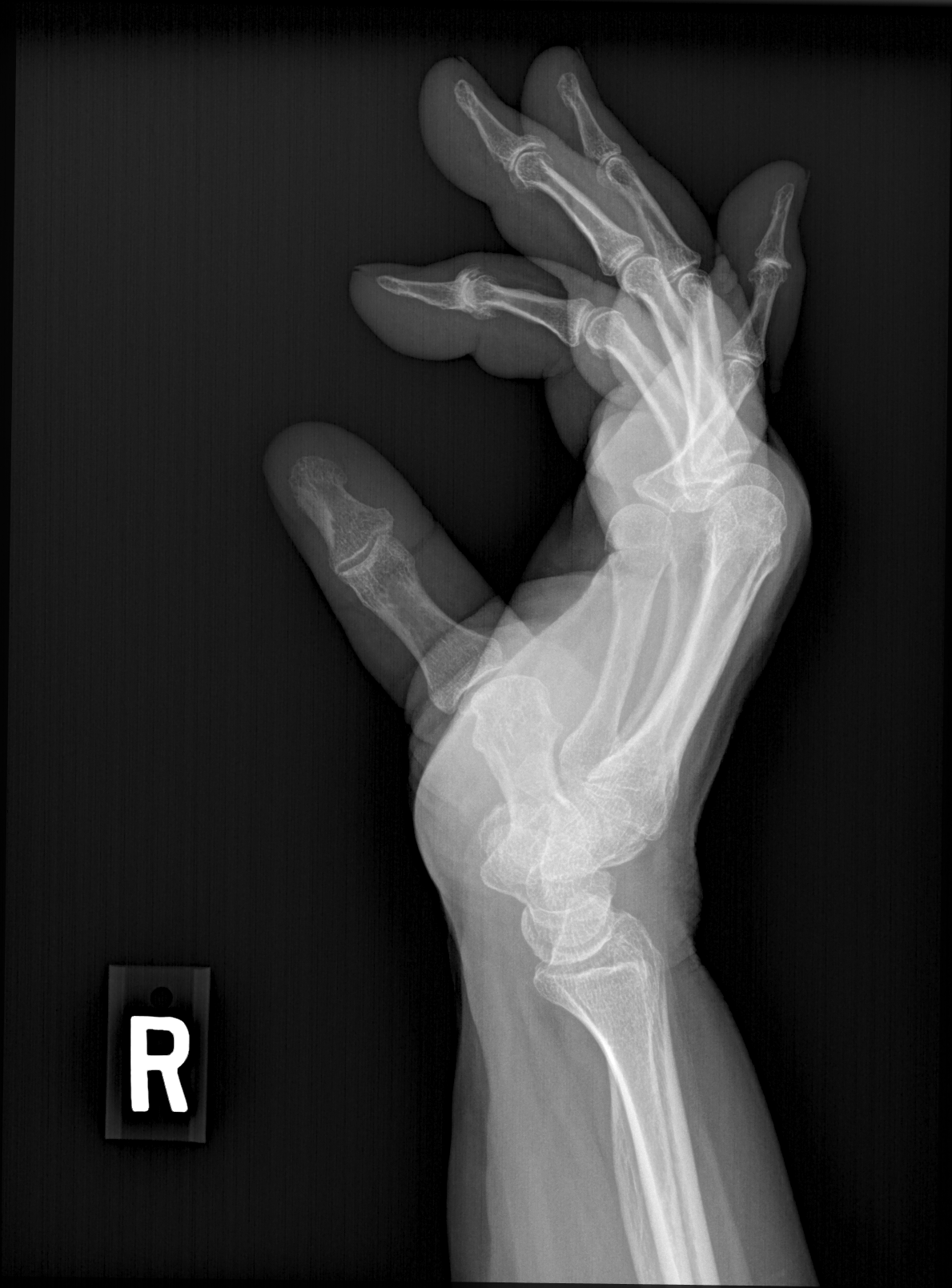

[3 of 3 positions shown; findings below may reference images not displayed]

FINDINGS: No fracture, dislocation or bone lesion.

There is asymmetric joint space narrowing, subchondral sclerosis and
small marginal osteophytes involving DIP joints, most severely the
DIP joint of the index finger and the fifth finger. There is also
joint space narrowing, subchondral sclerosis and small marginal
osteophytes at the trapezium first metacarpal articulation.

Soft tissues are unremarkable.
IMPRESSION: 1. No fracture, dislocation or acute finding.
2. Osteoarthritis most severely involving the DIP joints of the
index and fifth fingers.

## 2019-02-27 IMAGING — DX DG HAND COMPLETE 3+V*L*
3 series · 3 of 3 positions shown · non-contrast
Comparison: 12/12/2008

CLINICAL DATA: Bilateral hand pain and stiffness for 2 weeks.

EXAM:
LEFT HAND - COMPLETE 3+ VIEW

[hand pa]
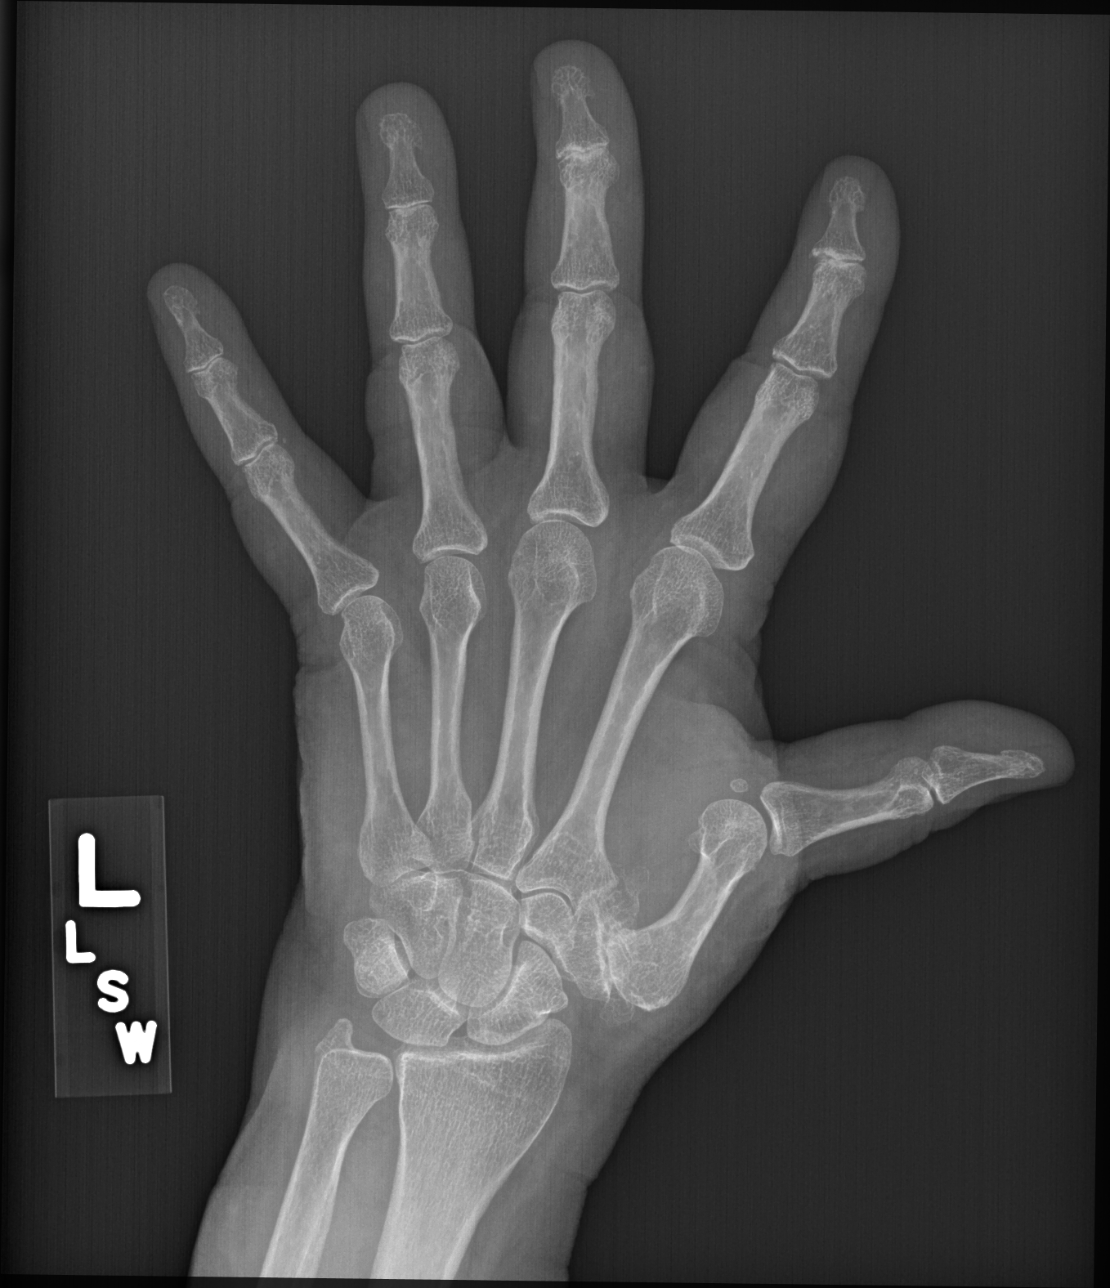

[hand obl (oblique)]
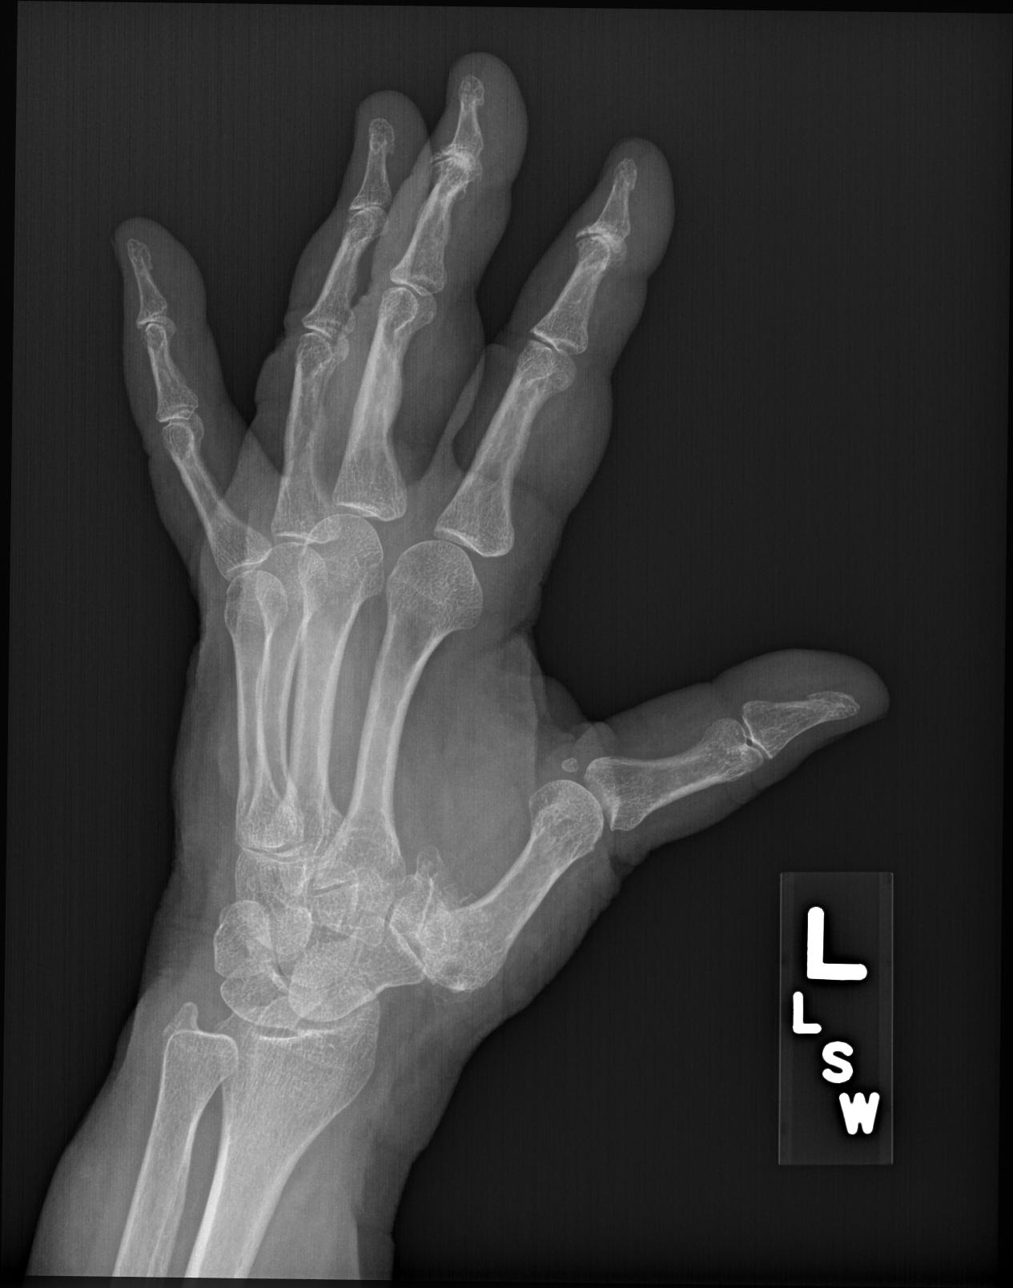

[hand lat]
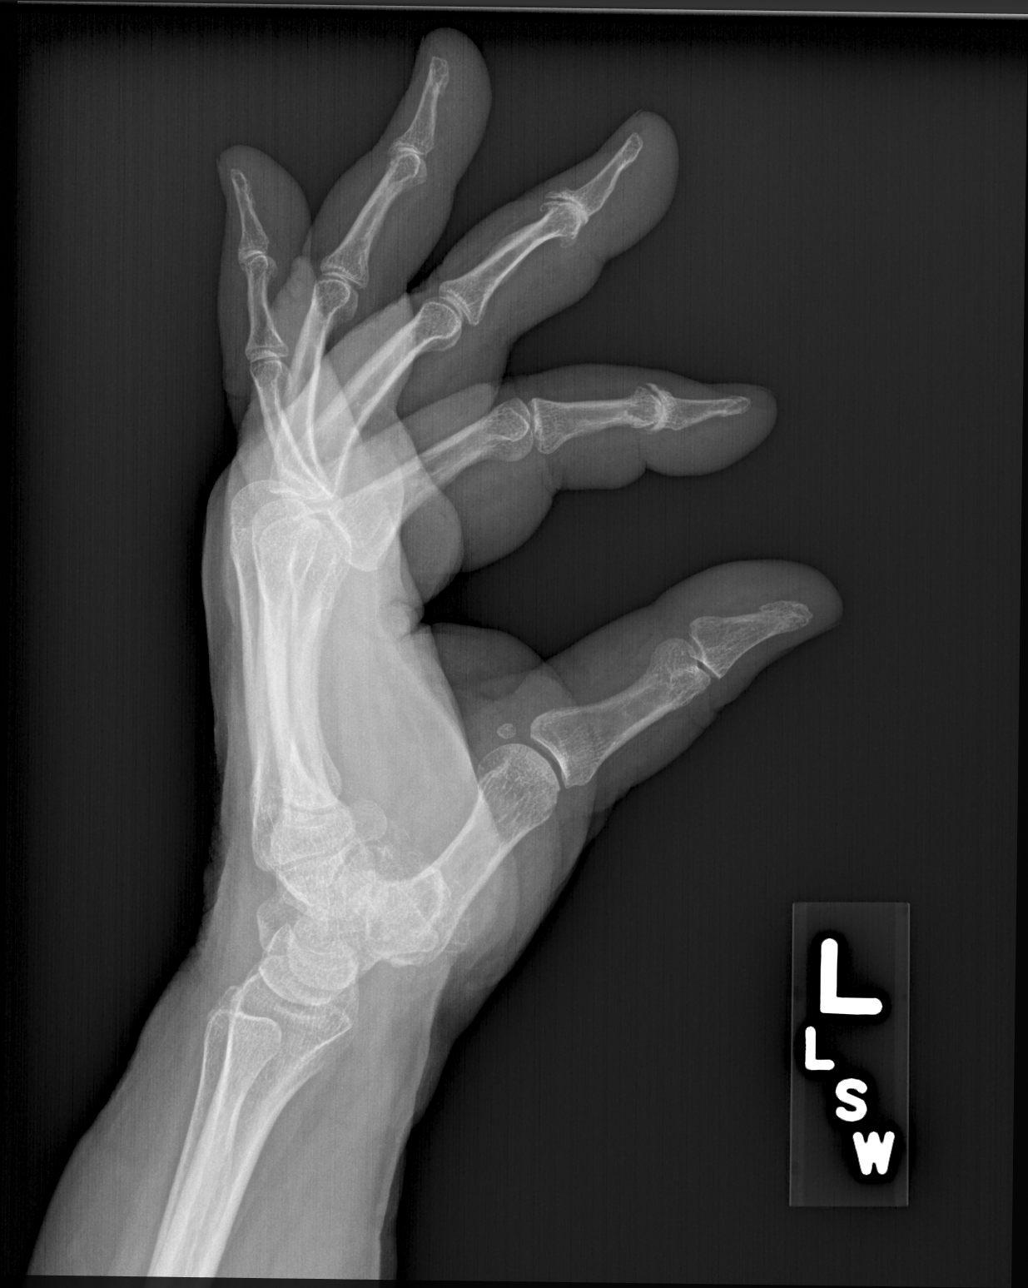

[3 of 3 positions shown; findings below may reference images not displayed]

FINDINGS: No fracture or dislocation.  No bone lesion.

There is asymmetric joint space narrowing with subchondral sclerosis
and marginal osteophytes involving the DIP joints, most prominently
the second and third finger DIP joints, which have mildly progressed
when compared to the prior exam. There is also joint space
narrowing, subchondral sclerosis and marginal osteophytes at the
trapezium first metacarpal articulations similar to the prior
radiographs.

Soft tissues are unremarkable.
IMPRESSION: 1. No fracture, dislocation or acute abnormality.
2. Osteoarthritis most severely affecting the DIP joints of the
second and third fingers and the first carpal metacarpal
articulation. Arthritis has progressed involving the DIP joints.

## 2019-03-07 ENCOUNTER — Telehealth: Payer: Self-pay | Admitting: Internal Medicine

## 2019-03-07 NOTE — Telephone Encounter (Signed)
Patient called and is complaining of a headache, fever, coughing, and fatigue. Patient had her 1st Pfizer covid vaccine on 02/15/2019. Patient wants to know what she should do.

## 2019-03-07 NOTE — Telephone Encounter (Signed)
Left message for patient to return call back.  

## 2019-03-07 NOTE — Telephone Encounter (Signed)
Her reaction is unlikely to be due to the vaccine this far out from her shot (feb 12?)  She should get COVID TESTED

## 2019-03-07 NOTE — Telephone Encounter (Signed)
Gave patient number to [facility] to notify them of her reaction sx. [phone]. Patient understood and is contacting them per the protocol.

## 2019-03-13 ENCOUNTER — Ambulatory Visit: Payer: Medicare Other | Admitting: Psychology

## 2019-03-13 ENCOUNTER — Ambulatory Visit: Payer: Medicare Other

## 2019-03-26 ENCOUNTER — Ambulatory Visit: Payer: Medicare Other

## 2019-03-27 ENCOUNTER — Telehealth: Payer: Self-pay | Admitting: Internal Medicine

## 2019-03-27 ENCOUNTER — Ambulatory Visit: Payer: Medicare Other | Admitting: Psychology

## 2019-03-27 NOTE — Telephone Encounter (Signed)
Spoke with pt and she stated that she reached out to Dr. Darrick Huntsman several weeks ago and was advised to get covid tested. Pt was tested and it came back negative. Pt stated that she is still having a fever, fatigue, headaches, nausea, sore throat, cough and hoarseness. Pt stated that her fever today has been 100.5.

## 2019-03-27 NOTE — Telephone Encounter (Signed)
She needs to schedule an appt

## 2019-03-27 NOTE — Telephone Encounter (Signed)
Called to schedule pt for AWV  Pt states she has had an ongoing fever for over a month. Between 100 and 101 most days. She is fatigued, has headaches, nausea, sore throat, and hoarseness.  Her normal temp is usually around 97.6  Pt is taking ibuprofen   Pt states she had a negative covid test a few weeks ago.  Please advise Pt c/b # [phone]

## 2019-03-28 NOTE — Telephone Encounter (Signed)
She will have to be virtual is that going to be okay?

## 2019-03-28 NOTE — Telephone Encounter (Signed)
Yes virtual is ok

## 2019-03-28 NOTE — Telephone Encounter (Signed)
Pt has been scheduled for a virtual visit with Dr. Darrick Huntsman. Pt is aware of appt date and time.

## 2019-03-29 ENCOUNTER — Other Ambulatory Visit: Payer: Self-pay

## 2019-03-29 ENCOUNTER — Telehealth: Payer: Medicare Other | Admitting: Nurse Practitioner

## 2019-03-29 ENCOUNTER — Ambulatory Visit (INDEPENDENT_AMBULATORY_CARE_PROVIDER_SITE_OTHER): Payer: Medicare Other | Admitting: Internal Medicine

## 2019-03-29 ENCOUNTER — Encounter: Payer: Self-pay | Admitting: Nurse Practitioner

## 2019-03-29 VITALS — BP 128/74 | HR 100 | Temp 98.6°F | Ht 65.5 in | Wt 274.2 lb

## 2019-03-29 VITALS — Temp 101.1°F | Ht 65.0 in | Wt 260.0 lb

## 2019-03-29 DIAGNOSIS — Z20822 Contact with and (suspected) exposure to covid-19: Secondary | ICD-10-CM | POA: Diagnosis not present

## 2019-03-29 DIAGNOSIS — R5383 Other fatigue: Secondary | ICD-10-CM

## 2019-03-29 DIAGNOSIS — R509 Fever, unspecified: Secondary | ICD-10-CM

## 2019-03-29 DIAGNOSIS — R0602 Shortness of breath: Secondary | ICD-10-CM | POA: Diagnosis not present

## 2019-03-29 DIAGNOSIS — R197 Diarrhea, unspecified: Secondary | ICD-10-CM | POA: Diagnosis not present

## 2019-03-29 NOTE — Progress Notes (Signed)
Virtual Visit via Video Note  I connected with Vaughan Basta on 03/29/19 at 10:00 AM EDT by a video enabled telemedicine application as well as we had to switch to  telephone call  for most of the visit d/t technical difficulties. Verified that I am speaking with the correct person using two identifiers. This visit type was conducted due to national recommendations for restrictions regarding the COVID-19 Pandemic (e.g. social distancing).  This format is felt to be most appropriate for this patient at this time.   I discussed the limitations of evaluation and management by telemedicine and the availability of in person appointments. The patient expressed understanding and agreed to proceed.  Only the patient and myself were on today's video visit. The patient was at home and I was in my office at the time of today's visit.   History of Present Illness:  In Feb 2020, she thinks she was exposed to Covid by a friend from [place] who moved in with their family for a year. He was ill for a couple of days but did not get tested. End Feb, 2020, she developed severe fatigue, dry cough, SOB, low O2 sats, placed herself on oxygen that she had at the house, had diarrhea, loss of taste and was very, very  ill. She never sought care or got tested for Covid. She assumed she had it. She continued to have some lingering symptoms over the following months and got tested for Covid in May 2020 that was negative. She tested negative again 10/05/2018. She considers herself a long- hauler and never got over the fatigue, loss of taste, foggy brain, spells of diarrhea, occasional dry cough.  She had the [place] SARS-COV-2 vaccine on 02/15/2019. She called into the office on 03/07/2019 with HA, daily fever 100-101.2 T max, coughing, fatigue. She was advised to get tested as her vaccine was not likely causing fevers this long after the vaccine.  SARS-CoV-2 NAA negative test on 03/07/2019.   Her chief complaint today is: 1.  persistent daily  fevers and 2.  the fact that her Covid symptoms have never gone away- fatigue, diarrhea, HA, occasional dry cough, brain fogginess, loss of smell. She is 80% improved from last year. She never had a documented positive Covid test.  She reports fevers every day 100- 101.2 that did not start until about 10 days after her Covid vaccine. She has not had a spike to 101 for a week. Today, 100.1. Sometimes chills. No dysuria, n congestion, random dry cough but  no sx bronchitis. She gets a  little HA- no sinus congestion at all. No CP. Positive  SOB with walking- exhausted and not doing much. Uses albuterol occasionally. She has noted no wheezing or productive sputum. She was already ordered a stress test by her Cardiologist last year and never felt well enough to take the stress test. She has "something going on with my heart that is not diagnosed, yet. " No current CP or SOB at rest.   Observations/Objective: Gen: Awake, alert, no acute distress. She is in bed at home.  Resp: Breathing is even and non-labored. No congested toe to voice.  Psych: calm/pleasant demeanor Neuro: Alert and Oriented x 3, + facial symmetry, speech is clear.  Assessment and Plan: She gives a history for a Covid infection symptoms last year, but has never had a positive Covid test results. She reports the classic "long haul" symptoms of Covid including loss of taste, foggy brain, debilitating fatigue, SOB, some diarrhea, some HA-no  sinus symptoms, no congestion, sore throat, and very little dry cough. No Sx of pneumonia or bronchitis.   DOE- although the etiology is unclear as she mentions that she did not get her cardiac stress test as advised in 2020.   She has reported daily waxing and waning fevers that she thinks is r/t Covid vaccine. FUO.   She is due for her second Covid vaccine- and needs advice since she has low grade fevers.   Advised the patient to seek care at the [facility] [address] in [place] at 5:15  today. This is for an  in-person examination and laboratory testing as needed. They can advise regarding the management of her long haul Covid symptoms. Further recommendations pending their evaluation.   A total of 20 minutes of telephone time was spent with patient.    Follow Up Instructions:   I discussed the assessment and treatment plan with the patient. The patient was provided an opportunity to ask questions and all were answered. The patient agreed with the plan and demonstrated an understanding of the instructions.   The patient was advised to call back or seek an in-person evaluation if the symptoms worsen or if the condition fails to improve as anticipated.    Amedeo Kinsman, NP

## 2019-03-30 ENCOUNTER — Ambulatory Visit: Payer: Medicare Other

## 2019-03-30 LAB — BASIC METABOLIC PANEL
BUN/Creatinine Ratio: 20 (ref 12–28)
BUN: 16 mg/dL (ref 8–27)
CO2: 24 mmol/L (ref 20–29)
Calcium: 9.3 mg/dL (ref 8.7–10.3)
Chloride: 102 mmol/L (ref 96–106)
Creatinine, Ser: 0.8 mg/dL (ref 0.57–1.00)
GFR calc Af Amer: 89 mL/min/{1.73_m2} (ref >59)
GFR calc non Af Amer: 77 mL/min/{1.73_m2} (ref >59)
Glucose: 148 mg/dL — ABNORMAL HIGH (ref 65–99)
Potassium: 4.4 mmol/L (ref 3.5–5.2)
Sodium: 140 mmol/L (ref 134–144)

## 2019-03-30 LAB — CBC WITH DIFFERENTIAL/PLATELET
Basophils Absolute: 0.1 10*3/uL (ref 0.0–0.2)
Basos: 1 %
EOS (ABSOLUTE): 0.2 10*3/uL (ref 0.0–0.4)
Eos: 3 %
Hematocrit: 40.4 % (ref 34.0–46.6)
Hemoglobin: 13.4 g/dL (ref 11.1–15.9)
Immature Grans (Abs): 0.1 10*3/uL (ref 0.0–0.1)
Immature Granulocytes: 1 %
Lymphocytes Absolute: 2.8 10*3/uL (ref 0.7–3.1)
Lymphs: 32 %
MCH: 30.9 pg (ref 26.6–33.0)
MCHC: 33.2 g/dL (ref 31.5–35.7)
MCV: 93 fL (ref 79–97)
Monocytes Absolute: 0.7 10*3/uL (ref 0.1–0.9)
Monocytes: 8 %
Neutrophils Absolute: 4.9 10*3/uL (ref 1.4–7.0)
Neutrophils: 55 %
Platelets: 309 10*3/uL (ref 150–450)
RBC: 4.34 x10E6/uL (ref 3.77–5.28)
RDW: 12.8 % (ref 11.7–15.4)
WBC: 8.7 10*3/uL (ref 3.4–10.8)

## 2019-03-30 LAB — TSH: TSH: 2.51 u[IU]/mL (ref 0.450–4.500)

## 2019-03-30 LAB — C-REACTIVE PROTEIN: CRP: 4 mg/L (ref 0–10)

## 2019-03-30 LAB — SEDIMENTATION RATE: Sed Rate: 41 mm/hr — ABNORMAL HIGH (ref 0–40)

## 2019-03-31 ENCOUNTER — Ambulatory Visit: Payer: Medicare Other | Attending: Internal Medicine

## 2019-03-31 DIAGNOSIS — Z23 Encounter for immunization: Secondary | ICD-10-CM

## 2019-03-31 NOTE — Progress Notes (Signed)
   Covid-19 Vaccination Clinic  Name:  [name]    MRN: [id number] DOB: 1955-03-05  03/31/2019  [name] was observed post Covid-19 immunization for 15 minutes without incident. She was provided with Vaccine Information Sheet and instruction to access the V-Safe system.   [name] was instructed to call 911 with any severe reactions post vaccine: [name] Difficulty breathing  . Swelling of face and throat  . A fast heartbeat  . A bad rash all over body  . Dizziness and weakness   Immunizations Administered    Name Date Dose VIS Date Route   Pfizer COVID-19 Vaccine 03/31/2019  1:08 PM 0.3 mL 12/14/2018 Intramuscular   Manufacturer: [employer], [employer]   Lot: [id number]   NDC: [address]-7

## 2019-04-01 NOTE — Progress Notes (Signed)
Respiratory Clinic Note   Patient: [name] Female    DOB: Nov 03, 1955   64 y.o.   MRN: [id number] Visit Date: 03/29/2019  Today's Provider: Douglass Rivers MD    Subjective: Fever and chills    Covid-19 Nucleic Acid Test Results Lab Results  Component Value Date   SARSCOV2NAA Not Detected 05/18/2018    HPI the patient describes intermittent fever and chills since March 04, 2019.  She goes on to add that she has actually been sick since mid February 2020, over a year.  She had a virtual visit today with her PCP and was referred to the [facility]. She believes she was exposed to Covid by a friend who returned from [place] and stayed with them prior to the onset of her symptoms in 2020. The chronic symptoms over the last 13+ months include nonproductive cough, dyspnea, headache across the forehead and in the temples, chronic photophobia, sore throat, hoarseness, anosmia, "foggy brain", nausea, alternating soft-watery stools.  She also describes intermittent nausea.  Her major symptom is fatigue associated with these chronic issues.  She has been using ibuprofen and Benadryl prn for symptoms. She was monitoring O2 sats at home and employed oxygen that she had in her home for hypoxia. She had negative test for Covid in May 2020 and again in October. She received the first dose of Pfizer vaccine 02/15/2019.  On 3/4 she contacted her PCP with symptoms of headache, fever up to 101.2, coughing, and the ongoing fatigue.  Covid testing was again on 3/4.  She has not received the second vaccine because of the recurrent fevers.  Significant past history includes PSVT, OSA, tobacco abuse, NASH, essential hypertension, dyslipidemia, and anxiety/depression.  She is on Seroquel for bipolar 1 disorder. In her list of allergies it is stated that Seroquel causes fatigue.   Current Outpatient Medications:  .  albuterol (VENTOLIN HFA) 108 (90 Base) MCG/ACT inhaler, Inhale 2 puffs into the lungs every 6  (six) hours as needed for wheezing or shortness of breath., Disp: 1 Inhaler, Rfl: 2 .  clonazePAM (KLONOPIN) 0.5 MG tablet, Take 0.25 mg by mouth 2 (two) times daily. , Disp: , Rfl:  .  DULoxetine (CYMBALTA) 60 MG capsule, Take 60 mg by mouth daily. , Disp: , Rfl:  .  gabapentin (NEURONTIN) 300 MG capsule, Take 1 capsule (300 mg total) by mouth 3 (three) times daily. (Patient taking differently: Take 300 mg by mouth 2 (two) times daily. ), Disp: 90 capsule, Rfl: 3 .  levothyroxine (SYNTHROID) 88 MCG tablet, TAKE 1 TABLET BY MOUTH  DAILY, Disp: 90 tablet, Rfl: 1 .  melatonin 5 MG TABS, Take 5 mg by mouth at bedtime., Disp: , Rfl:  .  metoprolol succinate (TOPROL-XL) 25 MG 24 hr tablet, Take 25 mg by mouth daily., Disp: , Rfl:  .  mirabegron ER (MYRBETRIQ) 50 MG TB24 tablet, Take 50 mg by mouth daily., Disp: , Rfl:  .  Olopatadine HCl 0.2 % SOLN, Apply 1 drop to eye daily., Disp: 2.5 mL, Rfl: 11 .  QUEtiapine (SEROQUEL) 300 MG tablet, Take 300 mg by mouth at bedtime., Disp: , Rfl:  .  ropinirole (REQUIP) 5 MG tablet, Take 5 mg by mouth as needed. , Disp: , Rfl:  .  tizanidine (ZANAFLEX) 6 MG capsule, TAKE 1 CAPSULE BY MOUTH THREE TIMES A DAY AS NEEDED FOR MUSCLE SPASMS, Disp: 60 capsule, Rfl: 2 .  neomycin-polymyxin b-dexamethasone (MAXITROL) 3.5-10000-0.1 OINT, Place 1 application into both eyes 4 (  four) times daily., Disp: 3.5 g, Rfl: 1  Allergies  Allergen Reactions  . Ancef [Cefazolin Sodium] Anaphylaxis  . Penicillins Anaphylaxis  . Clarithromycin Itching  . Eggs Or Egg-Derived Products Itching  . Seroquel [Quetiapine Fumarate] Other (See Comments)    fatigue    Review of Systems she has exertional dyspnea in addition to the fatigue.  Because of Covid quarantine cardiac stress test was not completed last year as planned.   Her TSH is also not current.   Eyes: No redness, discharge, pain, vision change ENT/mouth: No nasal congestion,  purulent discharge, earache, change in hearing  Cardiovascular: No chest pain, palpitations, paroxysmal nocturnal dyspnea, claudication, edema  Respiratory: No sputum production, hemoptysis   Gastrointestinal: No heartburn, dysphagia, abdominal pain,  rectal bleeding, melena Genitourinary: No dysuria, hematuria, pyuria Dermatologic: No rash, pruritus, change in appearance of skin Neurologic: No dizziness, syncope, seizures, numbness, tingling Endocrine: No change in hair/skin/nails, excessive thirst, excessive hunger, excessive urination  Hematologic/lymphatic: No significant bruising, lymphadenopathy, abnormal bleeding Allergy/immunology: No itchy/watery eyes, significant sneezing, urticaria, angioedema  Objective:   BP 128/74 (BP Location: Left Arm, Cuff Size: Large)   Pulse 100   Temp 98.6 F (37 C) (Oral)   Ht 5' 5.5" (1.664 m)   Wt 274 lb 3.2 oz (124.4 kg)   SpO2 98%   BMI 44.94 kg/m   Physical Exam  Pertinent or positive findings: Pulse was rechecked and was 89.  She is morbidly obese.  There is minor nasal erythema present.  Abdomen is massive.    General appearance: Adequately nourished; no acute distress, increased work of breathing is present.   Lymphatic: No lymphadenopathy about the head, neck, axilla. Eyes: No conjunctival inflammation or lid edema is present. There is no scleral icterus. Ears:  External ear exam shows no significant lesions or deformities.   Nose:  External nasal examination shows no deformity or inflammation.  Oral exam:  Lips and gums are healthy appearing. There is no oropharyngeal erythema or exudate. Neck:  No thyromegaly, masses, tenderness noted.    Heart:  No gallop, murmur, click, rub .  Lungs: Chest clear to auscultation without wheezes, rhonchi, rales, rubs. Abdomen: Bowel sounds are normal. Abdomen is soft and nontender with no organomegaly, hernias, masses. GU: Deferred  Extremities:  No cyanosis, clubbing, edema  Skin: Warm & dry w/o tenting. No significant lesions or rash.    Results for orders placed or performed in visit on 03/29/19  C-reactive protein  Result Value Ref Range   CRP 4 0 - 10 mg/L  Basic metabolic panel  Result Value Ref Range   Glucose 148 (H) 65 - 99 mg/dL   BUN 16 8 - 27 mg/dL   Creatinine, Ser 0.80 0.57 - 1.00 mg/dL   GFR calc non Af Amer 77 >59 mL/min/1.73   GFR calc Af Amer 89 >59 mL/min/1.73   BUN/Creatinine Ratio 20 12 - 28   Sodium 140 134 - 144 mmol/L   Potassium 4.4 3.5 - 5.2 mmol/L   Chloride 102 96 - 106 mmol/L   CO2 24 20 - 29 mmol/L   Calcium 9.3 8.7 - 10.3 mg/dL  TSH  Result Value Ref Range   TSH 2.510 0.450 - 4.500 uIU/mL  CBC with Differential/Platelet  Result Value Ref Range   WBC 8.7 3.4 - 10.8 x10E3/uL   RBC 4.34 3.77 - 5.28 x10E6/uL   Hemoglobin 13.4 11.1 - 15.9 g/dL   Hematocrit 40.4 34.0 - 46.6 %   MCV 93 79 -  97 fL   MCH 30.9 26.6 - 33.0 pg   MCHC 33.2 31.5 - 35.7 g/dL   RDW 09.7 35.3 - 29.9 %   Platelets 309 150 - 450 x10E3/uL   Neutrophils 55 Not Estab. %   Lymphs 32 Not Estab. %   Monocytes 8 Not Estab. %   Eos 3 Not Estab. %   Basos 1 Not Estab. %   Neutrophils Absolute 4.9 1.4 - 7.0 x10E3/uL   Lymphocytes Absolute 2.8 0.7 - 3.1 x10E3/uL   Monocytes Absolute 0.7 0.1 - 0.9 x10E3/uL   EOS (ABSOLUTE) 0.2 0.0 - 0.4 x10E3/uL   Basophils Absolute 0.1 0.0 - 0.2 x10E3/uL   Immature Granulocytes 1 Not Estab. %   Immature Grans (Abs) 0.1 0.0 - 0.1 x10E3/uL  Sedimentation rate  Result Value Ref Range   Sed Rate 41 (H) 0 - 40 mm/hr      Assessment & Plan     See summary under each active problem in the Problem List with associated updated therapeutic plan     [facility]

## 2019-04-01 NOTE — Assessment & Plan Note (Signed)
BMET, CBC and differential, and TSH. If labs negative; further evaluation of the status of OSA and CAD are indicated as these could be the etiology of her symptoms.

## 2019-04-01 NOTE — Patient Instructions (Signed)
See assessment and plan under each diagnosis in the problem list and acutely for this visit 

## 2019-04-01 NOTE — Assessment & Plan Note (Addendum)
Extensive labs requested including inflammatory markers sed rate and C-reactive protein. Probiotic recommended for the alternating loose/watery stools.

## 2019-04-04 ENCOUNTER — Telehealth: Payer: Medicare Other | Admitting: Internal Medicine

## 2019-04-10 ENCOUNTER — Ambulatory Visit (INDEPENDENT_AMBULATORY_CARE_PROVIDER_SITE_OTHER): Payer: Medicare Other | Admitting: Psychology

## 2019-04-10 DIAGNOSIS — F4311 Post-traumatic stress disorder, acute: Secondary | ICD-10-CM | POA: Diagnosis not present

## 2019-04-10 DIAGNOSIS — F4481 Dissociative identity disorder: Secondary | ICD-10-CM | POA: Diagnosis not present

## 2019-04-24 ENCOUNTER — Ambulatory Visit: Payer: Medicare Other | Admitting: Psychology

## 2019-05-08 ENCOUNTER — Ambulatory Visit (INDEPENDENT_AMBULATORY_CARE_PROVIDER_SITE_OTHER): Payer: Medicare Other | Admitting: Psychology

## 2019-05-08 DIAGNOSIS — F4311 Post-traumatic stress disorder, acute: Secondary | ICD-10-CM

## 2019-05-08 DIAGNOSIS — F4481 Dissociative identity disorder: Secondary | ICD-10-CM

## 2019-05-22 ENCOUNTER — Ambulatory Visit: Payer: Medicare Other | Admitting: Psychology

## 2019-06-05 ENCOUNTER — Ambulatory Visit (INDEPENDENT_AMBULATORY_CARE_PROVIDER_SITE_OTHER): Payer: Medicare Other | Admitting: Psychology

## 2019-06-05 DIAGNOSIS — F4481 Dissociative identity disorder: Secondary | ICD-10-CM

## 2019-06-05 DIAGNOSIS — F4311 Post-traumatic stress disorder, acute: Secondary | ICD-10-CM | POA: Diagnosis not present

## 2019-06-19 ENCOUNTER — Ambulatory Visit: Payer: Medicare Other | Admitting: Psychology

## 2019-06-29 ENCOUNTER — Other Ambulatory Visit: Payer: Self-pay | Admitting: Internal Medicine

## 2019-07-03 ENCOUNTER — Ambulatory Visit: Payer: Medicare Other | Admitting: Psychology

## 2019-07-17 ENCOUNTER — Ambulatory Visit: Payer: Medicare Other | Admitting: Psychology

## 2019-07-20 ENCOUNTER — Other Ambulatory Visit: Payer: Self-pay | Admitting: Internal Medicine

## 2019-07-31 ENCOUNTER — Ambulatory Visit: Payer: Medicare Other | Admitting: Psychology

## 2019-08-14 ENCOUNTER — Ambulatory Visit: Payer: Medicare Other | Admitting: Psychology

## 2019-08-22 ENCOUNTER — Ambulatory Visit (INDEPENDENT_AMBULATORY_CARE_PROVIDER_SITE_OTHER): Payer: Medicare Other

## 2019-08-22 VITALS — Ht 65.5 in | Wt 274.0 lb

## 2019-08-22 DIAGNOSIS — Z0000 Encounter for general adult medical examination without abnormal findings: Secondary | ICD-10-CM | POA: Diagnosis not present

## 2019-08-22 NOTE — Progress Notes (Addendum)
Subjective:   Renee RotaLinda Rhinesmith is a 64 y.o. female who presents for an Initial Medicare Annual Wellness Visit.  Review of Systems    No ROS.  Medicare Wellness Virtual Visit.   Cardiac Risk Factors include: advanced age (>155men, 59>65 women);diabetes mellitus;hypertension     Objective:    Today's Vitals   08/22/19 1405  Weight: 274 lb (124.3 kg)  Height: 5' 5.5" (1.664 m)   Body mass index is 44.9 kg/m.  Advanced Directives 08/22/2019 02/14/2016 02/12/2016  Does Patient Have a Medical Advance Directive? No No No  Would patient like information on creating a medical advance directive? Yes (MAU/Ambulatory/Procedural Areas - Information given) - -    Current Medications (verified) Outpatient Encounter Medications as of 08/22/2019  Medication Sig   albuterol (VENTOLIN HFA) 108 (90 Base) MCG/ACT inhaler Inhale 2 puffs into the lungs every 6 (six) hours as needed for wheezing or shortness of breath.   clonazePAM (KLONOPIN) 0.5 MG tablet Take 0.25 mg by mouth 2 (two) times daily.    DULoxetine (CYMBALTA) 60 MG capsule Take 60 mg by mouth daily.    gabapentin (NEURONTIN) 300 MG capsule Take 1 capsule (300 mg total) by mouth 3 (three) times daily. (Patient taking differently: Take 300 mg by mouth 2 (two) times daily. )   levothyroxine (SYNTHROID) 88 MCG tablet TAKE 1 TABLET BY MOUTH  DAILY   melatonin 5 MG TABS Take 5 mg by mouth at bedtime.   metoprolol succinate (TOPROL-XL) 25 MG 24 hr tablet TAKE 1 TABLET BY MOUTH  DAILY   mirabegron ER (MYRBETRIQ) 50 MG TB24 tablet Take 50 mg by mouth daily.   neomycin-polymyxin b-dexamethasone (MAXITROL) 3.5-10000-0.1 OINT Place 1 application into both eyes 4 (four) times daily.   Olopatadine HCl 0.2 % SOLN Apply 1 drop to eye daily.   QUEtiapine (SEROQUEL) 300 MG tablet Take 300 mg by mouth at bedtime.   ropinirole (REQUIP) 5 MG tablet Take 5 mg by mouth as needed.    tizanidine (ZANAFLEX) 6 MG capsule TAKE 1 CAPSULE BY MOUTH THREE TIMES A DAY AS NEEDED  FOR MUSCLE SPASMS   No facility-administered encounter medications on file as of 08/22/2019.    Allergies (verified) Ancef [cefazolin sodium], Penicillins, Clarithromycin, Eggs or egg-derived products, and Seroquel [quetiapine fumarate]   History: Past Medical History:  Diagnosis Date   Anxiety    Arthritis    Atopic dermatitis    scalp   Depression    Hyperlipidemia    Hypertension    pt denies   Thyroid disease    Tobacco abuse    Past Surgical History:  Procedure Laterality Date   LUMBAR DISC SURGERY     ROTATOR CUFF REPAIR Bilateral    Family History  Problem Relation Age of Onset   Arthritis Mother    Macular degeneration Mother    Heart disease Father    Breast cancer Neg Hx    Social History   Socioeconomic History   Marital status: Married    Spouse name: Not on file   Number of children: Not on file   Years of education: Not on file   Highest education level: Not on file  Occupational History   Not on file  Tobacco Use   Smoking status: Former Smoker    Packs/day: 0.25    Types: Cigarettes    Quit date: 01/02/2016    Years since quitting: 3.6   Smokeless tobacco: Never Used  Substance and Sexual Activity   Alcohol use: Yes  Comment: once a week    Drug use: No   Sexual activity: Not on file  Other Topics Concern   Not on file  Social History Narrative   Not on file   Social Determinants of Health   Financial Resource Strain: Low Risk    Difficulty of Paying Living Expenses: Not hard at all  Food Insecurity: No Food Insecurity   Worried About Running Out of Food in the Last Year: Never true   Ran Out of Food in the Last Year: Never true  Transportation Needs: No Transportation Needs   Lack of Transportation (Medical): No   Lack of Transportation (Non-Medical): No  Physical Activity:    Days of Exercise per Week: Not on file   Minutes of Exercise per Session: Not on file  Stress: No Stress Concern Present   Feeling of Stress : Not at  all  Social Connections: Unknown   Frequency of Communication with Friends and Family: Not on file   Frequency of Social Gatherings with Friends and Family: Not on file   Attends Religious Services: Not on Scientist, clinical (histocompatibility and immunogenetics) or Organizations: Not on file   Attends Banker Meetings: Not on file   Marital Status: Married    Tobacco Counseling Counseling given: Not Answered   Clinical Intake:  Pre-visit preparation completed: Yes        Diabetes: Yes (Followed by pcp)  How often do you need to have someone help you when you read instructions, pamphlets, or other written materials from your doctor or pharmacy?: 1 - Never  Interpreter Needed?: No      Activities of Daily Living In your present state of health, do you have any difficulty performing the following activities: 08/22/2019 03/29/2019  Hearing? Y N  Comment Hearing aids -  Vision? N N  Difficulty concentrating or making decisions? N N  Walking or climbing stairs? N N  Dressing or bathing? N N  Doing errands, shopping? N N  Preparing Food and eating ? N -  Using the Toilet? N -  In the past six months, have you accidently leaked urine? N -  Do you have problems with loss of bowel control? N -  Managing your Medications? N -  Managing your Finances? N -  Housekeeping or managing your Housekeeping? N -  Some recent data might be hidden    Patient Care Team: Sherlene Shams, MD as PCP - General (Internal Medicine)  Indicate any recent Medical Services you may have received from other than [facility] providers in the past year (date may be approximate).     Assessment:   This is a routine wellness examination for Foster.  I connected with Maeve today by telephone and verified that I am speaking with the correct person using two identifiers. Location patient: home Location provider: work Persons participating in the virtual visit: patient, Engineer, civil (consulting).    I discussed the limitations, risks,  security and privacy concerns of performing an evaluation and management service by telephone and the availability of in person appointments. The patient expressed understanding and verbally consented to this telephonic visit.    Interactive audio and video telecommunications were attempted between this provider and patient, however failed, due to patient having technical difficulties OR patient did not have access to video capability.  We continued and completed visit with audio only.  Some vital signs may be absent or patient reported.   Hearing/Vision screen  Hearing Screening   125Hz  250Hz   500Hz  1000Hz  2000Hz  3000Hz  4000Hz  6000Hz  8000Hz   Right ear:           Left ear:           Comments: Hearing aid, bilateral   Vision Screening Comments: Wears corrective lenses Visual acuity not assessed, virtual visit.  They have seen their ophthalmologist in the last 12 months.    Dietary issues and exercise activities discussed: Current Exercise Habits: The patient does not participate in regular exercise at present Low carb intake  Fair water intake   Goals       Patient Stated     I would like to lose weight (pt-stated)      Low GI Diet mailed to patient per request.        Depression Screen PHQ 2/9 Scores 08/22/2019 05/17/2018 12/14/2015  PHQ - 2 Score 0 0 0    Fall Risk Fall Risk  08/22/2019 09/18/2018 05/17/2018 03/08/2016 12/14/2015  Falls in the past year? 0 0 0 No No  Number falls in past yr: 0 - - - -  Injury with Fall? - - - - -  Follow up Falls evaluation completed Falls evaluation completed - - -   Handrails in use when climbing stairs? Yes  Home free of loose throw rugs in walkways, pet beds, electrical cords, etc? Yes  Adequate lighting in your home to reduce risk of falls? Yes   ASSISTIVE DEVICES UTILIZED TO PREVENT FALLS:  Life alert? No  Use of a cane, walker or w/c? No  Grab bars in the bathroom? No  Shower chair or bench in shower? No  Elevated toilet seat or  a handicapped toilet? No   TIMED UP AND GO:  Was the test performed? No . Virtual visit.   Cognitive Function: MMSE - Mini Mental State Exam 09/24/2015  Not completed: Unable to complete     6CIT Screen 08/22/2019  What Year? 0 points  What month? 0 points  What time? 0 points  Months in reverse 0 points    Immunizations Immunization History  Administered Date(s) Administered   Hep A / Hep B 04/11/2011, 05/11/2011, 11/04/2011   Influenza-Unspecified 10/09/2015, 11/04/2017   PFIZER SARS-COV-2 Vaccination 02/15/2019, 03/31/2019   Pneumococcal Polysaccharide-23 08/27/2015   Tdap 09/09/2013     Health Maintenance Health Maintenance  Topic Date Due   Hepatitis C Screening  Never done   FOOT EXAM  Never done   URINE MICROALBUMIN  10/08/2016   PNA vac Low Risk Adult (1 of 2 - PCV13) 03/10/2018   HEMOGLOBIN A1C  04/05/2019   INFLUENZA VACCINE  08/04/2019   MAMMOGRAM  11/21/2019   OPHTHALMOLOGY EXAM  06/10/2020   COLONOSCOPY  04/16/2023   TETANUS/TDAP  09/10/2023   DEXA SCAN  Completed   COVID-19 Vaccine  Completed   Cpap- not in use  Dental Screening: Recommended annual dental exams for proper oral hygiene. Visits every 6-12 months.   Community Resource Referral / Chronic Care Management: CRR required this visit?  No   CCM required this visit?  No      Plan:   Keep all routine maintenance appointments.   I have personally reviewed and noted the following in the patient's chart:   Medical and social history Use of alcohol, tobacco or illicit drugs  Current medications and supplements Functional ability and status Nutritional status Physical activity Advanced directives List of other physicians Hospitalizations, surgeries, and ER visits in previous 12 months Vitals Screenings to include cognitive, depression, and falls Referrals and  appointments  In addition, I have reviewed and discussed with patient certain preventive protocols, quality metrics, and  best practice recommendations. A written personalized care plan for preventive services as well as general preventive health recommendations were provided to patient via mychart.     OBrien-Blaney, Domonic Kimball L, LPN   05/31/4130      I have reviewed the above information and agree with above.   Duncan Dull, MD

## 2019-08-22 NOTE — Patient Instructions (Addendum)
[name] , Thank you for taking time to come for your Medicare Wellness Visit. I appreciate your ongoing commitment to your health goals. Please review the following plan we discussed and let me know if I can assist you in the future.   These are the goals we discussed: Goals      Patient Stated   .  I would like to lose weight (pt-stated)      Low GI Diet mailed to patient per request.        This is a list of the screening recommended for you and due dates:  Health Maintenance  Topic Date Due  .  Hepatitis C: One time screening is recommended by Center for Disease Control  (CDC) for  adults born from [birth year] through 1967.   Never done  . Complete foot exam   Never done  . Urine Protein Check  10/08/2016  . Pneumonia vaccines (1 of 2 - PCV13) 03/10/2018  . Hemoglobin A1C  04/05/2019  . Flu Shot  08/04/2019  . Mammogram  11/21/2019  . Eye exam for diabetics  06/10/2020  . Colon Cancer Screening  04/16/2023  . Tetanus Vaccine  09/10/2023  . DEXA scan (bone density measurement)  Completed  . COVID-19 Vaccine  Completed    Immunizations Immunization History  Administered Date(s) Administered  . Hep A / Hep B 04/11/2011, 05/11/2011, 11/04/2011  . Influenza-Unspecified 10/09/2015, 11/04/2017  . PFIZER SARS-COV-2 Vaccination 02/15/2019, 03/31/2019  . Pneumococcal Polysaccharide-23 08/27/2015  . Tdap 09/09/2013   Advanced directives: mailed to patient per request.  Conditions/risks identified: none new  Follow up in one year for your annual wellness visit.  Dr. Melina Schools Low GI Diet mailed.    Preventive Care 66 Years and Older, Female Preventive care refers to lifestyle choices and visits with your health care provider that can promote health and wellness. What does preventive care include?  A yearly physical exam. This is also called an annual well check.  Dental exams once or twice a year.  Routine eye exams. Ask your health care provider how often you should have  your eyes checked.  Personal lifestyle choices, including:  Daily care of your teeth and gums.  Regular physical activity.  Eating a healthy diet.  Avoiding tobacco and drug use.  Limiting alcohol use.  Practicing safe sex.  Taking low-dose aspirin every day.  Taking vitamin and mineral supplements as recommended by your health care provider. What happens during an annual well check? The services and screenings done by your health care provider during your annual well check will depend on your age, overall health, lifestyle risk factors, and family history of disease. Counseling  Your health care provider may ask you questions about your:  Alcohol use.  Tobacco use.  Drug use.  Emotional well-being.  Home and relationship well-being.  Sexual activity.  Eating habits.  History of falls.  Memory and ability to understand (cognition).  Work and work Astronomer.  Reproductive health. Screening  You may have the following tests or measurements:  Height, weight, and BMI.  Blood pressure.  Lipid and cholesterol levels. These may be checked every 5 years, or more frequently if you are over 44 years old.  Skin check.  Lung cancer screening. You may have this screening every year starting at age 65 if you have a 30-pack-year history of smoking and currently smoke or have quit within the past 15 years.  Fecal occult blood test (FOBT) of the stool. You may have this  test every year starting at age 41.  Flexible sigmoidoscopy or colonoscopy. You may have a sigmoidoscopy every 5 years or a colonoscopy every 10 years starting at age 23.  Hepatitis C blood test.  Hepatitis B blood test.  Sexually transmitted disease (STD) testing.  Diabetes screening. This is done by checking your blood sugar (glucose) after you have not eaten for a while (fasting). You may have this done every 1-3 years.  Bone density scan. This is done to screen for osteoporosis. You may have  this done starting at age 73.  Mammogram. This may be done every 1-2 years. Talk to your health care provider about how often you should have regular mammograms. Talk with your health care provider about your test results, treatment options, and if necessary, the need for more tests. Vaccines  Your health care provider may recommend certain vaccines, such as:  Influenza vaccine. This is recommended every year.  Tetanus, diphtheria, and acellular pertussis (Tdap, Td) vaccine. You may need a Td booster every 10 years.  Zoster vaccine. You may need this after age 78.  Pneumococcal 13-valent conjugate (PCV13) vaccine. One dose is recommended after age 73.  Pneumococcal polysaccharide (PPSV23) vaccine. One dose is recommended after age 31. Talk to your health care provider about which screenings and vaccines you need and how often you need them. This information is not intended to replace advice given to you by your health care provider. Make sure you discuss any questions you have with your health care provider. Document Released: 01/16/2015 Document Revised: 09/09/2015 Document Reviewed: 10/21/2014 Elsevier Interactive Patient Education  2017 [employer].  Fall Prevention in the Home Falls can cause injuries. They can happen to people of all ages. There are many things you can do to make your home safe and to help prevent falls. What can I do on the outside of my home?  Regularly fix the edges of walkways and driveways and fix any cracks.  Remove anything that might make you trip as you walk through a door, such as a raised step or threshold.  Trim any bushes or trees on the path to your home.  Use bright outdoor lighting.  Clear any walking paths of anything that might make someone trip, such as rocks or tools.  Regularly check to see if handrails are loose or broken. Make sure that both sides of any steps have handrails.  Any raised decks and porches should have guardrails on  the edges.  Have any leaves, snow, or ice cleared regularly.  Use sand or salt on walking paths during winter.  Clean up any spills in your garage right away. This includes oil or grease spills. What can I do in the bathroom?  Use night lights.  Install grab bars by the toilet and in the tub and shower. Do not use towel bars as grab bars.  Use non-skid mats or decals in the tub or shower.  If you need to sit down in the shower, use a plastic, non-slip stool.  Keep the floor dry. Clean up any water that spills on the floor as soon as it happens.  Remove soap buildup in the tub or shower regularly.  Attach bath mats securely with double-sided non-slip rug tape.  Do not have throw rugs and other things on the floor that can make you trip. What can I do in the bedroom?  Use night lights.  Make sure that you have a light by your bed that is easy to reach.  Do not use any sheets or blankets that are too big for your bed. They should not hang down onto the floor.  Have a firm chair that has side arms. You can use this for support while you get dressed.  Do not have throw rugs and other things on the floor that can make you trip. What can I do in the kitchen?  Clean up any spills right away.  Avoid walking on wet floors.  Keep items that you use a lot in easy-to-reach places.  If you need to reach something above you, use a strong step stool that has a grab bar.  Keep electrical cords out of the way.  Do not use floor polish or wax that makes floors slippery. If you must use wax, use non-skid floor wax.  Do not have throw rugs and other things on the floor that can make you trip. What can I do with my stairs?  Do not leave any items on the stairs.  Make sure that there are handrails on both sides of the stairs and use them. Fix handrails that are broken or loose. Make sure that handrails are as long as the stairways.  Check any carpeting to make sure that it is firmly  attached to the stairs. Fix any carpet that is loose or worn.  Avoid having throw rugs at the top or bottom of the stairs. If you do have throw rugs, attach them to the floor with carpet tape.  Make sure that you have a light switch at the top of the stairs and the bottom of the stairs. If you do not have them, ask someone to add them for you. What else can I do to help prevent falls?  Wear shoes that:  Do not have high heels.  Have rubber bottoms.  Are comfortable and fit you well.  Are closed at the toe. Do not wear sandals.  If you use a stepladder:  Make sure that it is fully opened. Do not climb a closed stepladder.  Make sure that both sides of the stepladder are locked into place.  Ask someone to hold it for you, if possible.  Clearly mark and make sure that you can see:  Any grab bars or handrails.  First and last steps.  Where the edge of each step is.  Use tools that help you move around (mobility aids) if they are needed. These include:  Canes.  Walkers.  Scooters.  Crutches.  Turn on the lights when you go into a dark area. Replace any light bulbs as soon as they burn out.  Set up your furniture so you have a clear path. Avoid moving your furniture around.  If any of your floors are uneven, fix them.  If there are any pets around you, be aware of where they are.  Review your medicines with your doctor. Some medicines can make you feel dizzy. This can increase your chance of falling. Ask your doctor what other things that you can do to help prevent falls. This information is not intended to replace advice given to you by your health care provider. Make sure you discuss any questions you have with your health care provider. Document Released: 10/16/2008 Document Revised: 05/28/2015 Document Reviewed: 01/24/2014 Elsevier Interactive Patient Education  2017 [employer].

## 2019-08-28 ENCOUNTER — Ambulatory Visit: Payer: Medicare Other | Admitting: Psychology

## 2019-08-28 ENCOUNTER — Ambulatory Visit (INDEPENDENT_AMBULATORY_CARE_PROVIDER_SITE_OTHER): Payer: Medicare Other | Admitting: Psychology

## 2019-08-28 DIAGNOSIS — F4481 Dissociative identity disorder: Secondary | ICD-10-CM

## 2019-08-28 DIAGNOSIS — F4311 Post-traumatic stress disorder, acute: Secondary | ICD-10-CM | POA: Diagnosis not present

## 2019-09-11 ENCOUNTER — Ambulatory Visit: Payer: Medicare Other | Admitting: Psychology

## 2019-09-25 ENCOUNTER — Ambulatory Visit (INDEPENDENT_AMBULATORY_CARE_PROVIDER_SITE_OTHER): Payer: Medicare Other | Admitting: Psychology

## 2019-09-25 DIAGNOSIS — F3181 Bipolar II disorder: Secondary | ICD-10-CM | POA: Diagnosis not present

## 2019-10-15 DIAGNOSIS — G8929 Other chronic pain: Secondary | ICD-10-CM | POA: Diagnosis not present

## 2019-10-15 DIAGNOSIS — M25561 Pain in right knee: Secondary | ICD-10-CM | POA: Diagnosis not present

## 2019-10-23 ENCOUNTER — Ambulatory Visit: Payer: Medicare Other | Admitting: Psychology

## 2019-11-20 ENCOUNTER — Ambulatory Visit: Payer: Medicare Other | Admitting: Psychology

## 2019-12-12 ENCOUNTER — Other Ambulatory Visit: Payer: Self-pay | Admitting: Internal Medicine

## 2019-12-18 ENCOUNTER — Ambulatory Visit: Payer: Medicare Other | Admitting: Psychology

## 2020-01-15 ENCOUNTER — Ambulatory Visit: Payer: Medicare Other | Admitting: Psychology

## 2020-02-12 ENCOUNTER — Ambulatory Visit: Payer: Medicare Other | Admitting: Psychology

## 2020-02-26 LAB — BASIC METABOLIC PANEL
BUN: 15 (ref 4–21)
Creatinine: 1 (ref 0.5–1.1)
Glucose: 132
Potassium: 4.9 (ref 3.4–5.3)
Sodium: 138 (ref 137–147)

## 2020-02-26 LAB — HEMOGLOBIN A1C: Hemoglobin A1C: 6.3

## 2020-02-26 LAB — TSH: TSH: 2.9 (ref 0.41–5.90)

## 2020-02-26 LAB — CBC AND DIFFERENTIAL
HCT: 42 (ref 36–46)
Hemoglobin: 13.3 (ref 12.0–16.0)
Platelets: 302 (ref 150–399)
WBC: 12.8

## 2020-02-26 LAB — HEPATIC FUNCTION PANEL
ALT: 80 — AB (ref 7–35)
AST: 23 (ref 13–35)
Alkaline Phosphatase: 124 (ref 25–125)
Bilirubin, Total: 0.3

## 2020-02-26 LAB — LIPID PANEL
Cholesterol: 204 — AB (ref 0–200)
HDL: 58 (ref 35–70)
LDL Cholesterol: 118
Triglycerides: 162 — AB (ref 40–160)

## 2020-03-11 ENCOUNTER — Ambulatory Visit: Payer: Medicare Other | Admitting: Psychology

## 2020-03-19 ENCOUNTER — Telehealth (INDEPENDENT_AMBULATORY_CARE_PROVIDER_SITE_OTHER): Payer: Medicare Other | Admitting: Internal Medicine

## 2020-03-19 ENCOUNTER — Encounter: Payer: Self-pay | Admitting: Internal Medicine

## 2020-03-19 ENCOUNTER — Other Ambulatory Visit: Payer: Self-pay

## 2020-03-19 VITALS — Ht 65.5 in | Wt 274.0 lb

## 2020-03-19 DIAGNOSIS — R1013 Epigastric pain: Secondary | ICD-10-CM

## 2020-03-19 DIAGNOSIS — J329 Chronic sinusitis, unspecified: Secondary | ICD-10-CM | POA: Diagnosis not present

## 2020-03-19 DIAGNOSIS — D72829 Elevated white blood cell count, unspecified: Secondary | ICD-10-CM

## 2020-03-19 DIAGNOSIS — R112 Nausea with vomiting, unspecified: Secondary | ICD-10-CM

## 2020-03-19 DIAGNOSIS — R748 Abnormal levels of other serum enzymes: Secondary | ICD-10-CM | POA: Diagnosis not present

## 2020-03-19 DIAGNOSIS — K760 Fatty (change of) liver, not elsewhere classified: Secondary | ICD-10-CM

## 2020-03-19 DIAGNOSIS — R7303 Prediabetes: Secondary | ICD-10-CM

## 2020-03-19 DIAGNOSIS — Z13818 Encounter for screening for other digestive system disorders: Secondary | ICD-10-CM

## 2020-03-19 DIAGNOSIS — E785 Hyperlipidemia, unspecified: Secondary | ICD-10-CM | POA: Diagnosis not present

## 2020-03-19 MED ORDER — AZITHROMYCIN 250 MG PO TABS: ORAL_TABLET | ORAL | 0 refills | Status: DC

## 2020-03-19 MED ORDER — SALINE SPRAY 0.65 % NA SOLN: 2.0000 | NASAL | 2 refills | Status: DC | PRN

## 2020-03-19 MED ORDER — ONDANSETRON HCL 4 MG PO TABS: 4.0000 mg | ORAL_TABLET | Freq: Three times a day (TID) | ORAL | 0 refills | Status: AC | PRN

## 2020-03-19 NOTE — Progress Notes (Signed)
Patient states she has not been feeling well for a while. Has drainage from the nose to the throat, green and bloody. Patient had to cough this out.  Patient states she had labs done and her WBC were elevated. These labs were done by nutritionist.  Ongoing symptoms for months.

## 2020-04-01 ENCOUNTER — Telehealth: Payer: Self-pay | Admitting: Internal Medicine

## 2020-04-01 ENCOUNTER — Encounter: Payer: Self-pay | Admitting: Internal Medicine

## 2020-04-01 DIAGNOSIS — R748 Abnormal levels of other serum enzymes: Secondary | ICD-10-CM | POA: Insufficient documentation

## 2020-04-01 DIAGNOSIS — R7303 Prediabetes: Secondary | ICD-10-CM | POA: Insufficient documentation

## 2020-04-01 NOTE — Progress Notes (Signed)
Virtual Visit via Video Note  I connected with [name]   on 04/01/20 at  3:30 PM EDT by a video enabled telemedicine application and verified that I am speaking with the correct person using two identifiers.  Location patient: home, [place] Location provider:work or home office Persons participating in the virtual visit: patient, provider, pts husband  I discussed the limitations of evaluation and management by telemedicine and the availability of in person appointments. The patient expressed understanding and agreed to proceed.   HPI: 1. H/o allergies est with Dr. Elenore Frye ENT with green bloody nasal discharge and smell c/w infection sinusitis current sxs started 02/27/20   2.  she also has had nausea/vomitting and upper abdomen pain which is better ? H/o GERD. She did a covid test and was negative though had covid in 2020 and 2021. She has also had constipation. She is concerned due to labs 02/26/20 neutrophils in labs high 9.4 normal reference range 1.4-7.0 WBC 12.8 had labs with nutrition at [facility], Creatinine 1.04 GFR 56 and glucose 132, AST 23 nl, AP 124 sl high nl 121, ALT 80 nl 32 she denies etoh use   3. HLD total cholesterol 204, TG 162, LDL 118 A1C 6.3  TSH 2.930  CRP 9.4 H  Insulin 31.0 H  -COVID-19 vaccine status: 3/3   ROS: See pertinent positives and negatives per HPI.  Past Medical History:  Diagnosis Date  . Anxiety   . Arthritis   . Atopic dermatitis    scalp  . Depression   . Hyperlipidemia   . Hypertension    pt denies  . Thyroid disease   . Tobacco abuse     Past Surgical History:  Procedure Laterality Date  . LUMBAR DISC SURGERY    . ROTATOR CUFF REPAIR Bilateral      Current Outpatient Medications:  .  albuterol (VENTOLIN HFA) 108 (90 Base) MCG/ACT inhaler, Inhale 2 puffs into the lungs every 6 (six) hours as needed for wheezing or shortness of breath., Disp: 1 Inhaler, Rfl: 2 .  azithromycin (ZITHROMAX) 250 MG tablet, 2 pills day 1 and 1 pill day  2-5 with food, Disp: 6 tablet, Rfl: 0 .  clonazePAM (KLONOPIN) 0.5 MG tablet, Take 0.25 mg by mouth 2 (two) times daily. , Disp: , Rfl:  .  DULoxetine (CYMBALTA) 60 MG capsule, Take 60 mg by mouth daily. , Disp: , Rfl:  .  gabapentin (NEURONTIN) 300 MG capsule, Take 1 capsule (300 mg total) by mouth 3 (three) times daily. (Patient taking differently: Take 300 mg by mouth 2 (two) times daily.), Disp: 90 capsule, Rfl: 3 .  levothyroxine (SYNTHROID) 88 MCG tablet, TAKE 1 TABLET BY MOUTH  DAILY, Disp: 90 tablet, Rfl: 3 .  melatonin 5 MG TABS, Take 5 mg by mouth at bedtime., Disp: , Rfl:  .  metoprolol succinate (TOPROL-XL) 25 MG 24 hr tablet, TAKE 1 TABLET BY MOUTH  DAILY, Disp: 90 tablet, Rfl: 3 .  mirabegron ER (MYRBETRIQ) 50 MG TB24 tablet, Take 50 mg by mouth daily., Disp: , Rfl:  .  ondansetron (ZOFRAN) 4 MG tablet, Take 1 tablet (4 mg total) by mouth every 8 (eight) hours as needed for nausea or vomiting., Disp: 40 tablet, Rfl: 0 .  QUEtiapine (SEROQUEL) 300 MG tablet, Take 300 mg by mouth at bedtime., Disp: , Rfl:  .  ropinirole (REQUIP) 5 MG tablet, Take 5 mg by mouth as needed. , Disp: , Rfl:  .  sodium chloride (OCEAN) 0.65 %  SOLN nasal spray, Place 2 sprays into both nostrils as needed for congestion., Disp: 30 mL, Rfl: 2 .  tizanidine (ZANAFLEX) 6 MG capsule, TAKE 1 CAPSULE BY MOUTH THREE TIMES A DAY AS NEEDED FOR MUSCLE SPASMS, Disp: 60 capsule, Rfl: 2  EXAM:  VITALS per patient if applicable:  GENERAL: alert, oriented, appears well and in no acute distress  HEENT: atraumatic, conjunttiva clear, no obvious abnormalities on inspection of external nose and ears  NECK: normal movements of the head and neck  LUNGS: on inspection no signs of respiratory distress, breathing rate appears normal, no obvious gross SOB, gasping or wheezing  CV: no obvious cyanosis  MS: moves all visible extremities without noticeable abnormality  PSYCH/NEURO: pleasant and cooperative, no obvious  depression or anxiety, speech and thought processing grossly intact  ASSESSMENT AND PLAN:  Discussed the following assessment and plan:  Sinusitis, unspecified chronicity, unspecified location - Plan: azithromycin (ZITHROMAX) 250 MG tablet, sodium chloride (OCEAN) 0.65 % SOLN nasal spray, cont flonase, ondansetron (ZOFRAN) 4 MG tablet Continue otc antihistamine using benadryl  Established Dr. Elenore Frye call and sch appt if sx's continue   Nausea and vomiting, epigastric ab pain h/o fatty liver and elevated lfts - Plan: ondansetron (ZOFRAN) 4 MG tablet, US Abdomen Complete Repeat labs [facility] cmet, cbc, hep B/C, ggt, alkaline phos   Prediabetes Hyperlipidemia, unspecified hyperlipidemia type F/u PCP and  Nutrition   -we discussed possible serious and likely etiologies, options for evaluation and workup, limitations of telemedicine visit vs in person visit, treatment, treatment risks and precautions.   Advised to seek prompt in person care if worsening, new symptoms arise, or if is not improving with treatment. Discussed options for inperson care if PCP office not available. Did let this patient know that I only do telemedicine on Tuesdays and Thursdays for [place]. Advised to schedule follow up visit with PCP or [facility] if any further questions or concerns to avoid delays in care.   I discussed the assessment and treatment plan with the patient. The patient was provided an opportunity to ask questions and all were answered. The patient agreed with the plan and demonstrated an understanding of the instructions.    Time spent 20 min Bevelyn Buckles, MD

## 2020-04-01 NOTE — Telephone Encounter (Signed)
Ordered labs pt at [facility] please make sure goes thanks

## 2020-04-02 NOTE — Telephone Encounter (Signed)
Please inform the Patient that orders were placed and she will ned to have these done at [facility] as soon as she can.

## 2020-04-02 NOTE — Telephone Encounter (Signed)
LVM no answer. Instructed pt to return call as soon as possible.  

## 2020-04-06 ENCOUNTER — Telehealth: Payer: Self-pay | Admitting: Internal Medicine

## 2020-04-06 NOTE — Telephone Encounter (Signed)
Per my last visit with pt ive requested she go to [facility] and have f/u labs  Orders in pt has not gone yet  If she would like please go to follow up on abnormal labs  Thank you

## 2020-04-07 ENCOUNTER — Other Ambulatory Visit: Payer: Self-pay

## 2020-04-07 ENCOUNTER — Ambulatory Visit
Admission: RE | Admit: 2020-04-07 | Discharge: 2020-04-07 | Disposition: A | Discharge status: Home / Self Care | Payer: Medicare Other | Source: Ambulatory Visit | Attending: Internal Medicine | Admitting: Internal Medicine

## 2020-04-07 DIAGNOSIS — Z13818 Encounter for screening for other digestive system disorders: Secondary | ICD-10-CM | POA: Diagnosis not present

## 2020-04-07 DIAGNOSIS — R1013 Epigastric pain: Secondary | ICD-10-CM | POA: Insufficient documentation

## 2020-04-07 DIAGNOSIS — R112 Nausea with vomiting, unspecified: Secondary | ICD-10-CM | POA: Insufficient documentation

## 2020-04-07 DIAGNOSIS — K760 Fatty (change of) liver, not elsewhere classified: Secondary | ICD-10-CM | POA: Diagnosis not present

## 2020-04-07 DIAGNOSIS — D72829 Elevated white blood cell count, unspecified: Secondary | ICD-10-CM | POA: Diagnosis not present

## 2020-04-07 DIAGNOSIS — K8020 Calculus of gallbladder without cholecystitis without obstruction: Secondary | ICD-10-CM | POA: Diagnosis not present

## 2020-04-07 DIAGNOSIS — R748 Abnormal levels of other serum enzymes: Secondary | ICD-10-CM | POA: Diagnosis not present

## 2020-04-07 NOTE — Telephone Encounter (Signed)
LMTCB

## 2020-04-08 ENCOUNTER — Ambulatory Visit: Payer: Medicare Other | Admitting: Psychology

## 2020-04-09 NOTE — Telephone Encounter (Signed)
Labs have completed placed in yellow results folder.

## 2020-04-09 NOTE — Telephone Encounter (Signed)
Pt. Has already had labs done at [facility] and has received her results.

## 2020-04-11 LAB — COMPREHENSIVE METABOLIC PANEL
ALT: 26 IU/L (ref 0–32)
AST: 16 IU/L (ref 0–40)
Albumin/Globulin Ratio: 1.8 (ref 1.2–2.2)
Albumin: 4.4 g/dL (ref 3.8–4.8)
Alkaline Phosphatase: 59 IU/L (ref 44–121)
BUN/Creatinine Ratio: 22 (ref 12–28)
BUN: 22 mg/dL (ref 8–27)
Bilirubin Total: 0.3 mg/dL (ref 0.0–1.2)
CO2: 21 mmol/L (ref 20–29)
Calcium: 9.3 mg/dL (ref 8.7–10.3)
Chloride: 103 mmol/L (ref 96–106)
Creatinine, Ser: 0.99 mg/dL (ref 0.57–1.00)
Globulin, Total: 2.4 g/dL (ref 1.5–4.5)
Glucose: 141 mg/dL — ABNORMAL HIGH (ref 65–99)
Potassium: 4.8 mmol/L (ref 3.5–5.2)
Sodium: 142 mmol/L (ref 134–144)
Total Protein: 6.8 g/dL (ref 6.0–8.5)
eGFR: 62 mL/min/{1.73_m2} (ref >59)

## 2020-04-11 LAB — CBC WITH DIFFERENTIAL/PLATELET
Basophils Absolute: 0.1 10*3/uL (ref 0.0–0.2)
Basos: 1 %
EOS (ABSOLUTE): 0.5 10*3/uL — ABNORMAL HIGH (ref 0.0–0.4)
Eos: 7 %
Hematocrit: 40.8 % (ref 34.0–46.6)
Hemoglobin: 13.4 g/dL (ref 11.1–15.9)
Immature Grans (Abs): 0 10*3/uL (ref 0.0–0.1)
Immature Granulocytes: 0 %
Lymphocytes Absolute: 2.2 10*3/uL (ref 0.7–3.1)
Lymphs: 29 %
MCH: 30.4 pg (ref 26.6–33.0)
MCHC: 32.8 g/dL (ref 31.5–35.7)
MCV: 93 fL (ref 79–97)
Monocytes Absolute: 0.6 10*3/uL (ref 0.1–0.9)
Monocytes: 8 %
Neutrophils Absolute: 4.2 10*3/uL (ref 1.4–7.0)
Neutrophils: 55 %
Platelets: 276 10*3/uL (ref 150–450)
RBC: 4.41 x10E6/uL (ref 3.77–5.28)
RDW: 14.3 % (ref 11.7–15.4)
WBC: 7.5 10*3/uL (ref 3.4–10.8)

## 2020-04-11 LAB — GAMMA GT: GGT: 53 IU/L (ref 0–60)

## 2020-04-11 LAB — HEPATITIS B SURFACE ANTIBODY, QUANTITATIVE: Hepatitis B Surf Ab Quant: >1000 m[IU]/mL (ref >9.9)

## 2020-04-11 LAB — ALKALINE PHOSPHATASE, ISOENZYMES
BONE FRACTION: 36 % (ref 14–68)
INTESTINAL FRAC.: 0 % (ref 0–18)
LIVER FRACTION: 64 % (ref 18–85)

## 2020-04-11 LAB — HEPATITIS B SURFACE ANTIGEN: Hepatitis B Surface Ag: NEGATIVE

## 2020-04-11 LAB — HEPATITIS C ANTIBODY: Hep C Virus Ab: <0.1 s/co ratio (ref 0.0–0.9)

## 2020-04-21 ENCOUNTER — Telehealth: Payer: Self-pay | Admitting: Internal Medicine

## 2020-04-21 DIAGNOSIS — E1169 Type 2 diabetes mellitus with other specified complication: Secondary | ICD-10-CM

## 2020-04-21 NOTE — Telephone Encounter (Signed)
I believe these were labs that Dr. French Ana had ordered for her. Pt is scheduled for an appointment with you in June.

## 2020-04-21 NOTE — Telephone Encounter (Signed)
I received fasting labs from February,  Not ordered by me.  Does she have another primary care physician now (Last visit with me was in 2020)?  SHE APPEARS to have transitioned to type 2 diabetes,  Needs to schedule appt to discuss( NOT A CPE)

## 2020-05-06 ENCOUNTER — Ambulatory Visit: Payer: Medicare Other | Admitting: Psychology

## 2020-06-03 ENCOUNTER — Ambulatory Visit: Payer: Medicare Other | Admitting: Psychology

## 2020-06-09 DIAGNOSIS — E038 Other specified hypothyroidism: Secondary | ICD-10-CM | POA: Diagnosis not present

## 2020-06-18 DIAGNOSIS — R946 Abnormal results of thyroid function studies: Secondary | ICD-10-CM | POA: Diagnosis not present

## 2020-06-19 ENCOUNTER — Other Ambulatory Visit: Payer: Self-pay | Admitting: Internal Medicine

## 2020-06-19 DIAGNOSIS — Z1231 Encounter for screening mammogram for malignant neoplasm of breast: Secondary | ICD-10-CM

## 2020-06-20 ENCOUNTER — Other Ambulatory Visit: Payer: Self-pay | Admitting: Internal Medicine

## 2020-07-01 ENCOUNTER — Ambulatory Visit: Payer: Medicare Other | Admitting: Psychology

## 2020-07-02 ENCOUNTER — Telehealth (INDEPENDENT_AMBULATORY_CARE_PROVIDER_SITE_OTHER): Payer: Medicare Other | Admitting: Internal Medicine

## 2020-07-02 ENCOUNTER — Encounter: Payer: Self-pay | Admitting: Internal Medicine

## 2020-07-02 VITALS — Ht 65.5 in | Wt 274.0 lb

## 2020-07-02 DIAGNOSIS — F3130 Bipolar disorder, current episode depressed, mild or moderate severity, unspecified: Secondary | ICD-10-CM

## 2020-07-02 DIAGNOSIS — K760 Fatty (change of) liver, not elsewhere classified: Secondary | ICD-10-CM | POA: Diagnosis not present

## 2020-07-02 DIAGNOSIS — E034 Atrophy of thyroid (acquired): Secondary | ICD-10-CM

## 2020-07-02 DIAGNOSIS — R0600 Dyspnea, unspecified: Secondary | ICD-10-CM | POA: Diagnosis not present

## 2020-07-02 DIAGNOSIS — R5382 Chronic fatigue, unspecified: Secondary | ICD-10-CM | POA: Diagnosis not present

## 2020-07-02 DIAGNOSIS — G2581 Restless legs syndrome: Secondary | ICD-10-CM | POA: Diagnosis not present

## 2020-07-02 DIAGNOSIS — E538 Deficiency of other specified B group vitamins: Secondary | ICD-10-CM | POA: Diagnosis not present

## 2020-07-02 DIAGNOSIS — U099 Post covid-19 condition, unspecified: Secondary | ICD-10-CM | POA: Diagnosis not present

## 2020-07-02 DIAGNOSIS — E1169 Type 2 diabetes mellitus with other specified complication: Secondary | ICD-10-CM | POA: Diagnosis not present

## 2020-07-02 MED ORDER — ALBUTEROL SULFATE HFA 108 (90 BASE) MCG/ACT IN AERS: 2.0000 | INHALATION_SPRAY | Freq: Four times a day (QID) | RESPIRATORY_TRACT | 2 refills | Status: AC | PRN

## 2020-07-02 MED ORDER — CLONAZEPAM 0.5 MG PO TABS: 0.2500 mg | ORAL_TABLET | Freq: Two times a day (BID) | ORAL | 2 refills | Status: DC

## 2020-07-02 MED ORDER — METFORMIN HCL ER 500 MG PO TB24: 500.0000 mg | ORAL_TABLET | Freq: Every day | ORAL | 1 refills | Status: DC

## 2020-07-02 NOTE — Progress Notes (Signed)
Fasting BS 105

## 2020-07-02 NOTE — Progress Notes (Signed)
Virtual Visit via [facility]  Note  This visit type was conducted due to national recommendations for restrictions regarding the COVID-19 pandemic (e.g. social distancing).  This format is felt to be most appropriate for this patient at this time.  All issues noted in this document were discussed and addressed.  No physical exam was performed (except for noted visual exam findings with Video Visits).   I connected withNAME@ on 07/02/20 at  9:00 AM EDT by a video enabled telemedicine application and verified that I am speaking with the correct person using two identifiers. Location patient: home Location provider: work or home office Persons participating in the virtual visit: patient, provider  I discussed the limitations, risks, security and privacy concerns of performing an evaluation and management service by telephone and the availability of in person appointments. I also discussed with the patient that there may be a patient responsible charge related to this service. The patient expressed understanding and agreed to proceed.  Reason for visit: FOLLOW UP   HPI:  65 yr old female with bipolar disorder, type 2 DM  with obesity/fatty liver/OSA , hypothyroidism presents for follow up on these issues.  She states that she continues to battle Post COVID fatigue syndrome after recovering from an infection suspected to be COVID in Feb 2020, prior to testing availability.  Her fatigue is so profound some days she cannot get out of bed.  Has started painting in  a studio, meeting with a small support group weekly virtually and working with a nutritionist to mange obesity and fatty liver . Has intermittent success staying on a low glycemic index diet but feels better on the diet.  Not losing weight.  Type 2 DM with fatty liver.    RLS:  taking 5 mg requip prescribed by Dr Sharrie Rothman her psychiatrist. [facility] .  She has not seen a neurologist in years but did not work well with  Dr Tat. Discussed referral to Sherryll Burger   Psych:  takes clonazepam 0.25 bid,  )needs refill,  duloxetine 60 mg ,  no longer taking Seoquel  bipolar disorder stable no manic episodes in years  ROS: See pertinent positives and negatives per HPI.  Past Medical History:  Diagnosis Date   Anxiety    Arthritis    Atopic dermatitis    scalp   COVID    2020 and 2021   Depression    Hyperlipidemia    Hypertension    pt denies   Thyroid disease    Tobacco abuse     Past Surgical History:  Procedure Laterality Date   LUMBAR DISC SURGERY     ROTATOR CUFF REPAIR Bilateral     Family History  Problem Relation Age of Onset   Arthritis Mother    Macular degeneration Mother    Heart disease Father    Breast cancer Neg Hx     SOCIAL HX:  reports that she quit smoking about 4 years ago. Her smoking use included cigarettes. She smoked an average of 0.25 packs per day. She has never used smokeless tobacco. She reports current alcohol use. She reports that she does not use drugs.    Current Outpatient Medications:    DULoxetine (CYMBALTA) 60 MG capsule, Take 60 mg by mouth daily. , Disp: , Rfl:    gabapentin (NEURONTIN) 300 MG capsule, Take 1 capsule (300 mg total) by mouth 3 (three) times daily. (Patient taking differently: Take 300 mg by mouth 2 (two) times daily.), Disp:  90 capsule, Rfl: 3   levothyroxine (SYNTHROID) 88 MCG tablet, TAKE 1 TABLET BY MOUTH  DAILY, Disp: 90 tablet, Rfl: 3   melatonin 5 MG TABS, Take 5 mg by mouth at bedtime., Disp: , Rfl:    metFORMIN (GLUCOPHAGE XR) 500 MG 24 hr tablet, Take 1 tablet (500 mg total) by mouth daily with breakfast., Disp: 90 tablet, Rfl: 1   metoprolol succinate (TOPROL-XL) 25 MG 24 hr tablet, TAKE 1 TABLET BY MOUTH  DAILY, Disp: 90 tablet, Rfl: 3   mirabegron ER (MYRBETRIQ) 50 MG TB24 tablet, Take 50 mg by mouth daily., Disp: , Rfl:    ondansetron (ZOFRAN) 4 MG tablet, Take 1 tablet (4 mg total) by mouth every 8 (eight) hours as needed for nausea  or vomiting., Disp: 40 tablet, Rfl: 0   ropinirole (REQUIP) 5 MG tablet, Take 5 mg by mouth as needed. , Disp: , Rfl:    tizanidine (ZANAFLEX) 6 MG capsule, TAKE 1 CAPSULE BY MOUTH THREE TIMES A DAY AS NEEDED FOR MUSCLE SPASMS, Disp: 60 capsule, Rfl: 2   albuterol (VENTOLIN HFA) 108 (90 Base) MCG/ACT inhaler, Inhale 2 puffs into the lungs every 6 (six) hours as needed for wheezing or shortness of breath., Disp: 1 each, Rfl: 2   clonazePAM (KLONOPIN) 0.5 MG tablet, Take 0.5 tablets (0.25 mg total) by mouth 2 (two) times daily., Disp: 30 tablet, Rfl: 2  EXAM:  VITALS per patient if applicable:  GENERAL: alert, oriented, appears well and in no acute distress  HEENT: atraumatic, conjunttiva clear, no obvious abnormalities on inspection of external nose and ears  NECK: normal movements of the head and neck  LUNGS: on inspection no signs of respiratory distress, breathing rate appears normal, no obvious gross SOB, gasping or wheezing  CV: no obvious cyanosis  MS: moves all visible extremities without noticeable abnormality  PSYCH/NEURO: pleasant and cooperative, no obvious depression or anxiety, speech and thought processing grossly intact  ASSESSMENT AND PLAN:  Discussed the following assessment and plan:  Fatty liver disease, nonalcoholic - Plan: Comprehensive metabolic panel  Dyspnea, unspecified type - Plan: albuterol (VENTOLIN HFA) 108 (90 Base) MCG/ACT inhaler  Type 2 diabetes mellitus with other specified complication, without long-term current use of insulin (HCC) - Plan: Lipid panel, Hemoglobin A1c, Microalbumin / creatinine urine ratio, CANCELED: Microalbumin / creatinine urine ratio, CANCELED: Hemoglobin A1c  Restless leg syndrome - Plan: Ambulatory referral to Neurology, Iron, TIBC and Ferritin Panel  B12 deficiency - Plan: Vitamin B12  COVID-19 long hauler manifesting chronic fatigue  Bipolar I disorder, most recent episode depressed (HCC)  Hypothyroidism due to  acquired atrophy of thyroid  COVID-19 long hauler manifesting chronic fatigue Following a suspected infection in Feb 2020.  She reports chronic fatigue that is at times profound.   Bipolar I disorder, most recent episode depressed her symptoms have been quiescent for over a year and she is no longer taking seroquel.  encouraged her to find a psychiatrist to take Dr Merlinda Frederick place.  Medications  will be refilled until then.   Fatty liver disease, nonalcoholic Presumed by ultrasound changes and serologies negative for autoimmune causes of hepatitis.  Current liver enzymes are normal and all modifiable risk factors including obesity, diabetes and hyperlipidemia have been addressed   Lab Results  Component Value Date   ALT 26 04/07/2020   AST 16 04/07/2020   GGT 53 04/07/2020   ALKPHOS 59 04/07/2020   BILITOT 0.3 04/07/2020     Hypothyroidism due to acquired atrophy of  thyroid Thyroid function is normal on current  Dose of levothyroxine 88 mcg .   Lab Results  Component Value Date   TSH 2.90 02/26/2020     Type 2 diabetes mellitus with other specified complication (HCC)  Historically well-controlled on diet alone .  hemoglobin A1c has been consistently at or  less than 7.0 . However she has fatty liver and has been advised to start metformin and a statin.  Starting metformin today.Marland Kitchenoverdue to proteinuria check  Lab Results  Component Value Date   HGBA1C 6.3 02/26/2020   Lab Results  Component Value Date   MICROALBUR 0.3 10/09/2015       I discussed the assessment and treatment plan with the patient. The patient was provided an opportunity to ask questions and all were answered. The patient agreed with the plan and demonstrated an understanding of the instructions.   The patient was advised to call back or seek an in-person evaluation if the symptoms worsen or if the condition fails to improve as anticipated.   I spent 30 minutes dedicated to the care of this patient on  the date of this encounter to include pre-visit review of his medical history,  Face-to-face time with the patient , and post visit ordering of testing and therapeutics.    Sherlene Shams, MD

## 2020-07-02 NOTE — Patient Instructions (Signed)
I recommend Shireen Quan, MD  for psychiatric follow up   I recommend starting metformin for management of type 2 diabetes and fatty liver  A statin is also recommended (atorvastatin,  rosuvastatin) to reduce your elevated risk of heart attack and stroke because of your concurrent diabetes state.  We can discuss this more at your next visit  Referral to Dr Sherryll Burger for RLS in process  Clonazepam refilled

## 2020-07-04 ENCOUNTER — Encounter: Payer: Self-pay | Admitting: Internal Medicine

## 2020-07-04 DIAGNOSIS — U099 Post covid-19 condition, unspecified: Secondary | ICD-10-CM | POA: Insufficient documentation

## 2020-07-04 NOTE — Assessment & Plan Note (Signed)
Historically well-controlled on diet alone .  hemoglobin A1c has been consistently at or  less than 7.0 . However she has fatty liver and has been advised to start metformin and a statin.  Starting metformin today.Marland Kitchenoverdue to proteinuria check  Lab Results  Component Value Date   HGBA1C 6.3 02/26/2020   Lab Results  Component Value Date   MICROALBUR 0.3 10/09/2015

## 2020-07-04 NOTE — Assessment & Plan Note (Signed)
her symptoms have been quiescent for over a year and she is no longer taking seroquel.  encouraged her to find a psychiatrist to take Dr Merlinda Frederick place.  Medications  will be refilled until then.

## 2020-07-04 NOTE — Assessment & Plan Note (Signed)
Following a suspected infection in Feb 2020.  She reports chronic fatigue that is at times profound.

## 2020-07-04 NOTE — Assessment & Plan Note (Signed)
Thyroid function is normal on current  Dose of levothyroxine 88 mcg .   Lab Results  Component Value Date   TSH 2.90 02/26/2020

## 2020-07-04 NOTE — Assessment & Plan Note (Signed)
Presumed by ultrasound changes and serologies negative for autoimmune causes of hepatitis.  Current liver enzymes are normal and all modifiable risk factors including obesity, diabetes and hyperlipidemia have been addressed   Lab Results  Component Value Date   ALT 26 04/07/2020   AST 16 04/07/2020   GGT 53 04/07/2020   ALKPHOS 59 04/07/2020   BILITOT 0.3 04/07/2020

## 2020-07-21 ENCOUNTER — Telehealth: Payer: Medicare Other | Admitting: Physician Assistant

## 2020-07-21 ENCOUNTER — Telehealth: Payer: Self-pay | Admitting: Internal Medicine

## 2020-07-21 DIAGNOSIS — Z20822 Contact with and (suspected) exposure to covid-19: Secondary | ICD-10-CM | POA: Diagnosis not present

## 2020-07-21 DIAGNOSIS — Z03818 Encounter for observation for suspected exposure to other biological agents ruled out: Secondary | ICD-10-CM | POA: Diagnosis not present

## 2020-07-21 DIAGNOSIS — U071 COVID-19: Secondary | ICD-10-CM

## 2020-07-21 MED ORDER — BENZONATATE 100 MG PO CAPS: 100.0000 mg | ORAL_CAPSULE | Freq: Three times a day (TID) | ORAL | 0 refills | Status: DC | PRN

## 2020-07-21 MED ORDER — MOLNUPIRAVIR EUA 200MG CAPSULE: 4.0000 | ORAL_CAPSULE | Freq: Two times a day (BID) | ORAL | 0 refills | Status: AC

## 2020-07-21 NOTE — Progress Notes (Signed)
Virtual Visit Consent   Christy Ehrsam, you are scheduled for a virtual visit with a  provider today.     Just as with appointments in the office, your consent must be obtained to participate.  Your consent will be active for this visit and any virtual visit you may have with one of our providers in the next 365 days.     If you have a MyChart account, a copy of this consent can be sent to you electronically.  All virtual visits are billed to your insurance company just like a traditional visit in the office.    As this is a virtual visit, video technology does not allow for your provider to perform a traditional examination.  This may limit your provider's ability to fully assess your condition.  If your provider identifies any concerns that need to be evaluated in person or the need to arrange testing (such as labs, EKG, etc.), we will make arrangements to do so.     Although advances in technology are sophisticated, we cannot ensure that it will always work on either your end or our end.  If the connection with a video visit is poor, the visit may have to be switched to a telephone visit.  With either a video or telephone visit, we are not always able to ensure that we have a secure connection.     I need to obtain your verbal consent now.   Are you willing to proceed with your visit today?    Brandalynn Ofallon has provided verbal consent on 07/21/2020 for a virtual visit (video or telephone).   Piedad Climes, [place]   Date: 07/21/2020 6:38 PM  Virtual Visit via Video Note   I, Piedad Climes, connected with  Millissa Deese  ([id number], [birth year]) on 07/21/20 at  6:30 PM EDT by a video-enabled telemedicine application and verified that I am speaking with the correct person using two identifiers.  Location: Patient: Virtual Visit Location Patient: Home Provider: Virtual Visit Location Provider: Home Office   I discussed the limitations of evaluation and management by  telemedicine and the availability of in person appointments. The patient expressed understanding and agreed to proceed.    History of Present Illness: Babara Buffalo is a 65 y.o. who identifies as a female who was assigned female at birth, and is being seen today for COVID-19. Patient endorses her symptoms starting this past Sunday (07/19/2020) with sore throat, headache, fatigue, post-nasal drip, dry cough. Endorses low-grade fever (Tmax < 100).   HPI: HPI  Problems:  Patient Active Problem List   Diagnosis Date Noted   COVID-19 long hauler manifesting chronic fatigue 07/04/2020   Prediabetes 04/01/2020   Elevated liver enzymes 04/01/2020   Conjunctivitis 09/19/2018   Suspected COVID-19 virus infection 05/28/2018   PSVT (paroxysmal supraventricular tachycardia) (HCC) 05/11/2017   OSA (obstructive sleep apnea) 10/17/2016   Fatigue 10/03/2016   Pulmonary nodule less than 6 cm determined by computed tomography of lung 01/13/2016   Overactive bladder 12/15/2015   Type 2 diabetes mellitus with other specified complication (HCC) 10/10/2015   Bipolar I disorder, most recent episode depressed (HCC) 06/04/2014   Hypovitaminosis D 12/12/2013   Encounter for preventive health examination 12/09/2013   Constipation 04/22/2013   Lumbago 08/17/2012   History of acute pancreatitis 07/11/2012   Colon polyp, hyperplastic 04/30/2012   B12 deficiency 04/11/2011   Fatty liver disease, nonalcoholic 04/11/2011   Obesity, Class III, BMI 40-49.9 (morbid obesity) (HCC) 04/11/2011   Tobacco  abuse    Hypothyroidism due to acquired atrophy of thyroid    Hyperlipidemia    Arthritis    Atopic dermatitis    Anxiety     Allergies:  Allergies  Allergen Reactions   Ancef [Cefazolin Sodium] Anaphylaxis   Penicillins Anaphylaxis   Clarithromycin Itching   Doxycycline     Nausea/vomiting    Eggs Or Egg-Derived Products Itching   Seroquel [Quetiapine Fumarate] Other (See Comments)    fatigue   Medications:   Current Outpatient Medications:    molnupiravir EUA 200 mg CAPS, Take 4 capsules (800 mg total) by mouth 2 (two) times daily for 5 days., Disp: 40 capsule, Rfl: 0   albuterol (VENTOLIN HFA) 108 (90 Base) MCG/ACT inhaler, Inhale 2 puffs into the lungs every 6 (six) hours as needed for wheezing or shortness of breath., Disp: 1 each, Rfl: 2   clonazePAM (KLONOPIN) 0.5 MG tablet, Take 0.5 tablets (0.25 mg total) by mouth 2 (two) times daily., Disp: 30 tablet, Rfl: 2   DULoxetine (CYMBALTA) 60 MG capsule, Take 60 mg by mouth daily. , Disp: , Rfl:    gabapentin (NEURONTIN) 300 MG capsule, Take 1 capsule (300 mg total) by mouth 3 (three) times daily. (Patient taking differently: Take 300 mg by mouth 2 (two) times daily.), Disp: 90 capsule, Rfl: 3   levothyroxine (SYNTHROID) 88 MCG tablet, TAKE 1 TABLET BY MOUTH  DAILY, Disp: 90 tablet, Rfl: 3   melatonin 5 MG TABS, Take 5 mg by mouth at bedtime., Disp: , Rfl:    metFORMIN (GLUCOPHAGE XR) 500 MG 24 hr tablet, Take 1 tablet (500 mg total) by mouth daily with breakfast., Disp: 90 tablet, Rfl: 1   metoprolol succinate (TOPROL-XL) 25 MG 24 hr tablet, TAKE 1 TABLET BY MOUTH  DAILY, Disp: 90 tablet, Rfl: 3   mirabegron ER (MYRBETRIQ) 50 MG TB24 tablet, Take 50 mg by mouth daily., Disp: , Rfl:    ondansetron (ZOFRAN) 4 MG tablet, Take 1 tablet (4 mg total) by mouth every 8 (eight) hours as needed for nausea or vomiting., Disp: 40 tablet, Rfl: 0   ropinirole (REQUIP) 5 MG tablet, Take 5 mg by mouth as needed. , Disp: , Rfl:    tizanidine (ZANAFLEX) 6 MG capsule, TAKE 1 CAPSULE BY MOUTH THREE TIMES A DAY AS NEEDED FOR MUSCLE SPASMS, Disp: 60 capsule, Rfl: 2  Observations/Objective: Patient is well-developed, well-nourished in no acute distress.  Resting comfortably at home.  Head is normocephalic, atraumatic.  No labored breathing. Speech is clear and coherent with logical content.  Patient is alert and oriented at baseline.   Assessment and Plan: 1.  COVID-19 - molnupiravir EUA 200 mg CAPS; Take 4 capsules (800 mg total) by mouth 2 (two) times daily for 5 days.  Dispense: 40 capsule; Refill: 0 - MyChart COVID-19 home monitoring program; Future Patient higher risk for complications of infection giving body habitus, age, etc. Moderate symptoms at present.  Discussed pros/cons of antiviral therapies, including potential ADRs. She would like to proceed with treatment. Is not candidate for Paxlovid giving no recent Cr/GFR on file. Is candidate for molnupiravir. Rx molnupiravir to take as directed. Supportive measures, OTC medications and Vitamin regimen reviewed. Rx Tessalon. Patient enrolled in COVID monitoring program through MyChart. Quarantine reviewed in detail. Strict ER precautions discussed.  Follow Up Instructions: I discussed the assessment and treatment plan with the patient. The patient was provided an opportunity to ask questions and all were answered. The patient agreed with the plan and  demonstrated an understanding of the instructions.  A copy of instructions were sent to the patient via MyChart.  The patient was advised to call back or seek an in-person evaluation if the symptoms worsen or if the condition fails to improve as anticipated.  Time:  I spent 15 minutes with the patient via telehealth technology discussing the above problems/concerns.    Leeanne Rio, PA-C

## 2020-07-21 NOTE — Telephone Encounter (Signed)
Transfer to Access Nurse   PT spouse called to advise that they tested positive for covid this afternoon and wanted to be seen. Evist did not help as they had issues getting to it and we have no apts available. PT spouse advise she has fatigue, coughing, sore throat, and post nasal drip.

## 2020-07-21 NOTE — Patient Instructions (Signed)
[name], thank you for joining Piedad Climes, PA-C for today's virtual visit.  While this provider is not your primary care provider (PCP), if your PCP is located in our provider database this encounter information will be shared with them immediately following your visit.  Consent: (Patient) [name] provided verbal consent for this virtual visit at the beginning of the encounter.  Current Medications:  Current Outpatient Medications:    albuterol (VENTOLIN HFA) 108 (90 Base) MCG/ACT inhaler, Inhale 2 puffs into the lungs every 6 (six) hours as needed for wheezing or shortness of breath., Disp: 1 each, Rfl: 2   clonazePAM (KLONOPIN) 0.5 MG tablet, Take 0.5 tablets (0.25 mg total) by mouth 2 (two) times daily., Disp: 30 tablet, Rfl: 2   DULoxetine (CYMBALTA) 60 MG capsule, Take 60 mg by mouth daily. , Disp: , Rfl:    gabapentin (NEURONTIN) 300 MG capsule, Take 1 capsule (300 mg total) by mouth 3 (three) times daily. (Patient taking differently: Take 300 mg by mouth 2 (two) times daily.), Disp: 90 capsule, Rfl: 3   levothyroxine (SYNTHROID) 88 MCG tablet, TAKE 1 TABLET BY MOUTH  DAILY, Disp: 90 tablet, Rfl: 3   melatonin 5 MG TABS, Take 5 mg by mouth at bedtime., Disp: , Rfl:    metFORMIN (GLUCOPHAGE XR) 500 MG 24 hr tablet, Take 1 tablet (500 mg total) by mouth daily with breakfast., Disp: 90 tablet, Rfl: 1   metoprolol succinate (TOPROL-XL) 25 MG 24 hr tablet, TAKE 1 TABLET BY MOUTH  DAILY, Disp: 90 tablet, Rfl: 3   mirabegron ER (MYRBETRIQ) 50 MG TB24 tablet, Take 50 mg by mouth daily., Disp: , Rfl:    ondansetron (ZOFRAN) 4 MG tablet, Take 1 tablet (4 mg total) by mouth every 8 (eight) hours as needed for nausea or vomiting., Disp: 40 tablet, Rfl: 0   ropinirole (REQUIP) 5 MG tablet, Take 5 mg by mouth as needed. , Disp: , Rfl:    tizanidine (ZANAFLEX) 6 MG capsule, TAKE 1 CAPSULE BY MOUTH THREE TIMES A DAY AS NEEDED FOR MUSCLE SPASMS, Disp: 60 capsule, Rfl: 2   Medications  ordered in this encounter:  No orders of the defined types were placed in this encounter.    *If you need refills on other medications prior to your next appointment, please contact your pharmacy*  Follow-Up: Call back or seek an in-person evaluation if the symptoms worsen or if the condition fails to improve as anticipated.  Other Instructions Please keep well-hydrated and get plenty of rest. Start a saline nasal rinse to flush out your nasal passages. You can use plain Mucinex to help thin congestion. If you have a humidifier, running in the bedroom at night. I want you to start OTC vitamin D3 1000 units daily, vitamin C 1000 mg daily, and a zinc supplement. Please take prescribed medications as directed.  You have been enrolled in a MyChart symptom monitoring program. Please answer these questions daily so we can keep track of how you are doing.  You were to quarantine for 5 days from onset of your symptoms.  After day 5, if you have had no fever and you are feeling better, you can end quarantine but need to mask for an additional 5 days. After day 5 if you have a fever or are having significant symptoms, please quarantine for full 10 days.  If you note any worsening of symptoms, any significant shortness of breath or any chest pain, please seek ER evaluation ASAP.  Please  do not delay care!   If you have been instructed to have an in-person evaluation today at a local Urgent Care facility, please use the link below. It will take you to a list of all of our available [facility], including address, phone number and hours of operation. Please do not delay care.  [facility]  If you or a family member do not have a primary care provider, use the link below to schedule a visit and establish care. When you choose a [facility] primary care physician or advanced practice provider, you gain a long-term partner in health. Find a Primary Care Provider  Learn more  about [facility]'s in-office and virtual care options: [facility] - Get Care Now

## 2020-07-21 NOTE — Telephone Encounter (Signed)
Called and spoke to [name] and her husband. [name] c/o sore throat, headache, nasal congestion, headache x3days. Pt reports no SOB. [name] states that her husband [name] received anti-viral medication and she requests some as well. Informed patient that she needs to be seen and evaluated to receive medication. Informed her that Dr.Tullo would want her to be seen today and gave them instruction to access Mychart Urgent Care Video Visits. [name] veralized understanding and states that he will set her up a urgent video visit today to be seen and evaluated. Stressed that if [name] begins to feel any worse or develops any SOB they should go to the local urgent care to be seen. [name] verbalized understanding and had no other questions.

## 2020-07-21 NOTE — Telephone Encounter (Signed)
Patients husband called and he has tested positive for COVID and received anti-viral medication. His wife tested positive for COVID and is requesting anti-viral medication for her.Tested positive for COVID today.Patients husband requesting she also gets prednisone 10 mg.

## 2020-07-22 ENCOUNTER — Telehealth: Payer: Self-pay

## 2020-07-22 NOTE — Telephone Encounter (Signed)
Called to check if Patient still needs the appointment scheduled for 09/02/20. Pt was seen the night of 07/21/20 for covid symptoms and received medication, molnupiravir and benzonatate. Pt made the appointment before connecting for a virtual visit.

## 2020-07-22 NOTE — Telephone Encounter (Signed)
Providing Access nurse documentation. Patient was seen at 6:30pm for a virtual urgent care visit and received molnupiravir and tessalon.

## 2020-07-23 ENCOUNTER — Telehealth: Payer: Self-pay

## 2020-07-23 NOTE — Telephone Encounter (Signed)
Patient returned call regarding the mychart questionnaire monitoring. Patient states that she was having some sob yesterday but doesn't have today. Patient states that she would like to have a steroid. Patient advise that she would need to contact her provider to see if they would prescribe for her.

## 2020-07-23 NOTE — Telephone Encounter (Signed)
Left a vm for patient to callback in regarding to mychart questionnaire. Will also send a mychart message with guidelines to follow for symptoms.

## 2020-07-23 NOTE — Telephone Encounter (Signed)
Spoke with pt to let her know that the message has been sent to Dr. Darrick Huntsman but she is out of the office this afternoon and will respond as soon as she is able. Pt gave a verbal understanding

## 2020-07-23 NOTE — Telephone Encounter (Signed)
Pt had a virtual visit on 07/21/2020 and was prescribed medications

## 2020-07-23 NOTE — Telephone Encounter (Signed)
Patients husband called in and wanted to know if Dr.Tullo saw her most recent MyChart message.Please call the patient.

## 2020-07-24 NOTE — Telephone Encounter (Signed)
Patient's husband called about the status of the MyChart message below.

## 2020-07-26 ENCOUNTER — Other Ambulatory Visit: Payer: Self-pay | Admitting: Internal Medicine

## 2020-07-26 MED ORDER — PREDNISONE 10 MG PO TABS: ORAL_TABLET | ORAL | 0 refills | Status: DC

## 2020-07-29 ENCOUNTER — Ambulatory Visit: Payer: Medicare Other | Admitting: Psychology

## 2020-08-19 DIAGNOSIS — H04123 Dry eye syndrome of bilateral lacrimal glands: Secondary | ICD-10-CM | POA: Diagnosis not present

## 2020-08-24 ENCOUNTER — Ambulatory Visit (INDEPENDENT_AMBULATORY_CARE_PROVIDER_SITE_OTHER): Payer: Medicare Other

## 2020-08-24 ENCOUNTER — Telehealth: Payer: Self-pay | Admitting: Internal Medicine

## 2020-08-24 VITALS — Ht 65.5 in | Wt 274.0 lb

## 2020-08-24 DIAGNOSIS — Z0000 Encounter for general adult medical examination without abnormal findings: Secondary | ICD-10-CM

## 2020-08-24 NOTE — Patient Instructions (Signed)
  Ms. Steinmeyer , Thank you for taking time to come for your Medicare Wellness Visit. I appreciate your ongoing commitment to your health goals. Please review the following plan we discussed and let me know if I can assist you in the future.   These are the goals we discussed:  Goals       Patient Stated     Low Carb diet (pt-stated)        This is a list of the screening recommended for you and due dates:  Health Maintenance  Topic Date Due   Complete foot exam   Never done   Urine Protein Check  10/08/2016   Mammogram  11/21/2019   Eye exam for diabetics  06/10/2020   Flu Shot  08/03/2020   Zoster (Shingles) Vaccine (1 of 2) 11/24/2020*   Pneumonia vaccines (1 of 2 - PCV13) 08/24/2021*   Hemoglobin A1C  08/25/2020   Colon Cancer Screening  04/16/2023   Tetanus Vaccine  09/10/2023   DEXA scan (bone density measurement)  Completed   COVID-19 Vaccine  Completed   Hepatitis C Screening: USPSTF Recommendation to screen - Ages 55-79 yo.  Completed   HPV Vaccine  Aged Out  *Topic was postponed. The date shown is not the original due date.

## 2020-08-24 NOTE — Telephone Encounter (Signed)
FYI

## 2020-08-24 NOTE — Progress Notes (Signed)
Subjective:   [name] is a 65 y.o. female who presents for Medicare Annual (Subsequent) preventive examination.  Review of Systems    No ROS.  Medicare Wellness Virtual Visit.  Visual/audio telehealth visit, UTA vital signs.   See social history for additional risk factors.   Cardiac Risk Factors include: advanced age (>34men, >66 women);diabetes mellitus     Objective:    Today's Vitals   08/24/20 1410  Weight: 274 lb (124.3 kg)  Height: 5' 5.5" (1.664 m)   Body mass index is 44.9 kg/m.  Advanced Directives 08/24/2020 08/22/2019 02/14/2016 02/12/2016  Does Patient Have a Medical Advance Directive? No No No No  Would patient like information on creating a medical advance directive? No - Patient declined Yes (MAU/Ambulatory/Procedural Areas - Information given) - -    Current Medications (verified) Outpatient Encounter Medications as of 08/24/2020  Medication Sig   albuterol (VENTOLIN HFA) 108 (90 Base) MCG/ACT inhaler Inhale 2 puffs into the lungs every 6 (six) hours as needed for wheezing or shortness of breath.   clonazePAM (KLONOPIN) 0.5 MG tablet Take 0.5 tablets (0.25 mg total) by mouth 2 (two) times daily.   DULoxetine (CYMBALTA) 60 MG capsule Take 60 mg by mouth daily.    gabapentin (NEURONTIN) 300 MG capsule Take 1 capsule (300 mg total) by mouth 3 (three) times daily. (Patient taking differently: Take 300 mg by mouth 2 (two) times daily.)   levothyroxine (SYNTHROID) 88 MCG tablet TAKE 1 TABLET BY MOUTH  DAILY   melatonin 5 MG TABS Take 5 mg by mouth at bedtime.   metFORMIN (GLUCOPHAGE XR) 500 MG 24 hr tablet Take 1 tablet (500 mg total) by mouth daily with breakfast.   metoprolol succinate (TOPROL-XL) 25 MG 24 hr tablet TAKE 1 TABLET BY MOUTH  DAILY   mirabegron ER (MYRBETRIQ) 50 MG TB24 tablet Take 50 mg by mouth daily.   ondansetron (ZOFRAN) 4 MG tablet Take 1 tablet (4 mg total) by mouth every 8 (eight) hours as needed for nausea or vomiting.   ropinirole  (REQUIP) 5 MG tablet Take 5 mg by mouth as needed.    tizanidine (ZANAFLEX) 6 MG capsule TAKE 1 CAPSULE BY MOUTH THREE TIMES A DAY AS NEEDED FOR MUSCLE SPASMS   [DISCONTINUED] benzonatate (TESSALON) 100 MG capsule Take 1 capsule (100 mg total) by mouth 3 (three) times daily as needed for cough.   [DISCONTINUED] predniSONE (DELTASONE) 10 MG tablet 6 tablets daily for 3 days, then reduce by 1 tablet daily until gone   No facility-administered encounter medications on file as of 08/24/2020.    Allergies (verified) Ancef [cefazolin sodium], Penicillins, Clarithromycin, Doxycycline, Eggs or egg-derived products, and Seroquel [quetiapine fumarate]   History: Past Medical History:  Diagnosis Date   Anxiety    Arthritis    Atopic dermatitis    scalp   COVID    2020 and 2021   Depression    Hyperlipidemia    Hypertension    pt denies   Thyroid disease    Tobacco abuse    Past Surgical History:  Procedure Laterality Date   LUMBAR DISC SURGERY     ROTATOR CUFF REPAIR Bilateral    Family History  Problem Relation Age of Onset   Arthritis Mother    Macular degeneration Mother    Neuropathy Mother    Heart disease Father    Breast cancer Neg Hx    Social History   Socioeconomic History   Marital status: Married  Spouse name: Not on file   Number of children: Not on file   Years of education: Not on file   Highest education level: Not on file  Occupational History   Not on file  Tobacco Use   Smoking status: Former    Packs/day: 0.25    Types: Cigarettes    Quit date: 01/02/2016    Years since quitting: 4.6   Smokeless tobacco: Never  Substance and Sexual Activity   Alcohol use: Yes    Comment: once a week    Drug use: No   Sexual activity: Not on file  Other Topics Concern   Not on file  Social History Narrative   Not on file   Social Determinants of Health   Financial Resource Strain: Low Risk    Difficulty of Paying Living Expenses: Not hard at all  Food  Insecurity: No Food Insecurity   Worried About Programme researcher, broadcasting/film/videounning Out of Food in the Last Year: Never true   Ran Out of Food in the Last Year: Never true  Transportation Needs: No Transportation Needs   Lack of Transportation (Medical): No   Lack of Transportation (Non-Medical): No  Physical Activity: Not on file  Stress: No Stress Concern Present   Feeling of Stress : Not at all  Social Connections: Unknown   Frequency of Communication with Friends and Family: Not on file   Frequency of Social Gatherings with Friends and Family: Not on file   Attends Religious Services: Not on file   Active Member of Clubs or Organizations: Not on file   Attends BankerClub or Organization Meetings: Not on file   Marital Status: Married    Tobacco Counseling Counseling given: Not Answered   Clinical Intake:  Pre-visit preparation completed: Yes       Nutrition Risk Assessment: Has the patient had any N/V/D within the last 2 months?  No  Does the patient have any non-healing wounds?  No  Has the patient had any unintentional weight loss or weight gain?  No   Financial Strains and Diabetes Management: Does the patient want to be seen by Chronic Care Management for management of their diabetes?  No  Would the patient like to be referred to a Nutritionist or for Diabetic Management?  No      Interpreter Needed?: No      Activities of Daily Living In your present state of health, do you have any difficulty performing the following activities: 08/24/2020  Hearing? Y  Comment Hearing aids  Vision? N  Difficulty concentrating or making decisions? N  Walking or climbing stairs? N  Dressing or bathing? N  Doing errands, shopping? Y  Preparing Food and eating ? N  Using the Toilet? N  In the past six months, have you accidently leaked urine? N  Do you have problems with loss of bowel control? N  Managing your Medications? Y  Comment Husband assist  Managing your Finances? Y  Comment Husband assist   Housekeeping or managing your Housekeeping? N  Some recent data might be hidden    Patient Care Team: Sherlene Shamsullo, Teresa L, MD as PCP - General (Internal Medicine)  Indicate any recent Medical Services you may have received from other than [facility] providers in the past year (date may be approximate).     Assessment:   This is a routine wellness examination for MaconLinda.  I connected with [name] today by telephone and verified that I am speaking with the correct person using two identifiers. Location patient:  home Location provider: work Persons participating in the virtual visit: patient, nurse.    I discussed the limitations, risks, security and privacy concerns of performing an evaluation and management service by telephone and the availability of in person appointments. The patient expressed understanding and verbally consented to this telephonic visit.    Interactive audio and video telecommunications were attempted between this provider and patient, however failed, due to patient having technical difficulties OR patient did not have access to video capability.  We continued and completed visit with audio only.  Some vital signs may be absent or patient reported.   Hearing/Vision screen Hearing Screening - Comments:: Hearing aids Vision Screening - Comments:: Wears corrective lenses  They have seen their ophthalmologist in the last 12 months.   Dietary issues and exercise activities discussed: Current Exercise Habits: The patient does not participate in regular exercise at present Low carb diet Good water intake   Goals Addressed               This Visit's Progress     Patient Stated     Low Carb diet (pt-stated)   On track      Depression Screen PHQ 2/9 Scores 08/24/2020 07/02/2020 08/22/2019 05/17/2018 12/14/2015  PHQ - 2 Score - 0 0 0 0  PHQ- 9 Score - 6 - - -  Exception Documentation Other- indicate reason in comment box - - - -  Not completed Followed by psychiatrist  every 3 months - - - -    Fall Risk Fall Risk  08/24/2020 07/02/2020 03/19/2020 08/22/2019 09/18/2018  Falls in the past year? 0 0 0 0 0  Number falls in past yr: - - 0 0 -  Injury with Fall? 0 - 0 - -  Follow up - Falls evaluation completed Falls evaluation completed Falls evaluation completed Falls evaluation completed    FALL RISK PREVENTION PERTAINING TO THE HOME: Adequate lighting in your home to reduce risk of falls? Yes   ASSISTIVE DEVICES UTILIZED TO PREVENT FALLS: Use of a cane, walker or w/c? No   TIMED UP AND GO: Was the test performed? No .   Cognitive Function: Patient is alert and oriented x3.  MMSE - Mini Mental State Exam 09/24/2015  Not completed: Unable to complete     6CIT Screen 08/22/2019  What Year? 0 points  What month? 0 points  What time? 0 points  Months in reverse 0 points    Immunizations Immunization History  Administered Date(s) Administered   Fluad Quad(high Dose 65+) 10/16/2019   Hep A / Hep B 04/11/2011, 05/11/2011, 11/04/2011   Influenza-Unspecified 10/09/2015, 11/04/2017   PFIZER(Purple Top)SARS-COV-2 Vaccination 02/15/2019, 03/31/2019, 10/16/2019, 06/18/2020   PNEUMOCOCCAL CONJUGATE-20 06/18/2020   Pneumococcal Polysaccharide-23 08/27/2015   Tdap 09/09/2013   Shingles vaccine- Due, Education has been provided regarding the importance of this vaccine. Advised may receive this vaccine at local pharmacy or Health Dept. Aware to provide a copy of the vaccination record if obtained from local pharmacy or Health Dept. Verbalized acceptance and understanding. Deferred.   PNA vaccine- Due, Education has been provided regarding the importance of this vaccine. Advised may receive this vaccine in office, at local pharmacy or Health Dept. Aware to provide a copy of the vaccination record if obtained from local pharmacy or Health Dept. Verbalized acceptance and understanding. Deferred.   Foot exam- due. Notes no change. Followed by pcp.  Eye exam-  notes annual appointment scheduled next week. Agrees to update chart once completed.  Health Maintenance Health Maintenance  Topic Date Due   FOOT EXAM  Never done   URINE MICROALBUMIN  10/08/2016   MAMMOGRAM  11/21/2019   OPHTHALMOLOGY EXAM  06/10/2020   INFLUENZA VACCINE  08/03/2020   Zoster Vaccines- Shingrix (1 of 2) 11/24/2020 (Originally 03/10/2003)   PNA vac Low Risk Adult (1 of 2 - PCV13) 08/24/2021 (Originally 03/10/2018)   HEMOGLOBIN A1C  08/25/2020   COLONOSCOPY (Pts 45-75yrs Insurance coverage will need to be confirmed)  04/16/2023   TETANUS/TDAP  09/10/2023   DEXA SCAN  Completed   COVID-19 Vaccine  Completed   Hepatitis C Screening  Completed   HPV VACCINES  Aged Out   Mammogram- plans to schedule  Vision Screening: Recommended annual ophthalmology exams for early detection of glaucoma and other disorders of the eye.  Dental Screening: Recommended annual dental exams for proper oral hygiene  Community Resource Referral / Chronic Care Management: CRR required this visit?  No   CCM required this visit?  No      Plan:     I have personally reviewed and noted the following in the patient's chart:   Medical and social history Use of alcohol, tobacco or illicit drugs  Current medications and supplements including opioid prescriptions. Not taking opioid.  Functional ability and status Nutritional status Physical activity Advanced directives List of other physicians Hospitalizations, surgeries, and ER visits in previous 12 months Vitals Screenings to include cognitive, depression, and falls Referrals and appointments  In addition, I have reviewed and discussed with patient certain preventive protocols, quality metrics, and best practice recommendations. A written personalized care plan for preventive services as well as general preventive health recommendations were provided to patient via mychart.     Ashok Pall, LPN   [id number]

## 2020-08-24 NOTE — Telephone Encounter (Signed)
Patient's husband calling in , states Patient tested positive for COVID this morning. States the Patient is doing okay just fatigued.   Patient's husband wanting to verify that her 2:00 wellness visit is virtual. Confirmed this for him. States wife will answer the phone for her visit unless she is sleeping.   Declines the need for medication or appointment at this time. States that if her symptoms progress he will take her to be seen urgently. Informed him that there is a 5 day window on Covid medication. He verbalized understanding.

## 2020-09-02 ENCOUNTER — Encounter: Payer: Self-pay | Admitting: Internal Medicine

## 2020-09-02 ENCOUNTER — Telehealth (INDEPENDENT_AMBULATORY_CARE_PROVIDER_SITE_OTHER): Payer: Medicare Other | Admitting: Internal Medicine

## 2020-09-02 VITALS — Ht 65.0 in | Wt 250.0 lb

## 2020-09-02 DIAGNOSIS — U099 Post covid-19 condition, unspecified: Secondary | ICD-10-CM

## 2020-09-02 DIAGNOSIS — I499 Cardiac arrhythmia, unspecified: Secondary | ICD-10-CM

## 2020-09-02 DIAGNOSIS — G933 Postviral fatigue syndrome: Secondary | ICD-10-CM | POA: Diagnosis not present

## 2020-09-02 DIAGNOSIS — G4733 Obstructive sleep apnea (adult) (pediatric): Secondary | ICD-10-CM

## 2020-09-02 DIAGNOSIS — G9331 Postviral fatigue syndrome: Secondary | ICD-10-CM

## 2020-09-02 DIAGNOSIS — R5382 Chronic fatigue, unspecified: Secondary | ICD-10-CM | POA: Diagnosis not present

## 2020-09-02 DIAGNOSIS — F3130 Bipolar disorder, current episode depressed, mild or moderate severity, unspecified: Secondary | ICD-10-CM

## 2020-09-02 MED ORDER — BENZONATATE 200 MG PO CAPS: 200.0000 mg | ORAL_CAPSULE | Freq: Three times a day (TID) | ORAL | 1 refills | Status: DC | PRN

## 2020-09-02 NOTE — Progress Notes (Signed)
Virtual Visit via caregility  Note  This visit type was conducted due to national recommendations for restrictions regarding the COVID-19 pandemic (e.g. social distancing).  This format is felt to be most appropriate for this patient at this time.  All issues noted in this document were discussed and addressed.  No physical exam was performed (except for noted visual exam findings with Video Visits).   I connected withNAME@ on 09/02/20 at  4:30 PM EDT by a video enabled telemedicine application and verified that I am speaking with the correct person using two identifiers. Location patient: home Location provider: work or home office Persons participating in the virtual visit: patient, provider and patient's husband   I discussed the limitations, risks, security and privacy concerns of performing an evaluation and management service by telephone and the availability of in person appointments. I also discussed with the patient that there may be a patient responsible charge related to this service. The patient expressed understanding and agreed to proceed.  Reason for visit: post COVID fatigue  HPI:  65 yr old female with bipolar disorder, managed by [employer],  obesity,  OSA and type 2 DM.  Recovering from recent COVID infection .(Her second, diagnosed on July 19 at [facility] with PCR).  She was treated on July 19  with molnupiravir  for moderate symptoms.  She continues to have an intermittent  dry cough and is concerned about her  potenctial "long hauler " symptoms because she has been experiencing profound fatigue, aggravated by activity,  and decreased concentration .   Bipolar disorder:  symptoms have been very well controlled for over a year,  but her current psychiatrist is soon leaving the area or retiring and has not recommended or referred her to a colleague.    ROS: See pertinent positives and negatives per HPI.  Past Medical History:  Diagnosis Date   Anxiety     Arthritis    Atopic dermatitis    scalp   COVID    2020 and 2021   Depression    Hyperlipidemia    Hypertension    pt denies   Thyroid disease    Tobacco abuse     Past Surgical History:  Procedure Laterality Date   LUMBAR DISC SURGERY     ROTATOR CUFF REPAIR Bilateral     Family History  Problem Relation Age of Onset   Arthritis Mother    Macular degeneration Mother    Neuropathy Mother    Heart disease Father    Breast cancer Neg Hx     SOCIAL HX:  reports that she quit smoking about 4 years ago. Her smoking use included cigarettes. She smoked an average of .25 packs per day. She has never used smokeless tobacco. She reports current alcohol use. She reports that she does not use drugs.    Current Outpatient Medications:    albuterol (VENTOLIN HFA) 108 (90 Base) MCG/ACT inhaler, Inhale 2 puffs into the lungs every 6 (six) hours as needed for wheezing or shortness of breath., Disp: 1 each, Rfl: 2   benzonatate (TESSALON) 200 MG capsule, Take 1 capsule (200 mg total) by mouth 3 (three) times daily as needed for cough., Disp: 60 capsule, Rfl: 1   clonazePAM (KLONOPIN) 0.5 MG tablet, Take 0.5 tablets (0.25 mg total) by mouth 2 (two) times daily., Disp: 30 tablet, Rfl: 2   DULoxetine (CYMBALTA) 60 MG capsule, Take 60 mg by mouth daily. , Disp: , Rfl:    gabapentin (NEURONTIN) 300  MG capsule, Take 1 capsule (300 mg total) by mouth 3 (three) times daily. (Patient taking differently: Take 300 mg by mouth 2 (two) times daily.), Disp: 90 capsule, Rfl: 3   levothyroxine (SYNTHROID) 88 MCG tablet, TAKE 1 TABLET BY MOUTH  DAILY, Disp: 90 tablet, Rfl: 3   melatonin 5 MG TABS, Take 5 mg by mouth at bedtime., Disp: , Rfl:    metFORMIN (GLUCOPHAGE XR) 500 MG 24 hr tablet, Take 1 tablet (500 mg total) by mouth daily with breakfast., Disp: 90 tablet, Rfl: 1   metoprolol succinate (TOPROL-XL) 25 MG 24 hr tablet, TAKE 1 TABLET BY MOUTH  DAILY, Disp: 90 tablet, Rfl: 3   mirabegron ER (MYRBETRIQ)  50 MG TB24 tablet, Take 50 mg by mouth daily., Disp: , Rfl:    ondansetron (ZOFRAN) 4 MG tablet, Take 1 tablet (4 mg total) by mouth every 8 (eight) hours as needed for nausea or vomiting., Disp: 40 tablet, Rfl: 0   ropinirole (REQUIP) 5 MG tablet, Take 5 mg by mouth as needed. , Disp: , Rfl:    tizanidine (ZANAFLEX) 6 MG capsule, TAKE 1 CAPSULE BY MOUTH THREE TIMES A DAY AS NEEDED FOR MUSCLE SPASMS, Disp: 60 capsule, Rfl: 2  EXAM:  VITALS per patient if applicable:  GENERAL: alert, oriented, appears well and in no acute distress  HEENT: atraumatic, conjunttiva clear, no obvious abnormalities on inspection of external nose and ears  NECK: normal movements of the head and neck  LUNGS: on inspection no signs of respiratory distress, breathing rate appears normal, no obvious gross SOB, gasping or wheezing  CV: no obvious cyanosis  MS: moves all visible extremities without noticeable abnormality  PSYCH/NEURO: pleasant and cooperative, no obvious depression or anxiety, speech and thought processing grossly intact  ASSESSMENT AND PLAN:  Discussed the following assessment and plan:  Cardiac arrhythmia, unspecified cardiac arrhythmia type - Plan: Ambulatory referral to Cardiology  OSA (obstructive sleep apnea) - Plan: Ambulatory referral to Sleep Studies  Postviral fatigue syndrome  Bipolar I disorder, most recent episode depressed (HCC)  COVID-19 long hauler manifesting chronic fatigue  Fatigue Chronic, aggravated by COVID infection.  Discussed referral to [facility] multidisciplinary rehab program once she has been evaluated by cardiology and had a repeat sleep study   Bipolar I disorder, most recent episode depressed her symptoms have been quiescent for over a year and she is no longer taking seroquel.  encouraged her to find a psychiatrist to take Dr Merlinda Frederick place.  Medications  will be refilled until then.   COVID-19 long hauler manifesting chronic fatigue She has hd a suspected  infection oin Feb 2020 and a confirmed infection July 2022,  She continues to report profound fatigue and decreased concentration but has multiple comorbidities that may be contributing .    I discussed the assessment and treatment plan with the patient. The patient was provided an opportunity to ask questions and all were answered. The patient agreed with the plan and demonstrated an understanding of the instructions.   The patient was advised to call back or seek an in-person evaluation if the symptoms worsen or if the condition fails to improve as anticipated.   I spent 30 minutes dedicated to the care of this patient on the date of this encounter to include pre-visit review of her medical history,  Face-to-face time with the patient , and post visit ordering of testing and therapeutics.    Sherlene Shams, MD

## 2020-09-05 NOTE — Assessment & Plan Note (Signed)
She has hd a suspected infection oin Feb 2020 and a confirmed infection July 2022,  She continues to report profound fatigue and decreased concentration but has multiple comorbidities that may be contributing .

## 2020-09-05 NOTE — Assessment & Plan Note (Signed)
her symptoms have been quiescent for over a year and she is no longer taking seroquel.  encouraged her to find a psychiatrist to take Dr Monroe's place.  Medications  will be refilled until then.  

## 2020-09-05 NOTE — Assessment & Plan Note (Signed)
Chronic, aggravated by COVID infection.  Discussed referral to [facility] multidisciplinary rehab program once she has been evaluated by cardiology and had a repeat sleep study

## 2020-09-09 DIAGNOSIS — R946 Abnormal results of thyroid function studies: Secondary | ICD-10-CM | POA: Diagnosis not present

## 2020-09-09 DIAGNOSIS — G2581 Restless legs syndrome: Secondary | ICD-10-CM | POA: Diagnosis not present

## 2020-09-09 DIAGNOSIS — G9332 Myalgic encephalomyelitis/chronic fatigue syndrome: Secondary | ICD-10-CM

## 2020-09-09 DIAGNOSIS — U099 Post covid-19 condition, unspecified: Secondary | ICD-10-CM

## 2020-09-09 DIAGNOSIS — Z8739 Personal history of other diseases of the musculoskeletal system and connective tissue: Secondary | ICD-10-CM | POA: Diagnosis not present

## 2020-09-09 DIAGNOSIS — R9401 Abnormal electroencephalogram [EEG]: Secondary | ICD-10-CM | POA: Diagnosis not present

## 2020-09-09 DIAGNOSIS — E538 Deficiency of other specified B group vitamins: Secondary | ICD-10-CM | POA: Diagnosis not present

## 2020-09-10 ENCOUNTER — Other Ambulatory Visit: Payer: Self-pay | Admitting: Neurology

## 2020-09-10 DIAGNOSIS — R9401 Abnormal electroencephalogram [EEG]: Secondary | ICD-10-CM

## 2020-09-16 ENCOUNTER — Other Ambulatory Visit: Payer: Self-pay | Admitting: Internal Medicine

## 2020-09-18 NOTE — Telephone Encounter (Signed)
Thank you referral received and sent.

## 2020-09-19 ENCOUNTER — Ambulatory Visit
Admission: RE | Admit: 2020-09-19 | Discharge: 2020-09-19 | Disposition: A | Discharge status: Home / Self Care | Payer: Medicare Other | Source: Ambulatory Visit | Attending: Neurology | Admitting: Neurology

## 2020-09-19 ENCOUNTER — Other Ambulatory Visit: Payer: Self-pay

## 2020-09-19 DIAGNOSIS — R9401 Abnormal electroencephalogram [EEG]: Secondary | ICD-10-CM | POA: Insufficient documentation

## 2020-09-19 DIAGNOSIS — R9431 Abnormal electrocardiogram [ECG] [EKG]: Secondary | ICD-10-CM | POA: Diagnosis not present

## 2020-09-19 DIAGNOSIS — G2581 Restless legs syndrome: Secondary | ICD-10-CM | POA: Diagnosis not present

## 2020-09-30 NOTE — Telephone Encounter (Signed)
Patients husband dropped off a paper copy of the results below.It is upfront in Dr.Tullo's color folder.

## 2020-10-01 NOTE — Telephone Encounter (Signed)
Placed in yellow results folder.  °

## 2020-10-06 ENCOUNTER — Telehealth: Payer: Self-pay | Admitting: Internal Medicine

## 2020-10-24 DIAGNOSIS — R9401 Abnormal electroencephalogram [EEG]: Secondary | ICD-10-CM | POA: Diagnosis not present

## 2020-10-24 DIAGNOSIS — R258 Other abnormal involuntary movements: Secondary | ICD-10-CM | POA: Diagnosis not present

## 2020-11-11 DIAGNOSIS — R9401 Abnormal electroencephalogram [EEG]: Secondary | ICD-10-CM | POA: Diagnosis not present

## 2020-11-11 DIAGNOSIS — G2581 Restless legs syndrome: Secondary | ICD-10-CM | POA: Diagnosis not present

## 2020-11-11 DIAGNOSIS — R42 Dizziness and giddiness: Secondary | ICD-10-CM | POA: Diagnosis not present

## 2020-11-13 ENCOUNTER — Other Ambulatory Visit: Payer: Self-pay | Admitting: Internal Medicine

## 2020-11-13 NOTE — Telephone Encounter (Signed)
RX Refill: klonopin Last Seen: 08-24-20 Last Ordered: 07-02-20 Next Appt: na

## 2020-11-15 ENCOUNTER — Other Ambulatory Visit: Payer: Self-pay | Admitting: Internal Medicine

## 2020-12-20 ENCOUNTER — Other Ambulatory Visit: Payer: Self-pay | Admitting: Internal Medicine

## 2021-05-25 ENCOUNTER — Other Ambulatory Visit: Payer: Self-pay | Admitting: Internal Medicine

## 2021-05-25 NOTE — Telephone Encounter (Signed)
LMTCB. Need to schedule pt for a follow up appt.  

## 2021-05-27 ENCOUNTER — Other Ambulatory Visit: Payer: Self-pay | Admitting: Internal Medicine

## 2021-05-27 NOTE — Telephone Encounter (Signed)
LMTCB. Pt is past due for an appt. Please schedule when pt calls back.

## 2021-05-27 NOTE — Telephone Encounter (Signed)
LMTCB

## 2021-06-10 LAB — HM DIABETES EYE EXAM

## 2021-08-02 LAB — HM DIABETES EYE EXAM

## 2021-08-19 ENCOUNTER — Telehealth: Payer: Self-pay | Admitting: Internal Medicine

## 2021-08-19 NOTE — Telephone Encounter (Signed)
Copied from CRM [id number]. Topic: Medicare AWV >> Aug 19, 2021 11:19 AM Payton Doughty wrote: Reason for CRM: Left message for patient to schedule Annual Wellness Visit.  Please schedule with Nurse Health Advisor Denisa O'Brien-Blaney, LPN at [facility]. This appt can be telephone or office visit.  Please call [phone] ask for [facility]

## 2021-08-20 ENCOUNTER — Other Ambulatory Visit: Payer: Self-pay | Admitting: Internal Medicine

## 2021-09-07 ENCOUNTER — Ambulatory Visit (INDEPENDENT_AMBULATORY_CARE_PROVIDER_SITE_OTHER): Payer: Medicare Other | Admitting: Internal Medicine

## 2021-09-07 ENCOUNTER — Encounter: Payer: Self-pay | Admitting: Internal Medicine

## 2021-09-07 VITALS — BP 130/88 | HR 92 | Temp 97.8°F | Ht 65.0 in | Wt 266.2 lb

## 2021-09-07 DIAGNOSIS — Z8371 Family history of colonic polyps: Secondary | ICD-10-CM

## 2021-09-07 DIAGNOSIS — G9331 Postviral fatigue syndrome: Secondary | ICD-10-CM | POA: Diagnosis not present

## 2021-09-07 DIAGNOSIS — E7800 Pure hypercholesterolemia, unspecified: Secondary | ICD-10-CM

## 2021-09-07 DIAGNOSIS — E1169 Type 2 diabetes mellitus with other specified complication: Secondary | ICD-10-CM | POA: Diagnosis not present

## 2021-09-07 DIAGNOSIS — E034 Atrophy of thyroid (acquired): Secondary | ICD-10-CM

## 2021-09-07 DIAGNOSIS — Z1231 Encounter for screening mammogram for malignant neoplasm of breast: Secondary | ICD-10-CM

## 2021-09-07 DIAGNOSIS — R21 Rash and other nonspecific skin eruption: Secondary | ICD-10-CM

## 2021-09-07 DIAGNOSIS — Z1211 Encounter for screening for malignant neoplasm of colon: Secondary | ICD-10-CM

## 2021-09-07 LAB — LIPID PANEL
Cholesterol: 203 mg/dL — ABNORMAL HIGH (ref 0–200)
HDL: 70.7 mg/dL (ref >39.00)
LDL Cholesterol: 112 mg/dL — ABNORMAL HIGH (ref 0–99)
NonHDL: 131.84
Total CHOL/HDL Ratio: 3
Triglycerides: 101 mg/dL (ref 0.0–149.0)
VLDL: 20.2 mg/dL (ref 0.0–40.0)

## 2021-09-07 LAB — COMPREHENSIVE METABOLIC PANEL
ALT: 24 U/L (ref 0–35)
AST: 14 U/L (ref 0–37)
Albumin: 4.1 g/dL (ref 3.5–5.2)
Alkaline Phosphatase: 45 U/L (ref 39–117)
BUN: 23 mg/dL (ref 6–23)
CO2: 29 mEq/L (ref 19–32)
Calcium: 9.9 mg/dL (ref 8.4–10.5)
Chloride: 101 mEq/L (ref 96–112)
Creatinine, Ser: 1 mg/dL (ref 0.40–1.20)
GFR: 57.89 mL/min — ABNORMAL LOW (ref >60.00)
Glucose, Bld: 99 mg/dL (ref 70–99)
Potassium: 4.6 mEq/L (ref 3.5–5.1)
Sodium: 140 mEq/L (ref 135–145)
Total Bilirubin: 0.5 mg/dL (ref 0.2–1.2)
Total Protein: 6.8 g/dL (ref 6.0–8.3)

## 2021-09-07 LAB — CBC WITH DIFFERENTIAL/PLATELET
Basophils Absolute: 0.1 10*3/uL (ref 0.0–0.1)
Basophils Relative: 0.9 % (ref 0.0–3.0)
Eosinophils Absolute: 0.1 10*3/uL (ref 0.0–0.7)
Eosinophils Relative: 0.9 % (ref 0.0–5.0)
HCT: 43.8 % (ref 36.0–46.0)
Hemoglobin: 14.2 g/dL (ref 12.0–15.0)
Lymphocytes Relative: 36.5 % (ref 12.0–46.0)
Lymphs Abs: 4.1 10*3/uL — ABNORMAL HIGH (ref 0.7–4.0)
MCHC: 32.3 g/dL (ref 30.0–36.0)
MCV: 92.9 fl (ref 78.0–100.0)
Monocytes Absolute: 0.8 10*3/uL (ref 0.1–1.0)
Monocytes Relative: 7.5 % (ref 3.0–12.0)
Neutro Abs: 6.1 10*3/uL (ref 1.4–7.7)
Neutrophils Relative %: 54.2 % (ref 43.0–77.0)
Platelets: 289 10*3/uL (ref 150.0–400.0)
RBC: 4.71 Mil/uL (ref 3.87–5.11)
RDW: 15 % (ref 11.5–15.5)
WBC: 11.3 10*3/uL — ABNORMAL HIGH (ref 4.0–10.5)

## 2021-09-07 LAB — TSH: TSH: 6.71 u[IU]/mL — ABNORMAL HIGH (ref 0.35–5.50)

## 2021-09-07 LAB — MICROALBUMIN / CREATININE URINE RATIO
Creatinine,U: 135.3 mg/dL
Microalb Creat Ratio: 0.7 mg/g (ref 0.0–30.0)
Microalb, Ur: 0.9 mg/dL (ref 0.0–1.9)

## 2021-09-07 LAB — LDL CHOLESTEROL, DIRECT: Direct LDL: 120 mg/dL

## 2021-09-07 LAB — POCT GLYCOSYLATED HEMOGLOBIN (HGB A1C): Hemoglobin A1C: 6.8 % — AB (ref 4.0–5.6)

## 2021-09-07 LAB — HM DIABETES FOOT EXAM: HM Diabetic Foot Exam: NORMAL

## 2021-09-07 MED ORDER — DOXYCYCLINE HYCLATE 100 MG PO TABS: 100.0000 mg | ORAL_TABLET | Freq: Two times a day (BID) | ORAL | 0 refills | Status: DC

## 2021-09-07 MED ORDER — ZOSTER VAC RECOMB ADJUVANTED 50 MCG/0.5ML IM SUSR: 0.5000 mL | Freq: Once | INTRAMUSCULAR | 1 refills | Status: AC

## 2021-09-07 MED ORDER — PREDNISONE 10 MG PO TABS: ORAL_TABLET | ORAL | 0 refills | Status: DC

## 2021-09-07 NOTE — Assessment & Plan Note (Signed)
Seems to have started in May after having her brows threaded,  May have become infected due to pruritic component and itching,  But also has a rash at nape of neck at hairline, which appears to resolve and leave hypopigmented skin.  predniosne and doxycyline for the facial rash prescribed  Today,.  Urgent referral to [facility] dermatology

## 2021-09-07 NOTE — Assessment & Plan Note (Signed)
She has been lost to follow up for over a year.  Labs will be drawn today and have her follow up.  Foot exam  Done  Eye exam up to date

## 2021-09-07 NOTE — Progress Notes (Addendum)
Subjective:  Patient ID: [name], female    DOB: [birth year]  Age: 66 y.o. MRN: [id number]  CC: The primary encounter diagnosis was Hypothyroidism due to acquired atrophy of thyroid. Diagnoses of Type 2 diabetes mellitus with other specified complication, without long-term current use of insulin ([place]), Pure hypercholesterolemia, Postviral fatigue syndrome, Encounter for screening mammogram for malignant neoplasm of breast, Rash, Colon cancer screening, Facial rash, and Encounter for colonoscopy in patient with family history of colon polyps were also pertinent to this visit.   HPI Jalen Oberry presents for  Chief Complaint  Patient presents with   Follow-up    Follow up from [place] [address]  for cellulitis with doxycycline  .  History per [facility] note:  "Rash on face starting the end of June.  Started with patches on chin and forehead and has since spread to  area around eyes .  Patient states she's having clear drainage from left eye Patient states the rash is itchy.   Has tried cold compress with benefit.   Has also tried eye drops.  Went to [facility] (7/28) and was prescribed antibiotics to which she had a reaction to (caused pain/burning) Was prescribed sulfamethoxazole-trimethoprim and patient said she had a bad reaction  Per patient the history is different:  She feels the rash started in May  after having her eyebrows threaded . Developed  a rash on forehead in the  area between and above the eye brows ,  and on the chin.   Spread to the eyebrows,  peeling skin on the eyelids,  and left eye conjunctivitis and redness and swelling of both eyelids.  The skin became itchy,  burning and painful.  Tried using a product with manuka honey (from [place]) around her eye.    The rash has improved after the doxycycline . However, she has felt poorly for the last 2 weeks.  Covid vaccination :  had an adverse reaction to the last booster .  Tasted metallic  ,  then flu like symptoms     T2DM:  She  feels generally unwell,  is not  exercising regularly or trying to lose weight. Checking  blood sugars less than once daily at variable times, usually only if she feels she may be having a hypoglycemic event. .  BS have been under 130 fasting and < 150 post prandially.  Denies any recent hypoglyemic events.  Taking   medications as directed. Following a carbohydrate modified diet 6 days per week. Denies numbness, burning and tingling of extremities. Appetite is good.    Outpatient Medications Prior to Visit  Medication Sig Dispense Refill   albuterol (VENTOLIN HFA) 108 (90 Base) MCG/ACT inhaler Inhale 2 puffs into the lungs every 6 (six) hours as needed for wheezing or shortness of breath. 1 each 2   amphetamine-dextroamphetamine (ADDERALL) 20 MG tablet Take 20 mg by mouth daily.     benzonatate (TESSALON) 200 MG capsule TAKE 1 CAPSULE (200 MG TOTAL) BY MOUTH 3 (THREE) TIMES DAILY AS NEEDED FOR COUGH. 60 capsule 1   clonazePAM (KLONOPIN) 0.5 MG tablet TAKE 1/2 OF A TABLET (0.25 MG TOTAL) BY MOUTH TWICE A DAY 30 tablet 2   DULoxetine (CYMBALTA) 60 MG capsule Take 60 mg by mouth daily.      gabapentin (NEURONTIN) 300 MG capsule Take 1 capsule (300 mg total) by mouth 3 (three) times daily. (Patient taking differently: Take 300 mg by mouth 2 (  two) times daily.) 90 capsule 3   gabapentin (NEURONTIN) 600 MG tablet Take 600 mg by mouth 2 (two) times daily.     levothyroxine (SYNTHROID) 88 MCG tablet TAKE 1 TABLET BY MOUTH  DAILY 90 tablet 3   liothyronine (CYTOMEL) 25 MCG tablet Take 25 mcg by mouth daily. Takes 1/2 tablet BID     melatonin 5 MG TABS Take 5 mg by mouth at bedtime.     metFORMIN (GLUCOPHAGE-XR) 500 MG 24 hr tablet TAKE 1 TABLET BY MOUTH EVERY DAY WITH BREAKFAST 90 tablet 1   metoprolol succinate (TOPROL-XL) 25 MG 24 hr tablet TAKE 1 TABLET BY MOUTH  DAILY 90 tablet 3   mirabegron ER (MYRBETRIQ) 50 MG TB24 tablet Take 50 mg by mouth daily.      mupirocin ointment (BACTROBAN) 2 % Apply topically 3 (three) times daily.     ondansetron (ZOFRAN) 4 MG tablet Take 1 tablet (4 mg total) by mouth every 8 (eight) hours as needed for nausea or vomiting. 40 tablet 0   QUEtiapine (SEROQUEL) 300 MG tablet Take 300 mg by mouth at bedtime.     ropinirole (REQUIP) 5 MG tablet Take 5 mg by mouth as needed.      tizanidine (ZANAFLEX) 6 MG capsule TAKE 1 CAPSULE BY MOUTH THREE TIMES A DAY AS NEEDED FOR MUSCLE SPASMS 60 capsule 2   levothyroxine (SYNTHROID) 75 MCG tablet Take 75 mcg by mouth every morning.     No facility-administered medications prior to visit.    Review of Systems;  Patient denies headache, fevers, malaise, unintentional weight loss,, sinus congestion and sinus pain, sore throat, dysphagia,  hemoptysis , cough, dyspnea, wheezing, chest pain, palpitations, orthopnea, edema, abdominal pain, nausea, melena, diarrhea, constipation, flank pain, dysuria, hematuria, urinary  Frequency, nocturia, numbness, tingling, seizures,  Focal weakness, Loss of consciousness,  Tremor, insomnia, depression, anxiety, and suicidal ideation.      Objective:  BP 130/88 (BP Location: Left Arm, Patient Position: Sitting, Cuff Size: Large)   Pulse 92   Temp 97.8 F (36.6 C) (Oral)   Ht 5' 5"  (1.651 m)   Wt 266 lb 3.2 oz (120.7 kg)   SpO2 96%   BMI 44.30 kg/m   BP Readings from Last 3 Encounters:  09/07/21 130/88  03/29/19 128/74  05/09/17 126/78    Wt Readings from Last 3 Encounters:  09/07/21 266 lb 3.2 oz (120.7 kg)  09/02/20 250 lb (113.4 kg)  08/24/20 274 lb (124.3 kg)    General appearance: alert, cooperative and appears stated age Ears: normal TM's and external ear canals both ears Face:  both eyebrows with excoriations,  erythematous macular rash Eyes: left injected conunctiva,  lower eyelid swollen and red.  Throat: lips, mucosa, and tongue normal; teeth and gums normal Neck: no adenopathy, no carotid bruit, supple, symmetrical,  trachea midline and thyroid not enlarged, symmetric, no tenderness/mass/nodules Scalp:  several maculopapular nodules at base of hair line ,  adjacent to several hypopigmented macules.  Back: symmetric, no curvature. ROM normal. No CVA tenderness. Lungs: clear to auscultation bilaterally Heart: regular rate and rhythm, S1, S2 normal, no murmur, click, rub or gallop Abdomen: soft, non-tender; bowel sounds normal; no masses,  no organomegaly Pulses: 2+ and symmetric Skin: Skin color, texture, turgor normal. No rashes or lesions Lymph nodes: Cervical, supraclavicular, and axillary nodes normal.  Lab Results  Component Value Date   HGBA1C 6.8 (A) 09/07/2021   HGBA1C 6.3 02/26/2020   HGBA1C 6.1 10/05/2018    Lab  Results  Component Value Date   CREATININE 1.00 09/07/2021   CREATININE 0.99 04/07/2020   CREATININE 1.0 02/26/2020    Lab Results  Component Value Date   WBC 11.3 (H) 09/07/2021   HGB 14.2 09/07/2021   HCT 43.8 09/07/2021   PLT 289.0 09/07/2021   GLUCOSE 99 09/07/2021   CHOL 203 (H) 09/07/2021   TRIG 101.0 09/07/2021   HDL 70.70 09/07/2021   LDLDIRECT 120.0 09/07/2021   LDLCALC 112 (H) 09/07/2021   ALT 24 09/07/2021   AST 14 09/07/2021   NA 140 09/07/2021   K 4.6 09/07/2021   CL 101 09/07/2021   CREATININE 1.00 09/07/2021   BUN 23 09/07/2021   CO2 29 09/07/2021   TSH 6.71 (H) 09/07/2021   HGBA1C 6.8 (A) 09/07/2021   MICROALBUR 0.9 09/07/2021      Assessment & Plan:   Problem List Items Addressed This Visit     Facial rash    Seems to have started in May after having her brows threaded,  May have become infected due to pruritic component and itching,  But also has a rash at nape of neck at hairline, which appears to resolve and leave hypopigmented skin.  predniosne and doxycyline for the facial rash prescribed  Today,.  Urgent referral to [place] dermatology      Fatigue   Relevant Orders   CBC with Differential/Platelet (Completed)   Hyperlipidemia    Relevant Orders   Lipid Profile (Completed)   Direct LDL (Completed)   Hypothyroidism due to acquired atrophy of thyroid - Primary    .Patient's thyroid function is underactive.  Unclear what dose of  levothyroxine  She is taking: 88 or 75 mcg daily.   Increase to 88 mcg daily and repeat tsh in 6 weeks       Relevant Medications   liothyronine (CYTOMEL) 25 MCG tablet   Other Relevant Orders   TSH (Completed)   Type 2 diabetes mellitus with other specified complication ([place])    She has been lost to follow up for over a year.  Labs will be drawn today and have her follow up.  Foot exam  Done  Eye exam up to date       Relevant Orders   Comp Met (CMET) (Completed)   Urine Microalbumin w/creat. ratio (Completed)   POCT HgB A1C (Completed)   Other Visit Diagnoses     Encounter for screening mammogram for malignant neoplasm of breast       Relevant Orders   MM 3D SCREEN BREAST BILATERAL   Rash       Relevant Orders   Ambulatory referral to Dermatology   Colon cancer screening       Encounter for colonoscopy in patient with family history of colon polyps       Relevant Orders   Ambulatory referral to Gastroenterology       I spent a total of  43  minutes with this patient in a face to face visit on the date of this encounter reviewing the last several office visits for her facial rash. patient's diet and eating habits,most recent imaging study ,   and post visit ordering of testing and therapeutics.    Follow-up: No follow-ups on file.   Crecencio Mc, MD

## 2021-09-07 NOTE — Patient Instructions (Signed)
I am putting you on One more round of doxycycline and 8 days of prednisone  Nothing around the eyes  no makeup no honey  No more threading; my guess is they used some form of astringent that irritated your skin  Referral to [facility] made;  they are on [address]  You are at increased risk for colon Cancer because of  polyps,  so you need another colonoscopy.  Referral in progress    Return for follow up on labs drawn today   The [employer] vaccine is now available  for free at your pharmacy  PLEASE GET YOUR FIRST DOSE THIS MONTH AND YOUR SECOND DOSE BEFORE THE END OF  march

## 2021-09-09 ENCOUNTER — Telehealth: Payer: Self-pay | Admitting: Internal Medicine

## 2021-09-09 NOTE — Telephone Encounter (Signed)
Copied from CRM [id number]. Topic: Medicare AWV >> Sep 09, 2021  1:45 PM Payton Doughty wrote: Reason for CRM: Left message for patient to schedule Annual Wellness Visit.  Please schedule with Nurse Health Advisor Denisa O'Brien-Blaney, LPN at [facility]. This appt can be telephone or office visit.  Please call [phone] ask for [facility]

## 2021-09-09 NOTE — Addendum Note (Signed)
Addended by: Sherlene Shams on: 09/09/2021 04:17 PM   Modules accepted: Orders

## 2021-09-09 NOTE — Assessment & Plan Note (Addendum)
.  Patient's thyroid function is underactive.  Unclear what dose of  levothyroxine  She is taking: 88 or 75 mcg daily.   Increase to 88 mcg daily and repeat tsh in 6 weeks

## 2021-09-10 ENCOUNTER — Other Ambulatory Visit: Payer: Self-pay

## 2021-09-10 DIAGNOSIS — E034 Atrophy of thyroid (acquired): Secondary | ICD-10-CM

## 2021-09-10 NOTE — Progress Notes (Signed)
Informed pt of lab results and repeat TSH in 6 weeks . Order is in

## 2021-09-16 ENCOUNTER — Telehealth: Payer: Self-pay | Admitting: Internal Medicine

## 2021-09-16 NOTE — Telephone Encounter (Signed)
Patient saw Dr Darrick Huntsman on Sept 5th for a staph infection. She states she had a 101.7 temp, but at time of call it was 98.2. She was wondering if she needed more antibiotics..  She also was wondering If Dr Darrick Huntsman did a referral to [facility].[name]

## 2021-09-17 MED ORDER — CIPROFLOXACIN HCL 0.3 % OP SOLN: 2.0000 [drp] | OPHTHALMIC | 0 refills | Status: DC

## 2021-09-17 NOTE — Addendum Note (Signed)
Addended by: Sherlene Shams on: 09/17/2021 04:48 PM   Modules accepted: Orders

## 2021-09-17 NOTE — Telephone Encounter (Signed)
The prednisone is causing the swelling in her hand and may be causing the eyelids to swell, so we will not repeat that   I have called in an antibiotic eye drop to use.  If symptoms worsen when will need to go to the ER

## 2021-09-17 NOTE — Telephone Encounter (Signed)
Patient states her eyelids are both swollen and red, but the left one is worse.  Patient states her lip feels numb.  Patient states yesterday and today when she woke up, her left hand was swollen and she couldn't get her ring off without using soap.  Patient states her temperature is 97.9.  Patient states her last dose of prednisone was yesterday and her last antibiotic will be today.  Patient states she would really appreciate a call today, as she is concerned that she feels like this and we are going into the weekend.  Patient states Dr. Duncan Dull was going to refer her to a dermatologist, but she has not heard from [facility].  I let patient know that the referral has been put in and I gave her the number for [facility] so she can schedule an appointment.

## 2021-09-17 NOTE — Telephone Encounter (Signed)
Pt is calling about previous message sent 

## 2021-09-17 NOTE — Telephone Encounter (Signed)
Spoke with pt and advised her of the message below. Pt gave a verbal understanding.  

## 2021-09-20 ENCOUNTER — Telehealth: Payer: Self-pay | Admitting: Internal Medicine

## 2021-09-20 NOTE — Telephone Encounter (Signed)
Pt need a refill on ciprofloxacin sent to [facility]

## 2021-09-21 MED ORDER — CIPROFLOXACIN HCL 0.3 % OP SOLN: 2.0000 [drp] | OPHTHALMIC | 0 refills | Status: DC

## 2021-09-21 NOTE — Telephone Encounter (Signed)
LMTCB

## 2021-09-21 NOTE — Telephone Encounter (Signed)
Cipro eye drops resent

## 2021-09-21 NOTE — Telephone Encounter (Signed)
Pt stated that she lost her eye drops and would like to see if another rx could be sent in.

## 2021-09-24 ENCOUNTER — Encounter: Payer: Self-pay | Admitting: Internal Medicine

## 2021-09-24 NOTE — Telephone Encounter (Signed)
Pt returning call and she states her eyes are swollen and she can only open them half way. Pt also states she is tired and has a sore throat. I asked pt would she like to make an appointment today with one of our providers that is available she stated she will keep her appointment for Monday she just want something called in. I let her know the cipro was resent to the pharmacy

## 2021-09-24 NOTE — Telephone Encounter (Signed)
LMTCB to see if patient wanted to do VV at 4p with Mable Paris.

## 2021-09-24 NOTE — Telephone Encounter (Signed)
Spoke with [name] and advised that she will need to be seen at urgent. [name] stated that she she will keep her appt with Dr. Derrel Nip on Monday but if symptoms worsen she will go to UC over the weekend.

## 2021-09-25 ENCOUNTER — Ambulatory Visit
Admission: RE | Admit: 2021-09-25 | Discharge: 2021-09-25 | Disposition: A | Discharge status: Home / Self Care | Payer: Medicare Other | Source: Ambulatory Visit | Attending: Emergency Medicine | Admitting: Emergency Medicine

## 2021-09-25 VITALS — BP 122/83 | HR 91 | Temp 98.4°F | Resp 18 | Ht 65.0 in | Wt 266.2 lb

## 2021-09-25 DIAGNOSIS — R21 Rash and other nonspecific skin eruption: Secondary | ICD-10-CM

## 2021-09-25 MED ORDER — ERYTHROMYCIN 5 MG/GM OP OINT: TOPICAL_OINTMENT | OPHTHALMIC | 0 refills | Status: DC

## 2021-09-25 NOTE — ED Provider Notes (Signed)
[name]    CSN: [id number] Arrival date & time: 09/25/21  1103      History   Chief Complaint Chief Complaint  Patient presents with   Eye Problem    Skin around eyes inflamed. Sore throat. Ulcers in both corners of mouth. Extremely tired. - Entered by patient    HPI [name] is a 66 y.o. female.  Patient presents with pruritic rash around her eyes, and eyebrow area, and on her chin x approximately 10 weeks.  The rash started after she had her eyebrows threaded.  She has been seen and treated multiple times for this and has scheduled an appointment with dermatology.  No eye pain or drainage.  No fever or chills.  Patient was seen at another urgent care on 07/30/2021; diagnosed with cellulitis of face and atopic dermatitis; treated with Bactrim and mupirocin ointment.  She was seen at [facility] clinic on 08/24/2021 diagnosed with preseptal cellulitis of left eye and periocular dermatitis; treated with doxycycline and prednisone.  She was seen by her PCP on 09/07/2021; diagnosed with facial rash; treated with prednisone and doxycycline again.  She contacted her PCP on 09/21/2021 and was prescribed Cipro eyedrops.  Her medical history includes hypertension, hypothyroidism, diabetes, hypercholesterolemia, postviral fatigue syndrome, arthritis, anxiety, OSA, pulmonary nodule, overactive bladder, bipolar 1 disorder, fatty liver, B12 deficiency.  The history is provided by the patient, medical records and the spouse.    Past Medical History:  Diagnosis Date   Anxiety    Arthritis    Atopic dermatitis    scalp   COVID    2020 and 2021   Depression    Hyperlipidemia    Hypertension    pt denies   Thyroid disease    Tobacco abuse     Patient Active Problem List   Diagnosis Date Noted   Facial rash 09/07/2021   COVID-19 long hauler manifesting chronic fatigue 07/04/2020   Elevated liver enzymes 04/01/2020   Suspected COVID-19 virus infection 05/28/2018   PSVT (paroxysmal  supraventricular tachycardia) (HCC) 05/11/2017   OSA (obstructive sleep apnea) 10/17/2016   Fatigue 10/03/2016   Pulmonary nodule less than 6 cm determined by computed tomography of lung 01/13/2016   Overactive bladder 12/15/2015   Type 2 diabetes mellitus with other specified complication (HCC) 10/10/2015   Bipolar I disorder, most recent episode depressed (HCC) 06/04/2014   Hypovitaminosis D 12/12/2013   Encounter for preventive health examination 12/09/2013   Constipation 04/22/2013   Lumbago 08/17/2012   History of acute pancreatitis 07/11/2012   Colon polyp, hyperplastic 04/30/2012   B12 deficiency 04/11/2011   Fatty liver disease, nonalcoholic 04/11/2011   Obesity, Class III, BMI 40-49.9 (morbid obesity) (HCC) 04/11/2011   Tobacco abuse    Hypothyroidism due to acquired atrophy of thyroid    Hyperlipidemia    Arthritis    Atopic dermatitis    Anxiety     Past Surgical History:  Procedure Laterality Date   LUMBAR DISC SURGERY     ROTATOR CUFF REPAIR Bilateral     OB History   No obstetric history on file.      Home Medications    Prior to Admission medications   Medication Sig Start Date End Date Taking? Authorizing Provider  erythromycin ophthalmic ointment Place a 1/2 inch ribbon of ointment into the lower eyelid. 09/25/21  Yes Mickie Bail, NP  albuterol (VENTOLIN HFA) 108 (90 Base) MCG/ACT inhaler Inhale 2 puffs into the lungs every 6 (six) hours as needed for  wheezing or shortness of breath. 07/02/20   Crecencio Mc, MD  amphetamine-dextroamphetamine (ADDERALL) 20 MG tablet Take 20 mg by mouth daily. 08/25/21   [provider]  benzonatate (TESSALON) 200 MG capsule TAKE 1 CAPSULE (200 MG TOTAL) BY MOUTH 3 (THREE) TIMES DAILY AS NEEDED FOR COUGH. 11/13/20   Crecencio Mc, MD  ciprofloxacin (CILOXAN) 0.3 % ophthalmic solution Place 2 drops into both eyes every 4 (four) hours while awake. Administer 1 drop, every 2 hours, while awake, for 2 days. Then 1  drop, every 4 hours, while awake, for the next 5 days. 09/21/21   Crecencio Mc, MD  clonazePAM (KLONOPIN) 0.5 MG tablet TAKE 1/2 OF A TABLET (0.25 MG TOTAL) BY MOUTH TWICE A DAY 11/13/20   Crecencio Mc, MD  doxycycline (VIBRA-TABS) 100 MG tablet Take 1 tablet (100 mg total) by mouth 2 (two) times daily. 09/07/21   Crecencio Mc, MD  DULoxetine (CYMBALTA) 60 MG capsule Take 60 mg by mouth daily.  07/05/18   [provider]  gabapentin (NEURONTIN) 300 MG capsule Take 1 capsule (300 mg total) by mouth 3 (three) times daily. Patient taking differently: Take 300 mg by mouth 2 (two) times daily. 11/27/17   Crecencio Mc, MD  gabapentin (NEURONTIN) 600 MG tablet Take 600 mg by mouth 2 (two) times daily. 08/24/21   [provider]  levothyroxine (SYNTHROID) 88 MCG tablet TAKE 1 TABLET BY MOUTH  DAILY 11/16/20   Crecencio Mc, MD  liothyronine (CYTOMEL) 25 MCG tablet Take 25 mcg by mouth daily. Takes 1/2 tablet BID 09/02/21   [provider]  melatonin 5 MG TABS Take 5 mg by mouth at bedtime.    [provider]  metFORMIN (GLUCOPHAGE-XR) 500 MG 24 hr tablet TAKE 1 TABLET BY MOUTH EVERY DAY WITH BREAKFAST 08/22/21   Dutch Quint B, FNP  metoprolol succinate (TOPROL-XL) 25 MG 24 hr tablet TAKE 1 TABLET BY MOUTH  DAILY 06/22/20   Crecencio Mc, MD  mirabegron ER (MYRBETRIQ) 50 MG TB24 tablet Take 50 mg by mouth daily.    [provider]  mupirocin ointment (BACTROBAN) 2 % Apply topically 3 (three) times daily. 07/30/21   [provider]  ondansetron (ZOFRAN) 4 MG tablet Take 1 tablet (4 mg total) by mouth every 8 (eight) hours as needed for nausea or vomiting. 03/19/20   McLean-Scocuzza, Nino Glow, MD  predniSONE (DELTASONE) 10 MG tablet 6 tablets daily for 3 days, then reduce by 1 tablet daily until gone 09/07/21   Crecencio Mc, MD  QUEtiapine (SEROQUEL) 300 MG tablet Take 300 mg by mouth at bedtime. 06/30/21   [provider]  ropinirole  (REQUIP) 5 MG tablet Take 5 mg by mouth as needed.  09/13/18   [provider]  tizanidine (ZANAFLEX) 6 MG capsule TAKE 1 CAPSULE BY MOUTH THREE TIMES A DAY AS NEEDED FOR MUSCLE SPASMS 09/02/16   Crecencio Mc, MD    Family History Family History  Problem Relation Age of Onset   Arthritis Mother    Macular degeneration Mother    Neuropathy Mother    Heart disease Father    Breast cancer Neg Hx     Social History Social History   Tobacco Use   Smoking status: Former    Packs/day: 0.25    Types: Cigarettes    Quit date: 01/02/2016    Years since quitting: 5.7   Smokeless tobacco: Never  Substance Use Topics  Alcohol use: Yes    Comment: once a week    Drug use: No     Allergies   Ancef [cefazolin sodium], Penicillins, Clarithromycin, Doxycycline, Eggs or egg-derived products, Seroquel [quetiapine fumarate], Sulfamethoxazole-trimethoprim, and Other   Review of Systems Review of Systems  Constitutional:  Negative for chills and fever.  Eyes:  Positive for redness and itching. Negative for pain, discharge and visual disturbance.  Skin:  Positive for color change and rash.  All other systems reviewed and are negative.    Physical Exam Triage Vital Signs ED Triage Vitals [09/25/21 1118]  Enc Vitals Group     BP 122/83     Pulse Rate 91     Resp 18     Temp 98.4 F (36.9 C)     Temp src      SpO2 95 %     Weight      Height      Head Circumference      Peak Flow      Pain Score      Pain Loc      Pain Edu?      Excl. in GC?    No data found.  Updated Vital Signs BP 122/83   Pulse 91   Temp 98.4 F (36.9 C)   Resp 18   Ht 5\' 5"  (1.651 m)   Wt 266 lb 3.2 oz (120.7 kg)   SpO2 95%   BMI 44.30 kg/m   Visual Acuity Right Eye Distance:   Left Eye Distance:   Bilateral Distance:    Right Eye Near:   Left Eye Near:    Bilateral Near:     Physical Exam Vitals and nursing note reviewed.  Constitutional:      General: She is not in acute  distress.    Appearance: She is well-developed. She is obese.  HENT:     Right Ear: Tympanic membrane normal.     Left Ear: Tympanic membrane normal.     Nose: Nose normal.     Mouth/Throat:     Mouth: Mucous membranes are moist.     Pharynx: Oropharynx is clear.  Eyes:     General:        Right eye: No discharge.        Left eye: No discharge.     Extraocular Movements: Extraocular movements intact.     Conjunctiva/sclera: Conjunctivae normal.     Pupils: Pupils are equal, round, and reactive to light.     Comments: Dry erythematous rash around eyes, L>R and in eyebrow area and on chin.   Cardiovascular:     Rate and Rhythm: Normal rate and regular rhythm.     Heart sounds: Normal heart sounds.  Pulmonary:     Effort: Pulmonary effort is normal. No respiratory distress.     Breath sounds: Normal breath sounds.  Musculoskeletal:     Cervical back: Neck supple.  Skin:    General: Skin is warm and dry.  Neurological:     Mental Status: She is alert.  Psychiatric:        Mood and Affect: Mood normal.        Behavior: Behavior normal.       UC Treatments / Results  Labs (all labs ordered are listed, but only abnormal results are displayed) Labs Reviewed - No data to display  EKG   Radiology No results found.  Procedures Procedures (including critical care time)  Medications Ordered in UC Medications -  No data to display  Initial Impression / Assessment and Plan / UC Course  I have reviewed the triage vital signs and the nursing notes.  Pertinent labs & imaging results that were available during my care of the patient were reviewed by me and considered in my medical decision making (see chart for details).   Facial rash.  Afebrile and vital signs are stable.  Since the onset of her rash, patient has been treated with Bactrim, doxycycline twice, prednisone twice, and Cipro eyedrops.  She has an appointment scheduled with dermatology at their soonest available  which is in December.  Treating today with erythromycin ophthalmic ointment to use around her eyes.  Instructed her to follow-up with her PCP on Monday.  She agrees to plan of care.   Final Clinical Impressions(s) / UC Diagnoses   Final diagnoses:  Facial rash     Discharge Instructions      Use the erythromycin ointment as directed.  Stop using it if you have worsening symptoms.  Follow up with your primary care provider on Monday.        ED Prescriptions     Medication Sig Dispense Auth. Provider   erythromycin ophthalmic ointment Place a 1/2 inch ribbon of ointment into the lower eyelid. 3.5 g Mickie Bail, NP      PDMP not reviewed this encounter.   Mickie Bail, NP 09/25/21 1201

## 2021-09-25 NOTE — ED Triage Notes (Addendum)
Patient to Urgent Care with complaints of bilateral eye swelling and inflammation. Reports her eyes have been swollen and puffy for approx 10 weeks, is unable to see a derm until December. Problem started after having her eyebrows threaded. Has been using antibiotic drops but eyes have been itchy and sore.  Also complains of a sore throat, ulcers in the corner of her mouth, and fatigue. Poor appetite.

## 2021-09-25 NOTE — Discharge Instructions (Addendum)
Use the erythromycin ointment as directed.  Stop using it if you have worsening symptoms.  Follow up with your primary care provider on Monday.

## 2021-09-27 ENCOUNTER — Encounter: Payer: Self-pay | Admitting: Internal Medicine

## 2021-09-27 ENCOUNTER — Ambulatory Visit (INDEPENDENT_AMBULATORY_CARE_PROVIDER_SITE_OTHER): Payer: Medicare Other

## 2021-09-27 ENCOUNTER — Ambulatory Visit (INDEPENDENT_AMBULATORY_CARE_PROVIDER_SITE_OTHER): Payer: Medicare Other | Admitting: Internal Medicine

## 2021-09-27 VITALS — BP 132/84 | HR 106 | Temp 98.3°F | Ht 65.0 in | Wt 275.6 lb

## 2021-09-27 DIAGNOSIS — M3390 Dermatopolymyositis, unspecified, organ involvement unspecified: Secondary | ICD-10-CM

## 2021-09-27 DIAGNOSIS — R918 Other nonspecific abnormal finding of lung field: Secondary | ICD-10-CM

## 2021-09-27 DIAGNOSIS — R0602 Shortness of breath: Secondary | ICD-10-CM

## 2021-09-27 LAB — CBC WITH DIFFERENTIAL/PLATELET
Basophils Absolute: 0 10*3/uL (ref 0.0–0.1)
Basophils Relative: 0.8 % (ref 0.0–3.0)
Eosinophils Absolute: 0.1 10*3/uL (ref 0.0–0.7)
Eosinophils Relative: 2.3 % (ref 0.0–5.0)
HCT: 38.5 % (ref 36.0–46.0)
Hemoglobin: 12.8 g/dL (ref 12.0–15.0)
Lymphocytes Relative: 27.2 % (ref 12.0–46.0)
Lymphs Abs: 1.6 10*3/uL (ref 0.7–4.0)
MCHC: 33.2 g/dL (ref 30.0–36.0)
MCV: 93 fl (ref 78.0–100.0)
Monocytes Absolute: 0.2 10*3/uL (ref 0.1–1.0)
Monocytes Relative: 4.3 % (ref 3.0–12.0)
Neutro Abs: 3.8 10*3/uL (ref 1.4–7.7)
Neutrophils Relative %: 65.4 % (ref 43.0–77.0)
Platelets: 227 10*3/uL (ref 150.0–400.0)
RBC: 4.14 Mil/uL (ref 3.87–5.11)
RDW: 15 % (ref 11.5–15.5)
WBC: 5.8 10*3/uL (ref 4.0–10.5)

## 2021-09-27 LAB — CK: Total CK: 28 U/L (ref 7–177)

## 2021-09-27 LAB — COMPREHENSIVE METABOLIC PANEL
ALT: 30 U/L (ref 0–35)
AST: 19 U/L (ref 0–37)
Albumin: 3.8 g/dL (ref 3.5–5.2)
Alkaline Phosphatase: 41 U/L (ref 39–117)
BUN: 17 mg/dL (ref 6–23)
CO2: 28 mEq/L (ref 19–32)
Calcium: 9.2 mg/dL (ref 8.4–10.5)
Chloride: 101 mEq/L (ref 96–112)
Creatinine, Ser: 0.91 mg/dL (ref 0.40–1.20)
GFR: 64.8 mL/min (ref >60.00)
Glucose, Bld: 294 mg/dL — ABNORMAL HIGH (ref 70–99)
Potassium: 4 mEq/L (ref 3.5–5.1)
Sodium: 139 mEq/L (ref 135–145)
Total Bilirubin: 0.4 mg/dL (ref 0.2–1.2)
Total Protein: 6.2 g/dL (ref 6.0–8.3)

## 2021-09-27 LAB — SEDIMENTATION RATE: Sed Rate: 31 mm/hr — ABNORMAL HIGH (ref 0–30)

## 2021-09-27 LAB — C-REACTIVE PROTEIN: CRP: <1 mg/dL (ref 0.5–20.0)

## 2021-09-27 MED ORDER — TRIAMCINOLONE ACETONIDE 0.1 % EX CREA: 1.0000 | TOPICAL_CREAM | Freq: Two times a day (BID) | CUTANEOUS | 0 refills | Status: DC

## 2021-09-27 NOTE — Telephone Encounter (Signed)
Pt is scheduled to be seen by Dr. Derrel Nip this morning at 11 am.

## 2021-09-27 NOTE — Assessment & Plan Note (Signed)
Suspected  Given failure to resolve with 3-4  rounds of treatment for cellulitis/conhuncitivities.    She has mild proximal muscle weakness and reports with walking,  Unclear if these symptoms are due to obesity and decondition   Triamcinolone cream prescribed today given the swollen irritated appearance of upper eyelid and new patch on chin.  Workup for dermatomyositis ordered : chest x ray and labs .  Will try to get her derm appt moved up from Dec with Nehemiah Massed

## 2021-09-27 NOTE — Progress Notes (Signed)
Subjective:  Patient ID: [name], female    DOB: Aug 02, 1955  Age: 66 y.o. MRN: [id number]  CC: The primary encounter diagnosis was Heliotrope rash of eyelid ([place]). A diagnosis of Shortness of breath was also pertinent to this visit.   HPI [name] presents for follow up on worsening orbital rash WHICH has been present and worsening for several months despite treatment for cellulitis   Seen several weeks ago for follow up on orbital rash that started after she had her eyebrows threaded.  Tried treating at home with a commercial product containing manuka honey ,  cold compresses..  did not improve was initially treated by urgent  care on July 28   with Septra and mupirocin,  developed a rash to septra and worsening erythema of eyelids to mupiricin .[name] Returned to [facility] on August 22   received doxycycline  /prednisone which helped but did not resolve rash .    Saw PCP (me) on Sept 5.  , doxy/prednisone repeated,  no improvement.  cipro eye drops for 5 days  for concern of conjunctivitis.  No change   Her regimen for the last several works is  Cetaphil for sensitive skin,  followed by an eye  wash,  pats dry, and uses hot /cool compresses.  Eyelids have become purple, swollen ,scaling and itchy,  has an macular annular rash on chin  She endorses fatigue,  malaise , has had some proximal  weakness,   mild shortness of breath with walking  . (Could not rise out of chair on her first  attempt without pushing off)  Derm appt is in December   Lab Results  Component Value Date   HGBA1C 6.8 (A) 09/07/2021      Outpatient Medications Prior to Visit  Medication Sig Dispense Refill   albuterol (VENTOLIN HFA) 108 (90 Base) MCG/ACT inhaler Inhale 2 puffs into the lungs every 6 (six) hours as needed for wheezing or shortness of breath. 1 each 2   amphetamine-dextroamphetamine (ADDERALL) 20 MG tablet Take 20 mg by mouth daily.     clonazePAM (KLONOPIN) 0.5 MG tablet TAKE 1/2 OF A TABLET (0.25 MG  TOTAL) BY MOUTH TWICE A DAY 30 tablet 2   DULoxetine (CYMBALTA) 60 MG capsule Take 60 mg by mouth daily.      erythromycin ophthalmic ointment Place a 1/2 inch ribbon of ointment into the lower eyelid. 3.5 g 0   gabapentin (NEURONTIN) 600 MG tablet Take 600 mg by mouth 2 (two) times daily.     levothyroxine (SYNTHROID) 88 MCG tablet TAKE 1 TABLET BY MOUTH  DAILY 90 tablet 3   liothyronine (CYTOMEL) 25 MCG tablet Take 25 mcg by mouth daily. Takes 1/2 tablet BID     melatonin 5 MG TABS Take 5 mg by mouth at bedtime.     metFORMIN (GLUCOPHAGE-XR) 500 MG 24 hr tablet TAKE 1 TABLET BY MOUTH EVERY DAY WITH BREAKFAST 90 tablet 1   metoprolol succinate (TOPROL-XL) 25 MG 24 hr tablet TAKE 1 TABLET BY MOUTH  DAILY 90 tablet 3   mirabegron ER (MYRBETRIQ) 50 MG TB24 tablet Take 50 mg by mouth daily.     ondansetron (ZOFRAN) 4 MG tablet Take 1 tablet (4 mg total) by mouth every 8 (eight) hours as needed for nausea or vomiting. 40 tablet 0   QUEtiapine (SEROQUEL) 300 MG tablet Take 300 mg by mouth at bedtime.     ropinirole (REQUIP) 5 MG tablet Take 5 mg by mouth as needed.  tizanidine (ZANAFLEX) 6 MG capsule TAKE 1 CAPSULE BY MOUTH THREE TIMES A DAY AS NEEDED FOR MUSCLE SPASMS 60 capsule 2   benzonatate (TESSALON) 200 MG capsule TAKE 1 CAPSULE (200 MG TOTAL) BY MOUTH 3 (THREE) TIMES DAILY AS NEEDED FOR COUGH. (Patient not taking: Reported on 09/27/2021) 60 capsule 1   ciprofloxacin (CILOXAN) 0.3 % ophthalmic solution Place 2 drops into both eyes every 4 (four) hours while awake. Administer 1 drop, every 2 hours, while awake, for 2 days. Then 1 drop, every 4 hours, while awake, for the next 5 days. (Patient not taking: Reported on 09/27/2021) 5 mL 0   doxycycline (VIBRA-TABS) 100 MG tablet Take 1 tablet (100 mg total) by mouth 2 (two) times daily. (Patient not taking: Reported on 09/27/2021) 20 tablet 0   gabapentin (NEURONTIN) 300 MG capsule Take 1 capsule (300 mg total) by mouth 3 (three) times daily. (Patient  taking differently: Take 300 mg by mouth 2 (two) times daily.) 90 capsule 3   mupirocin ointment (BACTROBAN) 2 % Apply topically 3 (three) times daily. (Patient not taking: Reported on 09/27/2021)     predniSONE (DELTASONE) 10 MG tablet 6 tablets daily for 3 days, then reduce by 1 tablet daily until gone 33 tablet 0   No facility-administered medications prior to visit.    Review of Systems;  Patient denies headache, fevers, malaise, unintentional weight loss, skin rash, eye pain, sinus congestion and sinus pain, sore throat, dysphagia,  hemoptysis , cough, wheezing, chest pain, palpitations, orthopnea, edema, abdominal pain, nausea, melena, diarrhea, constipation, flank pain, dysuria, hematuria, urinary  Frequency, nocturia, numbness, tingling, seizures,  Focal weakness, Loss of consciousness,  Tremor, insomnia, depression, anxiety, and suicidal ideation.      Objective:  BP 132/84 (BP Location: Left Arm, Patient Position: Sitting, Cuff Size: Large)   Pulse (!) 106   Temp 98.3 F (36.8 C) (Oral)   Ht 5\' 5"  (1.651 m)   Wt 275 lb 9.6 oz (125 kg)   SpO2 95%   BMI 45.86 kg/m   BP Readings from Last 3 Encounters:  09/27/21 132/84  09/25/21 122/83  09/07/21 130/88    Wt Readings from Last 3 Encounters:  09/27/21 275 lb 9.6 oz (125 kg)  09/25/21 266 lb 3.2 oz (120.7 kg)  09/07/21 266 lb 3.2 oz (120.7 kg)    General appearance: alert, cooperative and appears stated age Eyelids: bilateral dark purple discoloration with edema and fissures..   Ears: normal TM's and external ear canals both ears Throat: lips, mucosa, and tongue normal; teeth and gums normal Neck: no adenopathy, no carotid bruit, supple, symmetrical, trachea midline and thyroid not enlarged, symmetric, no tenderness/mass/nodules Back: symmetric, no curvature. ROM normal. No CVA tenderness. Lungs: clear to auscultation bilaterally Heart: regular rate and rhythm, S1, S2 normal, no murmur, click, rub or gallop Abdomen:  soft, non-tender; bowel sounds normal; no masses,  no organomegaly Pulses: 2+ and symmetric Skin: Skin color, texture, turgor normal. No Gottron's papules  Lymph nodes: Cervical, supraclavicular, and axillary nodes normal. Neuro:  awake and interactive with normal mood and affect. Higher cortical functions are normal. Speech is clear without word-finding difficulty or dysarthria. Extraocular movements are intact. Visual fields of both eyes are grossly intact. Sensation to light touch is grossly intact bilaterally of upper and lower extremities. Motor examination shows 4+/5 symmetric hand grip and upper extremity and 4+/5 lower extremity strength. There is no pronation or drift. Gait is non-ataxic   Lab Results  Component Value Date  HGBA1C 6.8 (A) 09/07/2021   HGBA1C 6.3 02/26/2020   HGBA1C 6.1 10/05/2018    Lab Results  Component Value Date   CREATININE 1.00 09/07/2021   CREATININE 0.99 04/07/2020   CREATININE 1.0 02/26/2020    Lab Results  Component Value Date   WBC 11.3 (H) 09/07/2021   HGB 14.2 09/07/2021   HCT 43.8 09/07/2021   PLT 289.0 09/07/2021   GLUCOSE 99 09/07/2021   CHOL 203 (H) 09/07/2021   TRIG 101.0 09/07/2021   HDL 70.70 09/07/2021   LDLDIRECT 120.0 09/07/2021   LDLCALC 112 (H) 09/07/2021   ALT 24 09/07/2021   AST 14 09/07/2021   NA 140 09/07/2021   K 4.6 09/07/2021   CL 101 09/07/2021   CREATININE 1.00 09/07/2021   BUN 23 09/07/2021   CO2 29 09/07/2021   TSH 6.71 (H) 09/07/2021   HGBA1C 6.8 (A) 09/07/2021   MICROALBUR 0.9 09/07/2021    No results found.  Assessment & Plan:   Problem List Items Addressed This Visit     Heliotrope rash of eyelid ([place]) - Primary    Suspected  Given failure to resolve with 3-4  rounds of treatment for cellulitis/conhuncitivities.    She has mild proximal muscle weakness and reports with walking,  Unclear if these symptoms are due to obesity and decondition   Triamcinolone cream prescribed today given the swollen  irritated appearance of upper eyelid and new patch on chin.  Workup for dermatomyositis ordered : chest x ray and labs .  Will try to get her derm appt moved up from Dec with Nehemiah Massed      Relevant Orders   CK (Creatine Kinase)   Comprehensive metabolic panel   Sedimentation rate   C-reactive protein   ANA   Myositis Assessr Plus Jo-1 Autoabs-([facility])   CBC with Differential/Platelet   Other Visit Diagnoses     Shortness of breath       Relevant Orders   DG Chest 2 View       I spent a total of 42 minutes with this patient in a face to face visit on the date of this encounter reviewing the last office visit with me,  all recent visit s with Urgent are,   patient's diet and exercise habits,    and post visit ordering of testing and therapeutics.    Follow-up: No follow-ups on file.   Crecencio Mc, MD

## 2021-09-27 NOTE — Patient Instructions (Signed)
Use cetirizine (generic for Zyrtec 0 4 times daily for the itching   APPLY A THIN LAYER OF Triamcinolone cream on the eyelids twice daily   TESTING TODAY FOR AUTO IMMUNE CAUSES

## 2021-09-28 ENCOUNTER — Telehealth: Payer: Self-pay

## 2021-09-28 ENCOUNTER — Telehealth: Payer: Self-pay | Admitting: Internal Medicine

## 2021-09-28 DIAGNOSIS — R918 Other nonspecific abnormal finding of lung field: Secondary | ICD-10-CM | POA: Insufficient documentation

## 2021-09-28 LAB — ANA: Anti Nuclear Antibody (ANA): NEGATIVE

## 2021-09-28 NOTE — Assessment & Plan Note (Signed)
Noted on screening chest xray during workup for heliotroipic rash of eyelids.  Given concern for dermatomyositis.  Chest CT ordered

## 2021-09-28 NOTE — Telephone Encounter (Signed)
-----   Message from Crecencio Mc, MD sent at 09/28/2021  1:05 PM EDT ----- Your chest x ray showed a new infiltrate .  I would like to get a better look at this area with a chest CT and have ordered this study.   Regards,   Dr. Derrel Nip

## 2021-09-28 NOTE — Telephone Encounter (Signed)
Lft pt vm to call ofc . thanks 

## 2021-09-28 NOTE — Telephone Encounter (Signed)
LMTCB in regards to x ray results.  

## 2021-09-28 NOTE — Telephone Encounter (Signed)
LMTCB with [facility]. Need to move up pt's appt. Please transfer to [place].

## 2021-10-01 ENCOUNTER — Telehealth: Payer: Self-pay | Admitting: Internal Medicine

## 2021-10-01 ENCOUNTER — Encounter: Payer: Self-pay | Admitting: Internal Medicine

## 2021-10-01 NOTE — Telephone Encounter (Signed)
Patient called to schedule her CT Scan.

## 2021-10-04 ENCOUNTER — Telehealth: Payer: Self-pay | Admitting: Internal Medicine

## 2021-10-04 NOTE — Telephone Encounter (Signed)
Left pt vm to call ofc thank you!

## 2021-10-06 LAB — MYOSITIS SPECIFIC II ANTIBODIES PANEL
EJ AB: <11 SI (ref <11)
JO-1 AB: <11 SI (ref <11)
MDA-5 AB: <11 SI (ref <11)
MI-2 ALPHA AB: <11 SI (ref <11)
MI-2 BETA AB: <11 SI (ref <11)
NXP-2 AB: <11 SI (ref <11)
OJ AB: <11 SI (ref <11)
PL-12 AB: <11 SI (ref <11)
PL-7 AB: <11 SI (ref <11)
SRP-AB: <11 SI (ref <11)
TIF-1y AB: <11 SI (ref <11)

## 2021-10-13 ENCOUNTER — Ambulatory Visit (INDEPENDENT_AMBULATORY_CARE_PROVIDER_SITE_OTHER): Payer: Medicare Other

## 2021-10-13 VITALS — Ht 65.0 in | Wt 275.0 lb

## 2021-10-13 DIAGNOSIS — Z0000 Encounter for general adult medical examination without abnormal findings: Secondary | ICD-10-CM | POA: Diagnosis not present

## 2021-10-13 NOTE — Progress Notes (Addendum)
Subjective:   [name] is a 66 y.o. female who presents for Medicare Annual (Subsequent) preventive examination.  Review of Systems    No ROS.  Medicare Wellness Virtual Visit.  Visual/audio telehealth visit, UTA vital signs.   See social history for additional risk factors.   Cardiac Risk Factors include: advanced age (>79men, >58 women)     Objective:    Today's Vitals   10/13/21 1334  Weight: 275 lb (124.7 kg)  Height: 5\' 5"  (1.651 m)   Body mass index is 45.76 kg/m.     10/13/2021    1:42 PM 09/25/2021   11:35 AM 08/24/2020    2:19 PM 08/22/2019    2:24 PM 02/14/2016   11:16 AM 02/12/2016    3:55 PM  Advanced Directives  Does Patient Have a Medical Advance Directive? No No No No No No  Would patient like information on creating a medical advance directive? No - Patient declined  No - Patient declined Yes (MAU/Ambulatory/Procedural Areas - Information given)      Current Medications (verified) Outpatient Encounter Medications as of 10/13/2021  Medication Sig   albuterol (VENTOLIN HFA) 108 (90 Base) MCG/ACT inhaler Inhale 2 puffs into the lungs every 6 (six) hours as needed for wheezing or shortness of breath.   amphetamine-dextroamphetamine (ADDERALL) 20 MG tablet Take 20 mg by mouth daily.   clonazePAM (KLONOPIN) 0.5 MG tablet TAKE 1/2 OF A TABLET (0.25 MG TOTAL) BY MOUTH TWICE A DAY   DULoxetine (CYMBALTA) 60 MG capsule Take 60 mg by mouth daily.    gabapentin (NEURONTIN) 600 MG tablet Take 600 mg by mouth 2 (two) times daily.   levothyroxine (SYNTHROID) 88 MCG tablet TAKE 1 TABLET BY MOUTH  DAILY   liothyronine (CYTOMEL) 25 MCG tablet Take 25 mcg by mouth daily. Takes 1/2 tablet BID   melatonin 5 MG TABS Take 5 mg by mouth at bedtime.   metFORMIN (GLUCOPHAGE-XR) 500 MG 24 hr tablet TAKE 1 TABLET BY MOUTH EVERY DAY WITH BREAKFAST   metoprolol succinate (TOPROL-XL) 25 MG 24 hr tablet TAKE 1 TABLET BY MOUTH  DAILY   mirabegron ER (MYRBETRIQ) 50 MG TB24 tablet Take  50 mg by mouth daily.   ondansetron (ZOFRAN) 4 MG tablet Take 1 tablet (4 mg total) by mouth every 8 (eight) hours as needed for nausea or vomiting.   QUEtiapine (SEROQUEL) 300 MG tablet Take 300 mg by mouth at bedtime.   ropinirole (REQUIP) 5 MG tablet Take 5 mg by mouth as needed.    tizanidine (ZANAFLEX) 6 MG capsule TAKE 1 CAPSULE BY MOUTH THREE TIMES A DAY AS NEEDED FOR MUSCLE SPASMS   erythromycin ophthalmic ointment Place a 1/2 inch ribbon of ointment into the lower eyelid. (Patient not taking: Reported on 10/13/2021)   triamcinolone cream (KENALOG) 0.1 % Apply 1 Application topically 2 (two) times daily. To eyelids (Patient not taking: Reported on 10/13/2021)   No facility-administered encounter medications on file as of 10/13/2021.    Allergies (verified) Ancef [cefazolin sodium], Penicillins, Clarithromycin, Doxycycline, Eggs or egg-derived products, Seroquel [quetiapine fumarate], Sulfamethoxazole-trimethoprim, and Other   History: Past Medical History:  Diagnosis Date   Anxiety    Arthritis    Atopic dermatitis    scalp   COVID    2020 and 2021   Depression    Hyperlipidemia    Hypertension    pt denies   Thyroid disease    Tobacco abuse    Past Surgical History:  Procedure Laterality Date  LUMBAR DISC SURGERY     ROTATOR CUFF REPAIR Bilateral    Family History  Problem Relation Age of Onset   Arthritis Mother    Macular degeneration Mother    Neuropathy Mother    Heart disease Father    Breast cancer Neg Hx    Social History   Socioeconomic History   Marital status: Married    Spouse name: Not on file   Number of children: Not on file   Years of education: Not on file   Highest education level: Not on file  Occupational History   Not on file  Tobacco Use   Smoking status: Former    Packs/day: 0.25    Types: Cigarettes    Quit date: 01/02/2016    Years since quitting: 5.7   Smokeless tobacco: Never  Substance and Sexual Activity   Alcohol use:  Yes    Comment: once a week    Drug use: No   Sexual activity: Not on file  Other Topics Concern   Not on file  Social History Narrative   Not on file   Social Determinants of Health   Financial Resource Strain: Low Risk  (10/13/2021)   Overall Financial Resource Strain (CARDIA)    Difficulty of Paying Living Expenses: Not hard at all  Food Insecurity: No Food Insecurity (10/13/2021)   Hunger Vital Sign    Worried About Running Out of Food in the Last Year: Never true    [place] in the Last Year: Never true  Transportation Needs: No Transportation Needs (10/13/2021)   PRAPARE - Hydrologist (Medical): No    Lack of Transportation (Non-Medical): No  Physical Activity: Unknown (10/13/2021)   Exercise Vital Sign    Days of Exercise per Week: 0 days    Minutes of Exercise per Session: Not on file  Stress: No Stress Concern Present (10/13/2021)   [place]    Feeling of Stress : Not at all  Social Connections: Unknown (10/13/2021)   Social Connection and Isolation Panel [NHANES]    Frequency of Communication with Friends and Family: Not on file    Frequency of Social Gatherings with Friends and Family: Not on file    Attends Religious Services: Not on file    Active Member of Clubs or Organizations: Not on file    Attends Archivist Meetings: Not on file    Marital Status: Married    Tobacco Counseling Counseling given: Not Answered   Clinical Intake:  Pre-visit preparation completed: Yes        Diabetes:  (Followed by PCP)  How often do you need to have someone help you when you read instructions, pamphlets, or other written materials from your doctor or pharmacy?: 3 - Sometimes   Interpreter Needed?: No      Activities of Daily Living    10/13/2021    1:42 PM  In your present state of health, do you have any difficulty performing the following  activities:  Hearing? 1  Comment Hearing aids  Vision? 0  Difficulty concentrating or making decisions? 1  Walking or climbing stairs? 0  Dressing or bathing? 0  Doing errands, shopping? 1  Comment [place] and eating ? Y  Comment Husband assist with meal prep. Self feeds.  Using the Toilet? N  In the past six months, have you accidently leaked urine? Y  Comment Followed by  Urology.  Do you have problems with loss of bowel control? N  Managing your Medications? Y  Comment Husband assist  Managing your Finances? Y  Comment Husband assist  Housekeeping or managing your Housekeeping? Y  Comment Husband assist    Patient Care Team: Crecencio Mc, MD as PCP - General (Internal Medicine)  Indicate any recent Medical Services you may have received from other than [facility] providers in the past year (date may be approximate).     Assessment:   This is a routine wellness examination for [place].  I connected with  Bjorn Loser on 10/13/21 by a audio enabled telemedicine application and verified that I am speaking with the correct person using two identifiers.Husband assist with some information, HIPAA compliant.   Patient Location: Home  Provider Location: Office/Clinic  I discussed the limitations of evaluation and management by telemedicine. The patient expressed understanding and agreed to proceed.   Hearing/Vision screen Hearing Screening - Comments:: Followed by [facility] Hearing aids Vision Screening - Comments:: Followed by [facility], Dr. George Ina Wears corrective lenses. They have seen their ophthalmologist in the last 12 months.   Dietary issues and exercise activities discussed: Current Exercise Habits: The patient has a physically strenuous job, but has no regular exercise apart from work.   Goals Addressed               This Visit's Progress     Patient Stated     Increase physical activity (pt-stated)        Stay active        Depression Screen    09/27/2021   11:15 AM 09/07/2021   10:38 AM 08/24/2020    2:17 PM 07/02/2020    9:04 AM 08/22/2019    2:16 PM 05/17/2018    3:58 PM 12/14/2015    4:19 PM  PHQ 2/9 Scores  PHQ - 2 Score 1 2  0 0 0 0  PHQ- 9 Score  9  6     Exception Documentation   Other- indicate reason in comment box      Not completed   Followed by psychiatrist every 3 months        Fall Risk    10/13/2021    1:42 PM 09/27/2021   11:15 AM 09/07/2021   10:38 AM 09/02/2020    4:30 PM 08/24/2020    2:20 PM  [place] in the past year? 1 1 1 1  0  Number falls in past yr:  1 0 0   Injury with Fall?  0 0 0 0  Risk for fall due to :  History of fall(s) No Fall Risks History of fall(s)   Follow up  Falls evaluation completed Falls evaluation completed Falls evaluation completed     [place]: Home free of loose throw rugs in walkways, pet beds, electrical cords, etc? Yes  Adequate lighting in your home to reduce risk of falls? Yes   ASSISTIVE DEVICES UTILIZED TO PREVENT FALLS: Life alert? No  Use of a cane, walker or w/c? No  Grab bars in the bathroom? No  Shower chair or bench in shower? No  Elevated toilet seat or a handicapped toilet? No   TIMED UP AND GO: Was the test performed? No .   Cognitive Function: Patient is alert.     09/24/2015    2:51 PM  MMSE - Mini Mental State Exam  Not completed: Unable to complete  10/13/2021    1:51 PM 08/22/2019    2:25 PM  6CIT Screen  What Year? 0 points 0 points  What month? 0 points 0 points  What time? 0 points 0 points  Months in reverse  0 points    Immunizations Immunization History  Administered Date(s) Administered   Fluad Quad(high Dose 65+) 10/16/2019   Hep A / Hep B 04/11/2011, 05/11/2011, 11/04/2011   Influenza-Unspecified 10/09/2015, 11/04/2017   PFIZER(Purple Top)SARS-COV-2 Vaccination 02/15/2019, 03/31/2019, 10/16/2019, 06/18/2020   PNEUMOCOCCAL CONJUGATE-20  06/18/2020   Pneumococcal Polysaccharide-23 08/27/2015   Tdap 09/09/2013   Shingrix Completed?: No.    Education has been provided regarding the importance of this vaccine. Patient has been advised to call insurance company to determine out of pocket expense if they have not yet received this vaccine. Advised may also receive vaccine at local pharmacy or Health Dept. Verbalized acceptance and understanding.  Screening Tests Health Maintenance  Topic Date Due   MAMMOGRAM  11/21/2018   COVID-19 Vaccine (5 - Pfizer series) 10/13/2021 (Originally 08/13/2020)   Zoster Vaccines- Shingrix (1 of 2) 12/03/2021 (Originally 03/10/2003)   INFLUENZA VACCINE  04/03/2022 (Originally 08/03/2021)   HEMOGLOBIN A1C  03/08/2022   OPHTHALMOLOGY EXAM  08/03/2022   Diabetic kidney evaluation - Urine ACR  09/08/2022   FOOT EXAM  09/08/2022   Diabetic kidney evaluation - GFR measurement  09/28/2022   COLONOSCOPY (Pts 45-35yrs Insurance coverage will need to be confirmed)  04/16/2023   TETANUS/TDAP  09/10/2023   Pneumonia Vaccine 54+ Years old  Completed   DEXA SCAN  Completed   Hepatitis C Screening  Completed   HPV VACCINES  Aged Out    Health Maintenance Health Maintenance Due  Topic Date Due   MAMMOGRAM  11/21/2018   CT CHEST W CONTRAST- scheduled 10/19/21.  Hepatitis C Screening: Completed 2010.  Vision Screening: Recommended annual ophthalmology exams for early detection of glaucoma and other disorders of the eye.  Dental Screening: Recommended annual dental exams for proper oral hygiene.  Community Resource Referral / Chronic Care Management: CRR required this visit?  No   CCM required this visit?  No      Plan:     I have personally reviewed and noted the following in the patient's chart:   Medical and social history Use of alcohol, tobacco or illicit drugs  Current medications and supplements including opioid prescriptions. Patient is not currently taking opioid  prescriptions. Functional ability and status Nutritional status Physical activity Advanced directives List of other physicians Hospitalizations, surgeries, and ER visits in previous 12 months Vitals Screenings to include cognitive, depression, and falls Referrals and appointments  In addition, I have reviewed and discussed with patient certain preventive protocols, quality metrics, and best practice recommendations. A written personalized care plan for preventive services as well as general preventive health recommendations were provided to patient.     OBrien-Blaney, Josph [place] L, LPN   [id number]    I have reviewed the above information and agree with above.   Deborra Medina, MD

## 2021-10-13 NOTE — Patient Instructions (Addendum)
[name] , Thank you for taking time to come for your Medicare Wellness Visit. I appreciate your ongoing commitment to your health goals. Please review the following plan we discussed and let me know if I can assist you in the future.   These are the goals we discussed:  Goals       Patient Stated     Increase physical activity (pt-stated)      Stay active        This is a list of the screening recommended for you and due dates:  Health Maintenance  Topic Date Due   Mammogram  11/21/2018   COVID-19 Vaccine (5 - Pfizer series) 10/13/2021*   Zoster (Shingles) Vaccine (1 of 2) 12/03/2021*   Flu Shot  04/03/2022*   Hemoglobin A1C  03/08/2022   Eye exam for diabetics  08/03/2022   Yearly kidney health urinalysis for diabetes  09/08/2022   Complete foot exam   09/08/2022   Yearly kidney function blood test for diabetes  09/28/2022   Colon Cancer Screening  04/16/2023   Tetanus Vaccine  09/10/2023   Pneumonia Vaccine  Completed   DEXA scan (bone density measurement)  Completed   Hepatitis C Screening: USPSTF Recommendation to screen - Ages 44-79 yo.  Completed   HPV Vaccine  Aged Out  *Topic was postponed. The date shown is not the original due date.   Advanced directives:  End of life planning; Advanced aging; Advanced directives discussed.  No HCPOA/Living Will.  Additional information declined at this time.  Next appointment: Follow up in one year for your annual wellness visit.  Preventive Care 65 Years and Older, Female Preventive care refers to lifestyle choices and visits with your health care provider that can promote health and wellness. What does preventive care include? A yearly physical exam. This is also called an annual well check. Dental exams once or twice a year. Routine eye exams. Ask your health care provider how often you should have your eyes checked. Personal lifestyle choices, including: Daily care of your teeth and gums. Regular physical  activity. Eating a healthy diet. Avoiding tobacco and drug use. Limiting alcohol use. Practicing safe sex. Taking low-dose aspirin every day. Taking vitamin and mineral supplements as recommended by your health care provider. What happens during an annual well check? The services and screenings done by your health care provider during your annual well check will depend on your age, overall health, lifestyle risk factors, and family history of disease. Counseling  Your health care provider may ask you questions about your: Alcohol use. Tobacco use. Drug use. Emotional well-being. Home and relationship well-being. Sexual activity. Eating habits. History of falls. Memory and ability to understand (cognition). Work and work Statistician. Reproductive health. Screening  You may have the following tests or measurements: Height, weight, and BMI. Blood pressure. Lipid and cholesterol levels. These may be checked every 5 years, or more frequently if you are over 46 years old. Skin check. Lung cancer screening. You may have this screening every year starting at age 52 if you have a 30-pack-year history of smoking and currently smoke or have quit within the past 15 years. Fecal occult blood test (FOBT) of the stool. You may have this test every year starting at age 40. Flexible sigmoidoscopy or colonoscopy. You may have a sigmoidoscopy every 5 years or a colonoscopy every 10 years starting at age 58. Hepatitis C blood test. Hepatitis B blood test. Sexually transmitted disease (STD) testing. Diabetes screening. This is  done by checking your blood sugar (glucose) after you have not eaten for a while (fasting). You may have this done every 1-3 years. Bone density scan. This is done to screen for osteoporosis. You may have this done starting at age 82. Mammogram. This may be done every 1-2 years. Talk to your health care provider about how often you should have regular mammograms. Talk with your  health care provider about your test results, treatment options, and if necessary, the need for more tests. Vaccines  Your health care provider may recommend certain vaccines, such as: Influenza vaccine. This is recommended every year. Tetanus, diphtheria, and acellular pertussis (Tdap, Td) vaccine. You may need a Td booster every 10 years. Zoster vaccine. You may need this after age 38. Pneumococcal 13-valent conjugate (PCV13) vaccine. One dose is recommended after age 29. Pneumococcal polysaccharide (PPSV23) vaccine. One dose is recommended after age 88. Talk to your health care provider about which screenings and vaccines you need and how often you need them. This information is not intended to replace advice given to you by your health care provider. Make sure you discuss any questions you have with your health care provider. Document Released: 01/16/2015 Document Revised: 09/09/2015 Document Reviewed: 10/21/2014 Elsevier Interactive Patient Education  2017 [place] Prevention in the Home Falls can cause injuries. They can happen to people of all ages. There are many things you can do to make your home safe and to help prevent falls. What can I do on the outside of my home? Regularly fix the edges of walkways and driveways and fix any cracks. Remove anything that might make you trip as you walk through a door, such as a raised step or threshold. Trim any bushes or trees on the path to your home. Use bright outdoor lighting. Clear any walking paths of anything that might make someone trip, such as rocks or tools. Regularly check to see if handrails are loose or broken. Make sure that both sides of any steps have handrails. Any raised decks and porches should have guardrails on the edges. Have any leaves, snow, or ice cleared regularly. Use sand or salt on walking paths during winter. Clean up any spills in your garage right away. This includes oil or grease spills. What can I  do in the bathroom? Use night lights. Install grab bars by the toilet and in the tub and shower. Do not use towel bars as grab bars. Use non-skid mats or decals in the tub or shower. If you need to sit down in the shower, use a plastic, non-slip stool. Keep the floor dry. Clean up any water that spills on the floor as soon as it happens. Remove soap buildup in the tub or shower regularly. Attach bath mats securely with double-sided non-slip rug tape. Do not have throw rugs and other things on the floor that can make you trip. What can I do in the bedroom? Use night lights. Make sure that you have a light by your bed that is easy to reach. Do not use any sheets or blankets that are too big for your bed. They should not hang down onto the floor. Have a firm chair that has side arms. You can use this for support while you get dressed. Do not have throw rugs and other things on the floor that can make you trip. What can I do in the kitchen? Clean up any spills right away. Avoid walking on wet floors. Keep items that you  use a lot in easy-to-reach places. If you need to reach something above you, use a strong step stool that has a grab bar. Keep electrical cords out of the way. Do not use floor polish or wax that makes floors slippery. If you must use wax, use non-skid floor wax. Do not have throw rugs and other things on the floor that can make you trip. What can I do with my stairs? Do not leave any items on the stairs. Make sure that there are handrails on both sides of the stairs and use them. Fix handrails that are broken or loose. Make sure that handrails are as long as the stairways. Check any carpeting to make sure that it is firmly attached to the stairs. Fix any carpet that is loose or worn. Avoid having throw rugs at the top or bottom of the stairs. If you do have throw rugs, attach them to the floor with carpet tape. Make sure that you have a light switch at the top of the stairs  and the bottom of the stairs. If you do not have them, ask someone to add them for you. What else can I do to help prevent falls? Wear shoes that: Do not have high heels. Have rubber bottoms. Are comfortable and fit you well. Are closed at the toe. Do not wear sandals. If you use a stepladder: Make sure that it is fully opened. Do not climb a closed stepladder. Make sure that both sides of the stepladder are locked into place. Ask someone to hold it for you, if possible. Clearly mark and make sure that you can see: Any grab bars or handrails. First and last steps. Where the edge of each step is. Use tools that help you move around (mobility aids) if they are needed. These include: Canes. Walkers. Scooters. Crutches. Turn on the lights when you go into a dark area. Replace any light bulbs as soon as they burn out. Set up your furniture so you have a clear path. Avoid moving your furniture around. If any of your floors are uneven, fix them. If there are any pets around you, be aware of where they are. Review your medicines with your doctor. Some medicines can make you feel dizzy. This can increase your chance of falling. Ask your doctor what other things that you can do to help prevent falls. This information is not intended to replace advice given to you by your health care provider. Make sure you discuss any questions you have with your health care provider. Document Released: 10/16/2008 Document Revised: 05/28/2015 Document Reviewed: 01/24/2014 Elsevier Interactive Patient Education  2017 [employer].

## 2021-10-19 ENCOUNTER — Ambulatory Visit: Payer: Medicare Other

## 2021-10-22 ENCOUNTER — Encounter: Payer: Self-pay | Admitting: Internal Medicine

## 2021-10-22 ENCOUNTER — Ambulatory Visit (INDEPENDENT_AMBULATORY_CARE_PROVIDER_SITE_OTHER): Payer: Medicare Other | Admitting: Internal Medicine

## 2021-10-22 VITALS — BP 114/60 | HR 86 | Temp 98.4°F | Resp 14 | Ht 65.0 in | Wt 274.8 lb

## 2021-10-22 DIAGNOSIS — H01113 Allergic dermatitis of right eye, unspecified eyelid: Secondary | ICD-10-CM | POA: Insufficient documentation

## 2021-10-22 DIAGNOSIS — H01116 Allergic dermatitis of left eye, unspecified eyelid: Secondary | ICD-10-CM

## 2021-10-22 DIAGNOSIS — E559 Vitamin D deficiency, unspecified: Secondary | ICD-10-CM

## 2021-10-22 MED ORDER — TACROLIMUS 0.1 % EX OINT: TOPICAL_OINTMENT | Freq: Two times a day (BID) | CUTANEOUS | 0 refills | Status: DC

## 2021-10-22 NOTE — Assessment & Plan Note (Signed)
No change after 3 weeks of triamcinolone ointment.  May be reaction to the Cetaphil,  The eye wash or the lacrimal ducts.  Patient advised to stop using cetpahil,  Use only vaseline,  contine triamcinolone OR SWITCH TO TACROLIMUS 0.1% OINTMENT bid .  rtc 2 weeks

## 2021-10-22 NOTE — Patient Instructions (Signed)
Stop using cetaphil on the face  Use only vaseline (petroleum)   Stop the steroid ointment and use the tacrolimus instead  twice daily  Follow up in 2 weeks

## 2021-10-22 NOTE — Progress Notes (Unsigned)
Subjective:  Patient ID: [name], female    DOB: [birth year]  Age: 66 y.o. MRN: [id number]  CC: There were no encounter diagnoses.   HPI [name] presents for recurrent dermatitis affecting the skin around the eyes.  Has now become a chronic issue  first reported in May Chief Complaint  Patient presents with   Follow-up    Eyes, pt states rx helped some, eyes still the same clears up at times but comes back. Eyes get teary.   Facial rash:  referred to dermatology after last visit after failing to respond to antibiotics,  followed by prednisone    Hx of rash:  her orbital rash   started  In May after she had her eyebrows threaded.  he also recalls having lacrimal duct plugs done by Dr Druscilla Brownie so she visit hi in June but was told to see a dermatologist.  She ueied treating at home with a commercial product containing manuka honey ,  cold compresses..  did not improve was initially treated by urgent  care on July 28   with Septra and mupirocin,  developed a rash to septra and worsening erythema of eyelids to mupiricin .[name] Returned to [facility] on August 22   received doxycycline  /prednisone which helped but did not resolve rash .    Saw PCP (me) on Sept 5.  , doxy/prednisone repeated,  no improvement.  cipro eye drops for 5 days  for concern of conjunctivitis.  No change  was also using Cetaphil for sensitive skin,  followed by an eye  wash,   hot /cool compresses. Urgent referral to [facility] was made on Sept 5 but she has been given  FEBRUARY APPT .!   not been seen yet. At last on Sept 25  visit her eyelids had a purplish hue ,  were swollen ,scaling and itchy,  and she was noted to have a  macular annular rash on chin.    She was given triamcinolone topical steroid and advised to use zyrtec qid for itching.  Screening serologies for autoimmune cause s and dermatomyositis  Eyelids  and eyebrows continue to have an erythematous scaling rash       Outpatient Medications Prior to Visit   Medication Sig Dispense Refill   albuterol (VENTOLIN HFA) 108 (90 Base) MCG/ACT inhaler Inhale 2 puffs into the lungs every 6 (six) hours as needed for wheezing or shortness of breath. 1 each 2   amphetamine-dextroamphetamine (ADDERALL) 20 MG tablet Take 20 mg by mouth daily.     clonazePAM (KLONOPIN) 0.5 MG tablet TAKE 1/2 OF A TABLET (0.25 MG TOTAL) BY MOUTH TWICE A DAY 30 tablet 2   DULoxetine (CYMBALTA) 60 MG capsule Take 60 mg by mouth daily.      gabapentin (NEURONTIN) 600 MG tablet Take 600 mg by mouth 2 (two) times daily.     levothyroxine (SYNTHROID) 88 MCG tablet TAKE 1 TABLET BY MOUTH  DAILY 90 tablet 3   liothyronine (CYTOMEL) 25 MCG tablet Take 25 mcg by mouth daily. Takes 1/2 tablet BID     melatonin 5 MG TABS Take 5 mg by mouth at bedtime.     metFORMIN (GLUCOPHAGE-XR) 500 MG 24 hr tablet TAKE 1 TABLET BY MOUTH EVERY DAY WITH BREAKFAST 90 tablet 1   metoprolol succinate (TOPROL-XL) 25 MG 24 hr tablet TAKE 1 TABLET BY MOUTH  DAILY 90 tablet 3   mirabegron ER (MYRBETRIQ) 50 MG TB24 tablet Take 50 mg by mouth daily.  ondansetron (ZOFRAN) 4 MG tablet Take 1 tablet (4 mg total) by mouth every 8 (eight) hours as needed for nausea or vomiting. 40 tablet 0   QUEtiapine (SEROQUEL) 300 MG tablet Take 300 mg by mouth at bedtime.     ropinirole (REQUIP) 5 MG tablet Take 5 mg by mouth as needed.      tizanidine (ZANAFLEX) 6 MG capsule TAKE 1 CAPSULE BY MOUTH THREE TIMES A DAY AS NEEDED FOR MUSCLE SPASMS 60 capsule 2   erythromycin ophthalmic ointment Place a 1/2 inch ribbon of ointment into the lower eyelid. (Patient not taking: Reported on 10/13/2021) 3.5 g 0   triamcinolone cream (KENALOG) 0.1 % Apply 1 Application topically 2 (two) times daily. To eyelids (Patient not taking: Reported on 10/13/2021) 30 g 0   No facility-administered medications prior to visit.    Review of Systems;  Patient denies headache, fevers, malaise, unintentional weight loss, skin rash, eye pain, sinus  congestion and sinus pain, sore throat, dysphagia,  hemoptysis , cough, dyspnea, wheezing, chest pain, palpitations, orthopnea, edema, abdominal pain, nausea, melena, diarrhea, constipation, flank pain, dysuria, hematuria, urinary  Frequency, nocturia, numbness, tingling, seizures,  Focal weakness, Loss of consciousness,  Tremor, insomnia, depression, anxiety, and suicidal ideation.      Objective:  BP 114/60 (BP Location: Left Arm, Patient Position: Sitting, Cuff Size: Large)   Pulse 86   Temp 98.4 F (36.9 C) (Oral)   Resp 14   Ht 5\' 5"  (1.651 m)   Wt 274 lb 12.8 oz (124.6 kg)   SpO2 96%   BMI 45.73 kg/m   BP Readings from Last 3 Encounters:  10/22/21 114/60  09/27/21 132/84  09/25/21 122/83    Wt Readings from Last 3 Encounters:  10/22/21 274 lb 12.8 oz (124.6 kg)  10/13/21 275 lb (124.7 kg)  09/27/21 275 lb 9.6 oz (125 kg)    General appearance: alert, cooperative and appears stated age Ears: normal TM's and external ear canals both ears Throat: lips, mucosa, and tongue normal; teeth and gums normal Neck: no adenopathy, no carotid bruit, supple, symmetrical, trachea midline and thyroid not enlarged, symmetric, no tenderness/mass/nodules Back: symmetric, no curvature. ROM normal. No CVA tenderness. Lungs: clear to auscultation bilaterally Heart: regular rate and rhythm, S1, S2 normal, no murmur, click, rub or gallop Abdomen: soft, non-tender; bowel sounds normal; no masses,  no organomegaly Pulses: 2+ and symmetric Skin: Skin color, texture, turgor normal. No rashes or lesions Lymph nodes: Cervical, supraclavicular, and axillary nodes normal. Neuro:  awake and interactive with normal mood and affect. Higher cortical functions are normal. Speech is clear without word-finding difficulty or dysarthria. Extraocular movements are intact. Visual fields of both eyes are grossly intact. Sensation to light touch is grossly intact bilaterally of upper and lower extremities. Motor  examination shows 4+/5 symmetric hand grip and upper extremity and 5/5 lower extremity strength. There is no pronation or drift. Gait is non-ataxic   Lab Results  Component Value Date   HGBA1C 6.8 (A) 09/07/2021   HGBA1C 6.3 02/26/2020   HGBA1C 6.1 10/05/2018    Lab Results  Component Value Date   CREATININE 0.91 09/27/2021   CREATININE 1.00 09/07/2021   CREATININE 0.99 04/07/2020    Lab Results  Component Value Date   WBC 5.8 09/27/2021   HGB 12.8 09/27/2021   HCT 38.5 09/27/2021   PLT 227.0 09/27/2021   GLUCOSE 294 (H) 09/27/2021   CHOL 203 (H) 09/07/2021   TRIG 101.0 09/07/2021   HDL 70.70 09/07/2021  LDLDIRECT 120.0 09/07/2021   LDLCALC 112 (H) 09/07/2021   ALT 30 09/27/2021   AST 19 09/27/2021   NA 139 09/27/2021   K 4.0 09/27/2021   CL 101 09/27/2021   CREATININE 0.91 09/27/2021   BUN 17 09/27/2021   CO2 28 09/27/2021   TSH 6.71 (H) 09/07/2021   HGBA1C 6.8 (A) 09/07/2021   MICROALBUR 0.9 09/07/2021    No results found.  Assessment & Plan:   Problem List Items Addressed This Visit   None   I spent a total of   minutes with this patient in a face to face visit on the date of this encounter reviewing the last office visit with me in       ,  most recent visit with cardiology ,    ,  patient's diet and exercise habits, home blood pressure /blod sugar readings, recent ER visit including labs and imaging studies ,   and post visit ordering of testing and therapeutics.    Follow-up: No follow-ups on file.   Crecencio Mc, MD

## 2021-11-03 ENCOUNTER — Ambulatory Visit
Admission: RE | Admit: 2021-11-03 | Discharge: 2021-11-03 | Disposition: A | Discharge status: Home / Self Care | Payer: Medicare Other | Source: Ambulatory Visit | Attending: Internal Medicine | Admitting: Internal Medicine

## 2021-11-03 DIAGNOSIS — R918 Other nonspecific abnormal finding of lung field: Secondary | ICD-10-CM | POA: Diagnosis not present

## 2021-11-03 LAB — POCT I-STAT CREATININE: Creatinine, Ser: 0.8 mg/dL (ref 0.44–1.00)

## 2021-11-03 MED ORDER — IOHEXOL 300 MG/ML  SOLN
75.0000 mL | Freq: Once | INTRAMUSCULAR | Status: AC | PRN
  Administered 2021-11-03: 75 mL via INTRAVENOUS

## 2021-11-05 ENCOUNTER — Telehealth (INDEPENDENT_AMBULATORY_CARE_PROVIDER_SITE_OTHER): Payer: Medicare Other | Admitting: Internal Medicine

## 2021-11-05 ENCOUNTER — Encounter: Payer: Self-pay | Admitting: Internal Medicine

## 2021-11-05 VITALS — Ht 65.0 in | Wt 274.8 lb

## 2021-11-05 DIAGNOSIS — H01113 Allergic dermatitis of right eye, unspecified eyelid: Secondary | ICD-10-CM

## 2021-11-05 DIAGNOSIS — H01116 Allergic dermatitis of left eye, unspecified eyelid: Secondary | ICD-10-CM | POA: Diagnosis not present

## 2021-11-05 MED ORDER — MUPIROCIN 2 % EX OINT: 1.0000 | TOPICAL_OINTMENT | Freq: Two times a day (BID) | CUTANEOUS | 0 refills | Status: AC

## 2021-11-05 NOTE — Progress Notes (Signed)
Virtual Visit converted to telephone  Note    This format is felt to be most appropriate for this patient at this time.  All issues noted in this document were discussed and addressed.  No physical exam was performed (except for noted visual exam findings with Video Visits).   I connected with [name] on 11/05/21 at  1:00 PM EDT by a video enabled telemedicine application and  verified that I am speaking with the correct person using two identifiers. Location patient: home Location provider: work or home office Persons participating in the virtual visit: patient, provider  I discussed the limitations, risks, security and privacy concerns of performing an evaluation and management service by telephone and the availability of in person appointments. I also discussed with the patient that there may be a patient responsible charge related to this service. The patient expressed understanding and agreed to proceed.  Interactive audio and video telecommunications were attempted between this provider and patient, however failed, due to patient having technical difficulties   We continued and completed visit with audio only.   Reason for visit: follow  up on eyelid rash   HPI:  At last visit I prescribed tacrolimus topical for allergic contact dermatitis of both eyelids and recommending stopping use of all other topical products except vaseline.  The tacrolimus was not covered by insurance:  she saw  her ophthalmologist Dr George Ina last week,  who recommended  one month treatment with desonide cream  and eyelid wipes containing tea tree oil and coconut oil  for treatment of mites (which he reportedly confirmed )  , along with budesonide  cream  twice daily  .  The rash on the eyelids has improved slightly,  but she continues to have cloudy discharge from her eye,  and her eyebrows remain broken out.    ROS: See pertinent positives and negatives per HPI.  Past Medical History:  Diagnosis Date    Anxiety    Arthritis    Atopic dermatitis    scalp   COVID    2020 and 2021   Depression    Hyperlipidemia    Hypertension    pt denies   Thyroid disease    Tobacco abuse     Past Surgical History:  Procedure Laterality Date   LUMBAR DISC SURGERY     ROTATOR CUFF REPAIR Bilateral     Family History  Problem Relation Age of Onset   Arthritis Mother    Macular degeneration Mother    Neuropathy Mother    Heart disease Father    Breast cancer Neg Hx     SOCIAL HX: OBJECTIVE: reports that she quit smoking about 5 years ago. Her smoking use included cigarettes. She smoked an average of .25 packs per day. She has never used smokeless tobacco. She reports current alcohol use. She reports that she does not use drugs.    Current Outpatient Medications:    albuterol (VENTOLIN HFA) 108 (90 Base) MCG/ACT inhaler, Inhale 2 puffs into the lungs every 6 (six) hours as needed for wheezing or shortness of breath., Disp: 1 each, Rfl: 2   amphetamine-dextroamphetamine (ADDERALL) 20 MG tablet, Take 20 mg by mouth daily., Disp: , Rfl:    clonazePAM (KLONOPIN) 0.5 MG tablet, TAKE 1/2 OF A TABLET (0.25 MG TOTAL) BY MOUTH TWICE A DAY, Disp: 30 tablet, Rfl: 2   desonide (DESOWEN) 0.05 % cream, Apply 1 Application topically 2 (two) times daily., Disp: , Rfl:    DULoxetine (CYMBALTA) 60  MG capsule, Take 60 mg by mouth daily. , Disp: , Rfl:    gabapentin (NEURONTIN) 600 MG tablet, Take 600 mg by mouth 2 (two) times daily., Disp: , Rfl:    levothyroxine (SYNTHROID) 75 MCG tablet, Take 75 mcg by mouth daily., Disp: , Rfl:    liothyronine (CYTOMEL) 25 MCG tablet, Take 25 mcg by mouth daily. Takes 1/2 tablet BID, Disp: , Rfl:    melatonin 5 MG TABS, Take 5 mg by mouth at bedtime., Disp: , Rfl:    metFORMIN (GLUCOPHAGE-XR) 500 MG 24 hr tablet, TAKE 1 TABLET BY MOUTH EVERY DAY WITH BREAKFAST, Disp: 90 tablet, Rfl: 1   metoprolol succinate (TOPROL-XL) 25 MG 24 hr tablet, TAKE 1 TABLET BY MOUTH  DAILY, Disp:  90 tablet, Rfl: 3   mirabegron ER (MYRBETRIQ) 50 MG TB24 tablet, Take 50 mg by mouth daily., Disp: , Rfl:    mupirocin ointment (BACTROBAN) 2 %, Apply 1 Application topically 2 (two) times daily. To eyebrows, Disp: 22 g, Rfl: 0   ondansetron (ZOFRAN) 4 MG tablet, Take 1 tablet (4 mg total) by mouth every 8 (eight) hours as needed for nausea or vomiting., Disp: 40 tablet, Rfl: 0   QUEtiapine (SEROQUEL) 300 MG tablet, Take 300 mg by mouth at bedtime., Disp: , Rfl:    ropinirole (REQUIP) 5 MG tablet, Take 5 mg by mouth as needed. , Disp: , Rfl:    tizanidine (ZANAFLEX) 6 MG capsule, TAKE 1 CAPSULE BY MOUTH THREE TIMES A DAY AS NEEDED FOR MUSCLE SPASMS, Disp: 60 capsule, Rfl: 2  EXAM:   General impression: alert, cooperative and articulate.  No signs of being in distress  Lungs: speech is fluent sentence length suggests that patient is not short of breath and not punctuated by cough, sneezing or sniffing. [name]   Psych: affect normal.  speech is articulate and non pressured .  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  Her appt with Dermatology (Dr Erin Fulling) is not until February.  Adding mupirocin for eyebrows,  and will reach out to dermatology to request an earlier appt     I discussed the assessment and treatment plan with the patient. The patient was provided an opportunity to ask questions and all were answered. The patient agreed with the plan and demonstrated an understanding of the instructions.   The patient was advised to call back or seek an in-person evaluation if the symptoms worsen or if the condition fails to  improve as anticipated.   I spent 20 minutes dedicated to the care of this patient on the date of this encounter to include pre-visit review of his medical history,  non  Face-to-face time with the patient , and post visit ordering of testing and therapeutics.    Crecencio Mc, MD

## 2021-11-05 NOTE — Assessment & Plan Note (Signed)
Her eyelid rash has improved slightly (by report) with treatment of mite infestation (confirmed by Dr George Ina) with teatree oil/coconut oil eye wipes and budesonide ,  but eyebrows remain broken out and irritated.  Her appt with Dermatology (Dr Erin Fulling) is not until February.  Adding mupirocin for eyebrows,  and will reach out to dermatology to request an earlier appt

## 2021-11-06 ENCOUNTER — Encounter: Payer: Self-pay | Admitting: Internal Medicine

## 2021-11-06 DIAGNOSIS — I700 Atherosclerosis of aorta: Secondary | ICD-10-CM | POA: Insufficient documentation

## 2021-11-22 ENCOUNTER — Telehealth: Payer: Self-pay | Admitting: Internal Medicine

## 2021-11-22 DIAGNOSIS — H01113 Allergic dermatitis of right eye, unspecified eyelid: Secondary | ICD-10-CM

## 2021-11-22 DIAGNOSIS — M3390 Dermatopolymyositis, unspecified, organ involvement unspecified: Secondary | ICD-10-CM

## 2021-11-22 NOTE — Telephone Encounter (Signed)
Patient returned office phone call and note was read. 

## 2021-11-22 NOTE — Telephone Encounter (Signed)
Referral has been signed.

## 2021-11-22 NOTE — Telephone Encounter (Signed)
LMTCB. Need to let pt know that referral has been placed.

## 2021-11-22 NOTE — Telephone Encounter (Signed)
noted 

## 2021-11-22 NOTE — Telephone Encounter (Signed)
Pt spouse called stating pt skin condition is getting worse. Pt spouse would like to get an urgent referral for [facility] dermatology in [place] and they will work her in once they have the urgent referral

## 2021-11-22 NOTE — Telephone Encounter (Signed)
I have pended the referral for approval.  

## 2022-01-05 ENCOUNTER — Ambulatory Visit: Payer: Medicare Other

## 2022-01-06 ENCOUNTER — Ambulatory Visit
Admission: RE | Admit: 2022-01-06 | Discharge: 2022-01-06 | Disposition: A | Discharge status: Home / Self Care | Payer: BC Managed Care – PPO | Source: Ambulatory Visit | Attending: Emergency Medicine | Admitting: Emergency Medicine

## 2022-01-06 VITALS — BP 134/80 | HR 111 | Temp 97.8°F | Resp 18

## 2022-01-06 DIAGNOSIS — L282 Other prurigo: Secondary | ICD-10-CM | POA: Diagnosis not present

## 2022-01-06 MED ORDER — HYDROXYZINE HCL 25 MG PO TABS: 25.0000 mg | ORAL_TABLET | Freq: Four times a day (QID) | ORAL | 0 refills | Status: AC

## 2022-01-06 NOTE — ED Triage Notes (Addendum)
Patient to Urgent Care with complaints of bilateral eye itching and drainage. States when her eyes water they burn her skin. Also with new rash on chin and bilateral forearms.    Reports issue has been going on for over six months. Saw a dermatologist  and was prescribed 2 doses of ivermectin 11/23. Saw some improvement in symptoms. Reports two weeks ago symptoms worsened and reports eyes are worse than they've been. Reports she was told she has mites.   Has been applying Vaseline which has helped loosen some of the crust in order to open her eyes. Cold compresses.

## 2022-01-06 NOTE — ED Provider Notes (Signed)
[facility]    CSN: [id number] Arrival date & time: 01/06/22  1618      History   Chief Complaint Chief Complaint  Patient presents with   Conjunctivitis    Discharge that gets itchy - Entered by patient    HPI [name] is a 67 y.o. female.  Accompanied by her husband, patient presents with pruritic rash on her face and body x 6 months.  Patient has been seen for this multiple times and has seen dermatologist.  No new products or medications.  Treatment at home with Vaseline.  Patient was seen by dermatologist on 11/23/2021; diagnosed with dermatitis and pruritus; treated with triamcinolone cream, tacrolimus, ivermectin.    The history is provided by the patient, the spouse and medical records.    Past Medical History:  Diagnosis Date   Anxiety    Arthritis    Atopic dermatitis    scalp   COVID    2020 and 2021   Depression    Hyperlipidemia    Hypertension    pt denies   Thyroid disease    Tobacco abuse     Patient Active Problem List   Diagnosis Date Noted   Thoracic aortic atherosclerosis ([place]) 11/06/2021   Allergic contact dermatitis of eyelids of both eyes 10/22/2021   Lung infiltrate 09/28/2021   Heliotrope rash of eyelid ([place]) 09/27/2021   Facial rash 09/07/2021   COVID-19 long hauler manifesting chronic fatigue 07/04/2020   Elevated liver enzymes 04/01/2020   Suspected COVID-19 virus infection 05/28/2018   PSVT (paroxysmal supraventricular tachycardia) 05/11/2017   OSA (obstructive sleep apnea) 10/17/2016   Fatigue 10/03/2016   Pulmonary nodule less than 6 cm determined by computed tomography of lung 01/13/2016   Overactive bladder 12/15/2015   Type 2 diabetes mellitus with other specified complication ([place]) [id number]   Bipolar I disorder, most recent episode depressed ([place]) 06/04/2014   Hypovitaminosis D 12/12/2013   Encounter for preventive health examination 12/09/2013   Constipation 04/22/2013   Lumbago 08/17/2012   History of  acute pancreatitis 07/11/2012   Colon polyp, hyperplastic 04/30/2012   B12 deficiency 04/11/2011   Fatty liver disease, nonalcoholic [id number]   Obesity, Class III, BMI 40-49.9 (morbid obesity) ([place]) 04/11/2011   Tobacco abuse    Hypothyroidism due to acquired atrophy of thyroid    Hyperlipidemia    Arthritis    Atopic dermatitis    Anxiety     Past Surgical History:  Procedure Laterality Date   LUMBAR DISC SURGERY     ROTATOR CUFF REPAIR Bilateral     OB History   No obstetric history on file.      Home Medications    Prior to Admission medications   Medication Sig Start Date End Date Taking? Authorizing Provider  hydrOXYzine (ATARAX) 25 MG tablet Take 1 tablet (25 mg total) by mouth every 6 (six) hours. 01/06/22  Yes Sharion Balloon, NP  albuterol (VENTOLIN HFA) 108 (90 Base) MCG/ACT inhaler Inhale 2 puffs into the lungs every 6 (six) hours as needed for wheezing or shortness of breath. 07/02/20   Crecencio Mc, MD  amphetamine-dextroamphetamine (ADDERALL) 20 MG tablet Take 20 mg by mouth daily. 08/25/21   [provider]  clonazePAM (KLONOPIN) 0.5 MG tablet TAKE 1/2 OF A TABLET (0.25 MG TOTAL) BY MOUTH TWICE A DAY 11/13/20   Crecencio Mc, MD  desonide (DESOWEN) 0.05 % cream Apply 1 Application topically 2 (two) times daily. 10/25/21   [provider]  DULoxetine (  CYMBALTA) 60 MG capsule Take 60 mg by mouth daily.  07/05/18   [provider]  gabapentin (NEURONTIN) 600 MG tablet Take 600 mg by mouth 2 (two) times daily. 08/24/21   [provider]  levothyroxine (SYNTHROID) 75 MCG tablet Take 75 mcg by mouth daily. 11/01/21   [provider]  liothyronine (CYTOMEL) 25 MCG tablet Take 25 mcg by mouth daily. Takes 1/2 tablet BID 09/02/21   [provider]  melatonin 5 MG TABS Take 5 mg by mouth at bedtime.    [provider]  metFORMIN (GLUCOPHAGE-XR) 500 MG 24 hr tablet TAKE 1 TABLET BY MOUTH EVERY DAY WITH BREAKFAST  08/22/21   Dutch Quint B, FNP  metoprolol succinate (TOPROL-XL) 25 MG 24 hr tablet TAKE 1 TABLET BY MOUTH  DAILY 06/22/20   Crecencio Mc, MD  mirabegron ER (MYRBETRIQ) 50 MG TB24 tablet Take 50 mg by mouth daily.    [provider]  mupirocin ointment (BACTROBAN) 2 % Apply 1 Application topically 2 (two) times daily. To eyebrows 11/05/21   Crecencio Mc, MD  ondansetron (ZOFRAN) 4 MG tablet Take 1 tablet (4 mg total) by mouth every 8 (eight) hours as needed for nausea or vomiting. 03/19/20   McLean-Scocuzza, Nino Glow, MD  QUEtiapine (SEROQUEL) 300 MG tablet Take 300 mg by mouth at bedtime. 06/30/21   [provider]  ropinirole (REQUIP) 5 MG tablet Take 5 mg by mouth as needed.  09/13/18   [provider]  tizanidine (ZANAFLEX) 6 MG capsule TAKE 1 CAPSULE BY MOUTH THREE TIMES A DAY AS NEEDED FOR MUSCLE SPASMS 09/02/16   Crecencio Mc, MD    Family History Family History  Problem Relation Age of Onset   Arthritis Mother    Macular degeneration Mother    Neuropathy Mother    Heart disease Father    Breast cancer Neg Hx     Social History Social History   Tobacco Use   Smoking status: Former    Packs/day: 0.25    Types: Cigarettes    Quit date: 01/02/2016    Years since quitting: 6.0   Smokeless tobacco: Never  Substance Use Topics   Alcohol use: Yes    Comment: once a week    Drug use: No     Allergies   Ancef [cefazolin sodium], Penicillins, Clarithromycin, Doxycycline, Eggs or egg-derived products, Seroquel [quetiapine fumarate], Sulfamethoxazole-trimethoprim, and Other   Review of Systems Review of Systems  Constitutional:  Negative for chills and fever.  Skin:  Positive for rash. Negative for color change.  All other systems reviewed and are negative.    Physical Exam Triage Vital Signs ED Triage Vitals  Enc Vitals Group     BP      Pulse      Resp      Temp      Temp src      SpO2      Weight      Height      Head  Circumference      Peak Flow      Pain Score      Pain Loc      Pain Edu?      Excl. in [place]?    No data found.  Updated Vital Signs BP 134/80   Pulse (!) 111   Temp 97.8 F (36.6 C)   Resp 18   SpO2 96%   Visual Acuity Right Eye Distance:   Left Eye Distance:  Bilateral Distance:    Right Eye Near:   Left Eye Near:    Bilateral Near:     Physical Exam Vitals and nursing note reviewed.  Constitutional:      General: She is not in acute distress.    Appearance: She is well-developed. She is obese. She is not ill-appearing.  HENT:     Mouth/Throat:     Mouth: Mucous membranes are moist.  Cardiovascular:     Rate and Rhythm: Normal rate and regular rhythm.  Pulmonary:     Effort: Pulmonary effort is normal. No respiratory distress.  Musculoskeletal:     Cervical back: Neck supple.  Skin:    General: Skin is warm and dry.     Findings: Rash present.     Comments: Erythematous rash around eyes with numerous scabbed lesions on body.  Neurological:     Mental Status: She is alert.      UC Treatments / Results  Labs (all labs ordered are listed, but only abnormal results are displayed) Labs Reviewed - No data to display  EKG   Radiology No results found.  Procedures Procedures (including critical care time)  Medications Ordered in UC Medications - No data to display  Initial Impression / Assessment and Plan / UC Course  I have reviewed the triage vital signs and the nursing notes.  Pertinent labs & imaging results that were available during my care of the patient were reviewed by me and considered in my medical decision making (see chart for details).    Pruritic rash.  Discussed with patient that she needs to follow-up with her dermatologist.  Treating pruritus with hydroxyzine; precautions for drowsiness with this medication discussed.  Education provided on rash.  Patient agrees to plan of care.  Final Clinical Impressions(s) / UC Diagnoses    Final diagnoses:  Pruritic rash     Discharge Instructions      Take the hydroxyzine as directed for itching.  Do not drive, operate machinery, drink alcohol, or perform dangerous activities while taking this medication as it may cause drowsiness.  Follow up with your dermatologist.         ED Prescriptions     Medication Sig Dispense Auth. Provider   hydrOXYzine (ATARAX) 25 MG tablet Take 1 tablet (25 mg total) by mouth every 6 (six) hours. 12 tablet Sharion Balloon, NP      PDMP not reviewed this encounter.   Sharion Balloon, NP 01/06/22 [phone]

## 2022-01-06 NOTE — Discharge Instructions (Addendum)
Take the hydroxyzine as directed for itching.  Do not drive, operate machinery, drink alcohol, or perform dangerous activities while taking this medication as it may cause drowsiness.  Follow up with your dermatologist.

## 2022-01-20 ENCOUNTER — Other Ambulatory Visit: Payer: Self-pay | Admitting: Family

## 2022-02-23 ENCOUNTER — Ambulatory Visit: Payer: Medicare Other | Admitting: Dermatology

## 2022-08-25 ENCOUNTER — Emergency Department (HOSPITAL_COMMUNITY): Payer: Medicare Other

## 2022-08-25 ENCOUNTER — Encounter (HOSPITAL_COMMUNITY): Payer: Self-pay

## 2022-08-25 ENCOUNTER — Other Ambulatory Visit: Payer: Self-pay

## 2022-08-25 ENCOUNTER — Emergency Department (HOSPITAL_COMMUNITY)
Admission: EM | Admit: 2022-08-25 | Discharge: 2022-08-25 | Discharge status: Against Medical Advice | Payer: Medicare Other | Attending: Emergency Medicine | Admitting: Emergency Medicine

## 2022-08-25 DIAGNOSIS — R4182 Altered mental status, unspecified: Secondary | ICD-10-CM | POA: Insufficient documentation

## 2022-08-25 DIAGNOSIS — Z7984 Long term (current) use of oral hypoglycemic drugs: Secondary | ICD-10-CM | POA: Insufficient documentation

## 2022-08-25 DIAGNOSIS — Z79899 Other long term (current) drug therapy: Secondary | ICD-10-CM | POA: Diagnosis not present

## 2022-08-25 DIAGNOSIS — R519 Headache, unspecified: Secondary | ICD-10-CM | POA: Insufficient documentation

## 2022-08-25 LAB — BASIC METABOLIC PANEL
Anion gap: 6 (ref 5–15)
BUN: 20 mg/dL (ref 8–23)
CO2: 29 mmol/L (ref 22–32)
Calcium: 9.4 mg/dL (ref 8.9–10.3)
Chloride: 104 mmol/L (ref 98–111)
Creatinine, Ser: 0.94 mg/dL (ref 0.44–1.00)
GFR, Estimated: >60 mL/min (ref >60)
Glucose, Bld: 97 mg/dL (ref 70–99)
Potassium: 4.4 mmol/L (ref 3.5–5.1)
Sodium: 139 mmol/L (ref 135–145)

## 2022-08-25 LAB — CBC
HCT: 40.8 % (ref 36.0–46.0)
Hemoglobin: 12.6 g/dL (ref 12.0–15.0)
MCH: 29.4 pg (ref 26.0–34.0)
MCHC: 30.9 g/dL (ref 30.0–36.0)
MCV: 95.3 fL (ref 80.0–100.0)
Platelets: 268 10*3/uL (ref 150–400)
RBC: 4.28 MIL/uL (ref 3.87–5.11)
RDW: 13.7 % (ref 11.5–15.5)
WBC: 6.8 10*3/uL (ref 4.0–10.5)
nRBC: 0 % (ref 0.0–0.2)

## 2022-08-25 MED ORDER — OXYCODONE-ACETAMINOPHEN 5-325 MG PO TABS
2.0000 | ORAL_TABLET | Freq: Once | ORAL | Status: AC
  Administered 2022-08-25: 2 via ORAL
  Filled 2022-08-25: qty 2

## 2022-08-25 NOTE — ED Triage Notes (Signed)
Patient said she has been increasingly becoming more altered over the past 3 weeks but more over the past 2 days. Feeling more sleepy than normal, no energy, left sided arm and facial numbness began 2 days ago.

## 2022-08-25 NOTE — Discharge Instructions (Addendum)
You were evaluated with ct scan and labs.  CT was normal  Please continue tylenol and ibuprofen  Return if you are having new or worsening symptoms Please follow-up with your doctor this week

## 2022-08-25 NOTE — ED Provider Notes (Signed)
[place] EMERGENCY DEPARTMENT AT [facility] Provider Note   CSN: [id number] Arrival date & time: 08/25/22  1306     History  Chief Complaint  Patient presents with   Altered Mental Status    Chyenne Egy is a 67 y.o. female.  HPI 67 year old female history of bipolar disorder presents today complaining of headache for the past 2 weeks.  Onset was gradual.  She states that she been having pain after a dental extraction.  She has not noted any swelling, discharge, fever, chills.  Vision is normal for her.  No eye pain is noted.  No ear pain is noted.  No fever or chills.  She does not normally have headaches.    Home Medications Prior to Admission medications   Medication Sig Start Date End Date Taking? Authorizing Provider  albuterol (VENTOLIN HFA) 108 (90 Base) MCG/ACT inhaler Inhale 2 puffs into the lungs every 6 (six) hours as needed for wheezing or shortness of breath. 07/02/20   Sherlene Shams, MD  amphetamine-dextroamphetamine (ADDERALL) 20 MG tablet Take 20 mg by mouth daily. 08/25/21   [provider]  clonazePAM (KLONOPIN) 0.5 MG tablet TAKE 1/2 OF A TABLET (0.25 MG TOTAL) BY MOUTH TWICE A DAY 11/13/20   Sherlene Shams, MD  desonide (DESOWEN) 0.05 % cream Apply 1 Application topically 2 (two) times daily. 10/25/21   [provider]  DULoxetine (CYMBALTA) 60 MG capsule Take 60 mg by mouth daily.  07/05/18   [provider]  gabapentin (NEURONTIN) 600 MG tablet Take 600 mg by mouth 2 (two) times daily. 08/24/21   [provider]  hydrOXYzine (ATARAX) 25 MG tablet Take 1 tablet (25 mg total) by mouth every 6 (six) hours. 01/06/22   Mickie Bail, NP  levothyroxine (SYNTHROID) 75 MCG tablet Take 75 mcg by mouth daily. 11/01/21   [provider]  liothyronine (CYTOMEL) 25 MCG tablet Take 25 mcg by mouth daily. Takes 1/2 tablet BID 09/02/21   [provider]  melatonin 5 MG TABS Take 5 mg by mouth at bedtime.     [provider]  metFORMIN (GLUCOPHAGE-XR) 500 MG 24 hr tablet TAKE 1 TABLET BY MOUTH EVERY DAY WITH BREAKFAST 08/22/21   Worthy Rancher B, FNP  metoprolol succinate (TOPROL-XL) 25 MG 24 hr tablet TAKE 1 TABLET BY MOUTH  DAILY 06/22/20   Sherlene Shams, MD  mirabegron ER (MYRBETRIQ) 50 MG TB24 tablet Take 50 mg by mouth daily.    [provider]  mupirocin ointment (BACTROBAN) 2 % Apply 1 Application topically 2 (two) times daily. To eyebrows 11/05/21   Sherlene Shams, MD  ondansetron (ZOFRAN) 4 MG tablet Take 1 tablet (4 mg total) by mouth every 8 (eight) hours as needed for nausea or vomiting. 03/19/20   McLean-Scocuzza, Pasty Spillers, MD  QUEtiapine (SEROQUEL) 300 MG tablet Take 300 mg by mouth at bedtime. 06/30/21   [provider]  ropinirole (REQUIP) 5 MG tablet Take 5 mg by mouth as needed.  09/13/18   [provider]  tizanidine (ZANAFLEX) 6 MG capsule TAKE 1 CAPSULE BY MOUTH THREE TIMES A DAY AS NEEDED FOR MUSCLE SPASMS 09/02/16   Sherlene Shams, MD      Allergies    Ancef [cefazolin sodium], Penicillins, Clarithromycin, Doxycycline, Egg-derived products, Seroquel [quetiapine fumarate], Sulfamethoxazole-trimethoprim, and Other    Review of Systems   Review of Systems  Physical Exam Updated Vital Signs BP 129/83   Pulse 77   Temp  98.3 F (36.8 C) (Oral)   Resp 16   Ht 1.651 m (5\' 5" )   Wt 121.6 kg   SpO2 98%   BMI 44.60 kg/m  Physical Exam Vitals and nursing note reviewed.  Constitutional:      General: She is not in acute distress.    Appearance: She is well-developed.  HENT:     Head: Normocephalic and atraumatic.     Right Ear: External ear normal.     Left Ear: Tympanic membrane and external ear normal.     Nose: Nose normal.     Mouth/Throat:     Mouth: Mucous membranes are moist.     Pharynx: Oropharynx is clear.     Comments: Clean extraction site noted on left upper jaw with In place Implant noted in right upper area with no  surrounding redness or tenderness Eyes:     Conjunctiva/sclera: Conjunctivae normal.     Pupils: Pupils are equal, round, and reactive to light.     Comments: No tenderness over I No temporal tenderness  Cardiovascular:     Rate and Rhythm: Normal rate and regular rhythm.  Pulmonary:     Effort: Pulmonary effort is normal.  Abdominal:     General: Bowel sounds are normal. There is no distension.     Palpations: Abdomen is soft.  Musculoskeletal:        General: Normal range of motion.     Cervical back: Normal range of motion and neck supple.  Skin:    General: Skin is warm and dry.  Neurological:     Mental Status: She is alert and oriented to person, place, and time.     Motor: No abnormal muscle tone.     Coordination: Coordination normal.  Psychiatric:        Behavior: Behavior normal.        Thought Content: Thought content normal.     ED Results / Procedures / Treatments   Labs (all labs ordered are listed, but only abnormal results are displayed) Labs Reviewed  CBC  BASIC METABOLIC PANEL    EKG None  Radiology CT Head Wo Contrast  Result Date: 08/25/2022 CLINICAL DATA:  Headache, new onset (Age >= 51y) EXAM: CT HEAD WITHOUT CONTRAST TECHNIQUE: Contiguous axial images were obtained from the base of the skull through the vertex without intravenous contrast. RADIATION DOSE REDUCTION: This exam was performed according to the departmental dose-optimization program which includes automated exposure control, adjustment of the mA and/or kV according to patient size and/or use of iterative reconstruction technique. COMPARISON:  MR Head 09/19/20 FINDINGS: Brain: No evidence of acute infarction, hemorrhage, hydrocephalus, extra-axial collection or mass lesion/mass effect. Vascular: No hyperdense vessel or unexpected calcification. Skull: Normal. Negative for fracture or focal lesion. Sinuses/Orbits: No middle ear mastoid effusion. Paranasal sinuses are clear. Orbits are  unremarkable. Other: None. IMPRESSION: No acute intracranial abnormality. No specific etiology for headaches identified Electronically Signed   By: Lorenza Cambridge M.D.   On: 08/25/2022 14:48    Procedures Procedures    Medications Ordered in ED Medications  oxyCODONE-acetaminophen (PERCOCET/ROXICET) 5-325 MG per tablet 2 tablet (2 tablets Oral Given 08/25/22 1352)    ED Course/ Medical Decision Making/ A&P Clinical Course as of 08/25/22 1511  Thu Aug 25, 2022  1457 CT reviewed interpreted no evidence no acute intracranial abnormality and radiologist interpretation concurs [DR]  1458 Basic metabolic panel reviewed interpreted notes no abnormality CBC reviewed interpreted no evidence of abnormality [DR]    Clinical  Course User Index [DR] Margarita Grizzle, MD                                 Medical Decision Making Amount and/or Complexity of Data Reviewed Labs: ordered. Radiology: ordered.  Risk Prescription drug management.   67 year old lady presents with headache for 2 weeks.  Headache was not sudden in onset.  She has not had fever Differential diagnosis includes but is not limited to intracerebral hemorrhage, acute intracranial infection, otitis media,, other bacterial infections including.,  Angle-closure glaucoma, tension headache, migraine Patient evaluated here with labs and CT.  No evidence of acute abnormality noted Patient treated here with oxycodone 10 mg and has some improvement.  She appears stable for discharge.  She is advised regarding return precautions and need for follow-up and voices understanding.        Final Clinical Impression(s) / ED Diagnoses Final diagnoses:  Acute nonintractable headache, unspecified headache type    Rx / DC Orders ED Discharge Orders     None         Margarita Grizzle, MD 08/25/22 1511

## 2022-10-03 ENCOUNTER — Other Ambulatory Visit: Payer: Self-pay | Admitting: Family Medicine

## 2022-10-03 DIAGNOSIS — Z1231 Encounter for screening mammogram for malignant neoplasm of breast: Secondary | ICD-10-CM

## 2022-10-18 ENCOUNTER — Inpatient Hospital Stay: Admission: RE | Admit: 2022-10-18 | Payer: Medicare Other | Source: Ambulatory Visit

## 2022-10-23 ENCOUNTER — Ambulatory Visit: Payer: Medicare Other

## 2023-02-09 ENCOUNTER — Encounter: Payer: Self-pay | Admitting: Gastroenterology

## 2023-02-10 ENCOUNTER — Ambulatory Visit: Admission: RE | Admit: 2023-02-10 | Payer: Medicare Other | Source: Home / Self Care | Admitting: Gastroenterology

## 2023-02-10 SURGERY — COLONOSCOPY WITH PROPOFOL
Anesthesia: General

## 2023-03-03 ENCOUNTER — Ambulatory Visit: Admission: RE | Admit: 2023-03-03 | Payer: Medicare Other | Source: Home / Self Care | Admitting: Gastroenterology

## 2023-03-03 SURGERY — COLONOSCOPY
Anesthesia: General

## 2023-03-20 ENCOUNTER — Emergency Department (HOSPITAL_COMMUNITY)
Admission: EM | Admit: 2023-03-20 | Discharge: 2023-03-21 | Disposition: A | Discharge status: Home / Self Care | Attending: Emergency Medicine | Admitting: Emergency Medicine

## 2023-03-20 ENCOUNTER — Emergency Department (HOSPITAL_COMMUNITY)

## 2023-03-20 ENCOUNTER — Encounter (HOSPITAL_COMMUNITY): Payer: Self-pay

## 2023-03-20 DIAGNOSIS — R1031 Right lower quadrant pain: Secondary | ICD-10-CM | POA: Diagnosis present

## 2023-03-20 LAB — CBC
HCT: 47 % — ABNORMAL HIGH (ref 36.0–46.0)
Hemoglobin: 14.1 g/dL (ref 12.0–15.0)
MCH: 28.9 pg (ref 26.0–34.0)
MCHC: 30 g/dL (ref 30.0–36.0)
MCV: 96.3 fL (ref 80.0–100.0)
Platelets: 295 10*3/uL (ref 150–400)
RBC: 4.88 MIL/uL (ref 3.87–5.11)
RDW: 13.8 % (ref 11.5–15.5)
WBC: 6.7 10*3/uL (ref 4.0–10.5)
nRBC: 0 % (ref 0.0–0.2)

## 2023-03-20 LAB — URINALYSIS, ROUTINE W REFLEX MICROSCOPIC
Bacteria, UA: NONE SEEN
Bilirubin Urine: NEGATIVE
Glucose, UA: NEGATIVE mg/dL
Hgb urine dipstick: NEGATIVE
Ketones, ur: NEGATIVE mg/dL
Nitrite: NEGATIVE
Protein, ur: NEGATIVE mg/dL
Specific Gravity, Urine: 1.017 (ref 1.005–1.030)
pH: 5 (ref 5.0–8.0)

## 2023-03-20 LAB — COMPREHENSIVE METABOLIC PANEL
ALT: 36 U/L (ref 0–44)
AST: 27 U/L (ref 15–41)
Albumin: 4.1 g/dL (ref 3.5–5.0)
Alkaline Phosphatase: 50 U/L (ref 38–126)
Anion gap: 10 (ref 5–15)
BUN: 12 mg/dL (ref 8–23)
CO2: 26 mmol/L (ref 22–32)
Calcium: 9.3 mg/dL (ref 8.9–10.3)
Chloride: 102 mmol/L (ref 98–111)
Creatinine, Ser: 0.81 mg/dL (ref 0.44–1.00)
GFR, Estimated: >60 mL/min (ref >60)
Glucose, Bld: 119 mg/dL — ABNORMAL HIGH (ref 70–99)
Potassium: 4.3 mmol/L (ref 3.5–5.1)
Sodium: 138 mmol/L (ref 135–145)
Total Bilirubin: 0.8 mg/dL (ref 0.0–1.2)
Total Protein: 7.5 g/dL (ref 6.5–8.1)

## 2023-03-20 LAB — LIPASE, BLOOD: Lipase: 33 U/L (ref 11–51)

## 2023-03-20 MED ORDER — MORPHINE SULFATE (PF) 4 MG/ML IV SOLN
4.0000 mg | Freq: Once | INTRAVENOUS | Status: AC
  Administered 2023-03-20: 4 mg via INTRAVENOUS
  Filled 2023-03-20: qty 1

## 2023-03-20 MED ORDER — SODIUM CHLORIDE (PF) 0.9 % IJ SOLN
INTRAMUSCULAR | Status: AC
  Filled 2023-03-20: qty 50

## 2023-03-20 MED ORDER — IOHEXOL 300 MG/ML  SOLN
100.0000 mL | Freq: Once | INTRAMUSCULAR | Status: AC | PRN
  Administered 2023-03-20: 100 mL via INTRAVENOUS

## 2023-03-20 MED ORDER — ONDANSETRON HCL 4 MG/2ML IJ SOLN
4.0000 mg | Freq: Once | INTRAMUSCULAR | Status: AC
  Administered 2023-03-20: 4 mg via INTRAVENOUS
  Filled 2023-03-20: qty 2

## 2023-03-20 MED ORDER — SODIUM CHLORIDE 0.9 % IV BOLUS
500.0000 mL | Freq: Once | INTRAVENOUS | Status: AC
  Administered 2023-03-20: 500 mL via INTRAVENOUS

## 2023-03-20 NOTE — ED Provider Notes (Signed)
 [place] EMERGENCY DEPARTMENT AT [facility] Provider Note   CSN: [id number] Arrival date & time: 03/20/23  2034     History  Chief Complaint  Patient presents with   Abdominal Pain    [name] is a 68 y.o. female, history of pancreatitis, fatty liver disease, who presents to the ED secondary to right lower quadrant abdominal pain, this been going on for the last 6 weeks.  She states it was just achy at first, and now it in the past day and a half, is become very sharp and stabbing.  She feels like she has a low-grade fever, and has nausea, and had large volumes of diarrhea yesterday.  She states that she only had 2 episodes of diarrhea, but they were large volumes, and so she decided to come to the ER because the pain has intensified so much.  She denies any recent antibiotic use.  Denies any chest pain, or shortness of breath.  No recent illnesses.    Home Medications Prior to Admission medications   Medication Sig Start Date End Date Taking? Authorizing Provider  albuterol (VENTOLIN HFA) 108 (90 Base) MCG/ACT inhaler Inhale 2 puffs into the lungs every 6 (six) hours as needed for wheezing or shortness of breath. 07/02/20   Sherlene Shams, MD  amphetamine-dextroamphetamine (ADDERALL) 20 MG tablet Take 20 mg by mouth daily. 08/25/21   [provider]  clonazePAM (KLONOPIN) 0.5 MG tablet TAKE 1/2 OF A TABLET (0.25 MG TOTAL) BY MOUTH TWICE A DAY 11/13/20   Sherlene Shams, MD  desonide (DESOWEN) 0.05 % cream Apply 1 Application topically 2 (two) times daily. 10/25/21   [provider]  DULoxetine (CYMBALTA) 60 MG capsule Take 60 mg by mouth daily.  07/05/18   [provider]  gabapentin (NEURONTIN) 600 MG tablet Take 600 mg by mouth 2 (two) times daily. 08/24/21   [provider]  hydrOXYzine (ATARAX) 25 MG tablet Take 1 tablet (25 mg total) by mouth every 6 (six) hours. 01/06/22   Mickie Bail, NP  levothyroxine (SYNTHROID) 75 MCG tablet Take  75 mcg by mouth daily. 11/01/21   [provider]  liothyronine (CYTOMEL) 25 MCG tablet Take 25 mcg by mouth daily. Takes 1/2 tablet BID 09/02/21   [provider]  melatonin 5 MG TABS Take 5 mg by mouth at bedtime.    [provider]  metFORMIN (GLUCOPHAGE-XR) 500 MG 24 hr tablet TAKE 1 TABLET BY MOUTH EVERY DAY WITH BREAKFAST 08/22/21   Worthy Rancher B, FNP  metoprolol succinate (TOPROL-XL) 25 MG 24 hr tablet TAKE 1 TABLET BY MOUTH  DAILY 06/22/20   Sherlene Shams, MD  mirabegron ER (MYRBETRIQ) 50 MG TB24 tablet Take 50 mg by mouth daily.    [provider]  mupirocin ointment (BACTROBAN) 2 % Apply 1 Application topically 2 (two) times daily. To eyebrows 11/05/21   Sherlene Shams, MD  ondansetron (ZOFRAN) 4 MG tablet Take 1 tablet (4 mg total) by mouth every 8 (eight) hours as needed for nausea or vomiting. 03/19/20   McLean-Scocuzza, Pasty Spillers, MD  QUEtiapine (SEROQUEL) 300 MG tablet Take 300 mg by mouth at bedtime. 06/30/21   [provider]  ropinirole (REQUIP) 5 MG tablet Take 5 mg by mouth as needed.  09/13/18   [provider]  tizanidine (ZANAFLEX) 6 MG capsule TAKE 1 CAPSULE BY MOUTH THREE TIMES A DAY AS NEEDED FOR MUSCLE SPASMS 09/02/16   Sherlene Shams, MD  Allergies    Ancef [cefazolin sodium]; Penicillins; Clarithromycin; Doxycycline; Egg-derived products; Quetiapine; Seroquel [quetiapine fumarate]; Sulfamethoxazole-trimethoprim; Egg solids, whole; and Other    Review of Systems   Review of Systems  Constitutional:  Negative for fever.  Gastrointestinal:  Positive for abdominal pain, diarrhea and nausea.    Physical Exam Updated Vital Signs BP (!) 151/80 (BP Location: Right Arm)   Pulse 72   Temp 98.6 F (37 C) (Oral)   Resp 18   SpO2 98%  Physical Exam Vitals and nursing note reviewed.  Constitutional:      General: She is not in acute distress.    Appearance: She is well-developed.  HENT:     Head: Normocephalic  and atraumatic.  Eyes:     Conjunctiva/sclera: Conjunctivae normal.  Cardiovascular:     Rate and Rhythm: Normal rate and regular rhythm.     Heart sounds: No murmur heard. Pulmonary:     Effort: Pulmonary effort is normal. No respiratory distress.     Breath sounds: Normal breath sounds.  Abdominal:     Palpations: Abdomen is soft.     Tenderness: There is abdominal tenderness in the right lower quadrant. There is guarding.  Musculoskeletal:        General: No swelling.     Cervical back: Neck supple.  Skin:    General: Skin is warm and dry.     Capillary Refill: Capillary refill takes less than 2 seconds.  Neurological:     Mental Status: She is alert.  Psychiatric:        Mood and Affect: Mood normal.     ED Results / Procedures / Treatments   Labs (all labs ordered are listed, but only abnormal results are displayed) Labs Reviewed  COMPREHENSIVE METABOLIC PANEL - Abnormal; Notable for the following components:      Result Value   Glucose, Bld 119 (*)    All other components within normal limits  CBC - Abnormal; Notable for the following components:   HCT 47.0 (*)    All other components within normal limits  URINALYSIS, ROUTINE W REFLEX MICROSCOPIC - Abnormal; Notable for the following components:   APPearance HAZY (*)    Leukocytes,Ua TRACE (*)    All other components within normal limits  LIPASE, BLOOD    EKG None  Radiology No results found.  Procedures Procedures    Medications Ordered in ED Medications  morphine (PF) 4 MG/ML injection 4 mg (4 mg Intravenous Given 03/20/23 2248)  ondansetron (ZOFRAN) injection 4 mg (4 mg Intravenous Given 03/20/23 2247)  sodium chloride 0.9 % bolus 500 mL (0 mLs Intravenous Stopped 03/20/23 2345)    ED Course/ Medical Decision Making/ A&P                                 Medical Decision Making Patient is a 69 year old female, here for right lower quadrant abdominal pain, this been going on for the last 6 weeks,  markedly worse, in the last day and a half.  Has a nausea, and diarrhea, but no recent antibiotic use.  She has right lower quadrant abdominal pain, with guarding, we will obtain a CT abdomen pelvis, for further evaluation.  Additionally will obtain labs, and urinalysis.  She has no vaginal symptoms, and no hip tenderness on my exam.  She is even went to her orthopedic surgeon, and they stated it is not her hip.   Amount and/or  Complexity of Data Reviewed Labs: ordered.    Details: Unremarkable labs Radiology: ordered. Discussion of management or test interpretation with external provider(s): Patient is a 68 year old female, here for right lower quadrant abdominal pain, has been going on for the last 6 weeks, markedly worse in the last day and a half.  Labs are unremarkable, CT ab pelvis pending at this time, signed out to [place], [place], he will follow-up on results of exam spoke accordingly.  Suspicious for possible diverticulitis?  Risk Prescription drug management.    Final Clinical Impression(s) / ED Diagnoses Final diagnoses:  None    Rx / DC Orders ED Discharge Orders     None         [name], Harley [place], PA 03/20/23 2346    Estelle June A, DO 03/21/23 1500

## 2023-03-20 NOTE — ED Triage Notes (Signed)
 X weeks RLQ, c/o diarrhea x a few days, decreased appetite

## 2023-03-21 NOTE — ED Provider Notes (Signed)
  Physical Exam  BP 130/60   Pulse 91   Temp 98.4 F (36.9 C) (Oral)   Resp 16   SpO2 96%   Physical Exam  Procedures  Procedures  ED Course / MDM    Medical Decision Making Amount and/or Complexity of Data Reviewed Labs: ordered.   Patient care assumed at shift handoff from previous provider.  Please see her note for full details.  In short, 68 year old female patient with history of pancreatitis, fatty liver disease presented complaining of 6+ weeks of right lower quadrant abdominal pain.  Pain is described as sharp and stabbing.  She reports a possible low-grade fever with some nausea and diarrhea.  Diarrhea was reported as 2 episodes of large-volume.  She denies chest pain, shortness of breath, recent illness, recent antibiotic use.  At time of shift handoff plans to follow-up with CT abdomen pelvis for acute findings.  CT abdomen pelvis with contrast: No acute findings in the abdomen or pelvis.    Diffuse colonic diverticulosis.  No active diverticulitis.  I agree with the radiologist findings.  I discussed the findings with the patient.  No acute imaging findings or lab results to explain patient's symptoms.  At this time I see no emergent cause of patient's symptoms and feel that the patient would benefit from further evaluation by primary care.  Patient feels comfortable with this plan.  No indication for further emergent workup.  Patient discharged home in stable condition with return precautions.      Pamala Duffel 03/21/23 0207    Nira Conn, MD 03/21/23 [phone]

## 2023-03-21 NOTE — ED Notes (Signed)
 Pt walked out prior to giving discharge papers, will provide AVS to pt in lobby, refused d/c vitals.

## 2023-03-21 NOTE — Discharge Instructions (Signed)
 Your overall workup this evening was reassuring.  I recommend following up with your primary care team for further evaluation of your chronic abdominal pain.  If you develop any life-threatening symptoms please return to the emergency department for further evaluation.

## 2023-03-23 NOTE — Progress Notes (Addendum)
 Subjective Patient ID: [name] is a 68 y.o. female.  [name] is a 68 y.o. female presenting to clinic complaining of sinus pressure, sinus pain, headache for the past week.  Patient also began developing eye redness, discomfort, itchiness and discharge over the past 3-4 days.  She notes her left eye is worse than her right.  Patient followed by ophthalmology and has been treated with topical and oral azithromycin , was seen earlier this month.  Patient states the Z-Pack did not help her sinus symptoms.  She has cataract surgery on 04/05/2023 and wants to be sure her eyes are cleared up before then.   History provided by:  Patient Language interpreter used: No     Review of Systems  Constitutional:  Negative for chills and fever.  HENT:  Positive for congestion, sinus pressure and sinus pain. Negative for ear discharge, ear pain, facial swelling, postnasal drip, rhinorrhea, sore throat, trouble swallowing and voice change.   Eyes:  Positive for pain, discharge, redness and itching. Negative for photophobia and visual disturbance.  Respiratory:  Negative for cough and shortness of breath.   Neurological:  Positive for headaches.    Patient History  Allergies: Allergies  Allergen Reactions  . Cefazolin Anaphylaxis  . Penicillins Anaphylaxis  . Doxycycline      Nausea/vomiting  . Egg-Derived Products Itching  . Quetiapine Other    Other Reaction(s): Other (See Comments)  fatigue  . Clarithromycin Itching  . Other Itching and Other    Walnuts - itching throat     History reviewed. No pertinent past medical history. History reviewed. No pertinent surgical history. Social History   Socioeconomic History  . Marital status: Married    Spouse name: Not on file  . Number of children: Not on file  . Years of education: Not on file  . Highest education level: Not on file  Occupational History  . Not on file  Tobacco Use  . Smoking status: Never  . Smokeless  tobacco: Never  Substance and Sexual Activity  . Alcohol use: Not on file  . Drug use: Not on file  . Sexual activity: Not on file  Other Topics Concern  . Not on file  Social History Narrative  . Not on file   No family history on file. Current Outpatient Medications on File Prior to Visit  Medication Sig Dispense Refill  . amphetamine-dextroamphetamine (Adderall) 20 MG tablet     . clonazePAM  (KlonoPIN ) 0.5 MG tablet TAKE 1/2 BY MOUTH TWICE A DAY    . DULoxetine (Cymbalta) 60 MG DR capsule Take 60 mg by mouth 1 (one) time each day in the morning.    . gabapentin  (Neurontin ) 300 MG capsule TAKE 1 CAPSULE BY MOUTH TWICE DAILY IN MORNING AND IN AFTERNOON    . levothyroxine  (Synthroid , Levoxyl ) 88 MCG tablet Take 1 tablet by mouth 1 (one) time each day.    . metFORMIN  XR (Glucophage -XR) 500 MG 24 hr tablet TAKE 1 TABLET BY MOUTH EVERY DAY WITH BREAKFAST    . metoprolol  succinate XL (Toprol -XL) 25 MG 24 hr tablet Take 25 mg by mouth 1 (one) time each day.    . Mirabegron ER (Myrbetriq) 50 MG tablet sustained-release 24 hour     . QUEtiapine (SEROquel) 300 MG tablet Take 300 mg by mouth 1 (one) time each day. TAKE 1 TABLET BY MOUTH EVERYDAY AT BEDTIME     No current facility-administered medications on file prior to visit.     Objective  Vitals:   03/23/23 1344  BP: 134/82  Pulse: 90  Resp: 19  Temp: 36.7 C (98.1 F)  TempSrc: Tympanic  SpO2: 98%  Weight: 124 kg  Height: 5' 5  PainSc: 0-No pain                No results found.  Physical Exam Vitals and nursing note reviewed.  Constitutional:      General: She is not in acute distress.    Appearance: Normal appearance. She is not ill-appearing.  HENT:     Head: Normocephalic.     Nose: No congestion or rhinorrhea.     Right Turbinates: Swollen. Not enlarged.     Left Turbinates: Swollen. Not enlarged.     Right Sinus: Maxillary sinus tenderness and frontal sinus tenderness present.     Left Sinus: Maxillary  sinus tenderness and frontal sinus tenderness present.     Mouth/Throat:     Lips: Pink.     Mouth: Mucous membranes are moist.     Pharynx: Oropharynx is clear. Uvula midline. No posterior oropharyngeal erythema or postnasal drip.  Eyes:     General: Lids are normal. Vision grossly intact.     Extraocular Movements: Extraocular movements intact.     Conjunctiva/sclera:     Right eye: Right conjunctiva is injected. Exudate present.     Left eye: Left conjunctiva is injected. Exudate present.     Comments: Left conjunctival injection worse than right.  Neurological:     Mental Status: She is alert.      No results found for this visit on 03/23/23.   Procedures MDM:     1 Acute, uncomplicated illness or injury     Explanation of Medical Decision Making and variances from expected care:    Patient with ongoing sinus symptoms, now with bacterial conjunctivitis.  Likely bacterial sinusitis.   RX levofloxacin and ofloxacin ophthalmic solution for treatment.  Patient with extensive allergies and was also recently on azithromycin  without improvement. Expected clinical course.  Recheck if worsening or not improving.  Recommended ophthalmology follow up for eyes if not improving.  ED precautions given.     Assessment requiring historian other than patient: No     Independent visualization of image, tracing, or test: No     Discussion of management with another provider: No     Risk:: Moderate          Assessment/Plan Diagnoses and all orders for this visit:  Acute bacterial conjunctivitis of both eyes -     ofloxacin (Ocuflox) 0.3 % ophthalmic solution; Administer 1 drop into both eyes in the morning and 1 drop at noon and 1 drop in the evening and 1 drop before bedtime. Do all this for 7 days.  Acute bacterial sinusitis -     levoFLOXacin (Levaquin) 750 MG tablet; Take 1 tablet (750 mg total) by mouth 1 (one) time each day for 7 days.      Disposition Status:  Home  Progress note signed by Gerard Gaskins, PA on 03/24/23 at  7:35 AM

## 2023-03-23 NOTE — Progress Notes (Signed)
 Pain and itchiness in eyes x 4 days  Pain in face and headache

## 2023-03-26 NOTE — Progress Notes (Signed)
 Chief Complaint: Chief Complaint  Patient presents with  . Follow-up    Subjective: [name] is a 68 y.o. female in today for: ER follow-up Patient is brought in today by her husband for ER follow-up.  Was seen there for right lower quadrant pain.  CT scan did not show any abnormalities of the appendix, did have multiple diverticula but no evidence of diverticulitis.  Also notes right lower back pain.  No injuries or falls.  Does note that she has had a low-grade fever of 99.5.  No other evidence of infection.  Did have some diarrhea at first.  ER felt this could be a viral syndrome.  Orthopedics did not think this was musculoskeletal.  Past Medical History:  Diagnosis Date  . ADD (attention deficit disorder)   . Allergy   . Back pain   . Bipolar 2 disorder (CMS/HHS-HCC)   . Cardiomyopathy, secondary (CMS/HHS-HCC)   . COPD (chronic obstructive pulmonary disease) (CMS/HHS-HCC)   . Depression    Sees Dr. Spencer  . DM (diabetes mellitus), type 2 (CMS/HHS-HCC)   . Edema leg    Right  . Endocrine problem 01/27/2014  . Fatty liver   . Hypertension   . Mitral valve prolapse   . Neuralgia, neuritis, and radiculitis, unspecified 04/19/2013  . Obesity   . Seizure disorder (CMS/HHS-HCC)   . Sleep apnea   . Thyroid  disease   . Vision abnormalities    Past Surgical History:  Procedure Laterality Date  . COLONOSCOPY  12/17/2007   [facility] (Mother)  . OTHER SURGERY  2012   Rotator Cuff Repair  . OTHER SURGERY  2012   Repair of fractured nose  . COLONOSCOPY  04/15/2013   [facility] (Mother) CBF 04/2018 Recall ltr mailed   . Back Surgery    . OTHER SURGERY     Excision of Lipoma  . OTHER SURGERY     Wisdom Teeth Extraction  . TONSILLECTOMY     Family History  Problem Relation Name Age of Onset  . Macular degeneration Mother    . Heart disease Father    . Sudden cardiac death Father    . No Known Problems Sister    . Alopecia Brother    . Heart disease Maternal Aunt    . No  Known Problems Maternal Uncle    . No Known Problems Paternal Aunt    . No Known Problems Paternal Uncle    . No Known Problems Maternal Grandmother    . Heart disease Maternal Grandfather    . No Known Problems Paternal Grandmother    . No Known Problems Paternal Grandfather    . Allergic rhinitis Neg Hx    . Amblyopia Neg Hx    . Ankylosing spondylitis Neg Hx    . Atrial fibrillation (Abnormal heart rhythm sometimes requiring treatment with blood thinners) Neg Hx    . Basal cell carcinoma Neg Hx    . Blindness Neg Hx    . Cataracts Neg Hx    . Coronary Artery Disease (Blocked arteries around heart) Neg Hx    . Diabetes Neg Hx    . Diabetes type I Neg Hx    . Diabetes type II Neg Hx    . Glaucoma Neg Hx    . Graves' disease Neg Hx    . Hashimoto's thyroiditis Neg Hx    . High blood pressure (Hypertension) Neg Hx    . Hyperthyroidism Neg Hx    . Hypothyroidism Neg Hx    .  Melanoma Neg Hx    . Neuropathy Neg Hx    . Renal Insufficiency Neg Hx    . Retinal degeneration Neg Hx    . Retinopathy of prematurity Neg Hx    . Rheum arthritis Neg Hx    . Sarcoidosis Neg Hx    . Sinusitis Neg Hx    . Sleep apnea Neg Hx    . Strabismus Neg Hx    . Thyroid  disease Neg Hx    . Vision loss Neg Hx     Current Outpatient Medications on File Prior to Visit  Medication Sig Dispense Refill  . albuterol  90 mcg/actuation inhaler Inhale 2 inhalations into the lungs once daily as needed for Wheezing    . aspirin 81 MG EC tablet Take 81 mg by mouth once daily    . betamethasone dipropionate 0.05 % lotion 1(ONE) APPLICATION(S) TOPICAL EVERY DAY    . clonazePAM  (KLONOPIN ) 0.5 MG tablet Take 0.25 mg by mouth 2 (two) times daily as needed for Anxiety    . dextroamphetamine-amphetamine (ADDERALL) 10 mg tablet Take 10 mg by mouth every morning    . diphenhydrAMINE (BENADRYL) 25 mg capsule Take 25 mg by mouth every 6 (six) hours as needed for Itching    . doxepin (SINEQUAN) 25 MG capsule TAKE 1-2  CAPSULES BY MOUTH EVERY DAY AT BEDTIME    . DULoxetine (CYMBALTA) 60 MG DR capsule Take 60 mg by mouth once daily    . ketoconazole (NIZORAL) 2 % shampoo WASH SCALP 3(THREE) TIMES A WEEK. LEAVE IT PLACE FOR 30 MINUTES BEFORE RINSING.    SABRA levoFLOXacin (LEVAQUIN) 750 MG tablet Take 750 mg by mouth    . levothyroxine  (SYNTHROID ) 88 MCG tablet Take 88 mcg by mouth once daily Take on an empty stomach with a glass of water at least 30-60 minutes before breakfast.    . liothyronine (CYTOMEL) 25 MCG tablet Take 12.5 mcg by mouth once daily Take on an empty stomach with a glass of water at least 30-60 minutes before breakfast.    . melatonin 5 mg Tab Take by mouth    . metFORMIN  (GLUCOPHAGE -XR) 500 MG XR tablet TAKE 1 TABLET BY MOUTH EVERY DAY WITH DINNER 90 tablet 3  . metoprolol  SUCCinate (TOPROL -XL) 25 MG XL tablet TAKE 1 TABLET BY MOUTH EVERY DAY 90 tablet 3  . MYRBETRIQ 50 mg ER tablet TAKE 1 TABLET BY MOUTH EVERY DAY 90 tablet 3  . ofloxacin (OCUFLOX) 0.3 % ophthalmic solution Apply 1 drop to eye    . QUEtiapine (SEROQUEL) 100 MG tablet Take 100 mg by mouth at bedtime    . rOPINIRole (REQUIP) 5 MG tablet at bedtime    . TiZANidine  (ZANAFLEX ) 6 MG capsule Take 6 mg by mouth once daily as needed for Muscle spasms    . ergocalciferol , vitamin D2, 1,250 mcg (50,000 unit) capsule Take 50,000 Units by mouth every 7 (seven) days    . gabapentin  (NEURONTIN ) 600 MG tablet Take 1 tablet (600 mg total) by mouth 2 (two) times daily for 90 days 180 tablet 3  . hydrOXYzine  (ATARAX ) 25 MG tablet TAKE 1 TABLET BY MOUTH 3 TIMES DAILY AS NEEDED FOR ITCHING. (Patient not taking: Reported on 03/29/2023) 60 tablet 1  . neomycin -polymyxin-dexAMETHasone (MAXITROL) ophthalmic suspension Place 1 drop into the left eye 3 (three) times daily    . ondansetron  (ZOFRAN ) 4 MG tablet Take 4 mg by mouth every 8 (eight) hours as needed for Nausea    . predniSONE  (  DELTASONE ) 10 MG tablet Take two tablets (20 mg total) x 5 days then  one tablet (10 mg total) x 5 days (Patient not taking: Reported on 11/18/2022) 15 tablet 0  . QUEtiapine (SEROQUEL) 300 MG tablet Take 300 mg by mouth at bedtime    . sodium, potassium, and magnesium (SUPREP) oral solution Take 1 Bottle by mouth as directed One kit contains 2 bottles.  Take both bottles at the times instructed by your provider. (Patient not taking: Reported on 03/29/2023) 354 mL 0   No current facility-administered medications on file prior to visit.    Review of Systems - 10 systems negative except as in HPI Objective: Vitals:   03/29/23 0907  BP: 136/86  Pulse: 98   Body mass index is 45.7 kg/m.  In general she is very animated today complaining of pain. Lungs are clear to auscultation bilaterally Heart is regular rate and rhythm without murmurs rubs gallops Abdomen is soft, mild to moderate right lower quadrant tenderness, no rebound She does have tenderness over the right SI joint that is significant Extremities no change in edema Assessment & Plan: Diagnoses and all orders for this visit:  Low grade fever, unspecified -     CBC w/auto Differential (3 Part) Recheck CBC today, was normal at ER Abdominal pain, RLQ (right lower quadrant) -     CBC w/auto Differential (3 Part) As above Pain of right sacroiliac joint -     Ambulatory Referral to Physiatrist for further evaluation and treatment  Bipolar I disorder, most recent episode depressed (CMS/HHS-HCC) Continue to follow with psychiatry Cardiomyopathy, nonischemic (CMS/HHS-HCC) Stable, continue to follow with cardiology Class 3 severe obesity due to excess calories with serious comorbidity and body mass index (BMI) of 45.0 to 49.9 in adult (CMS/HHS-HCC) Work on diet, exercise, weight loss Type 2 diabetes mellitus with other specified complication, without long-term current use of insulin (CMS/HHS-HCC) Last A1c well-controlled

## 2023-06-28 ENCOUNTER — Encounter

## 2023-06-28 ENCOUNTER — Ambulatory Visit
Admission: RE | Admit: 2023-06-28 | Discharge: 2023-06-28 | Disposition: A | Discharge status: Home / Self Care | Source: Ambulatory Visit | Attending: Family Medicine | Admitting: Family Medicine

## 2023-06-28 DIAGNOSIS — Z1231 Encounter for screening mammogram for malignant neoplasm of breast: Secondary | ICD-10-CM | POA: Insufficient documentation

## 2023-07-14 NOTE — Progress Notes (Signed)
 Obstetrics & Gynecology Office Visit   Chief Complaint:   HPI: [name] is a 68 y.o. [phone] female here for Follow-up   She is an established patient who presents today for follow-up after ultrasound for an ongoing problem of right lower quadrant pain. Today she continues to experience RLQ, her pain today is 6/10. She was seen by her PCP at the beginning of July, a referral to gastroenterology and physiatry was placed. Currently being worked up for recent tick bites.   She reports of increase amount of stress and decrease in vision, recently seen for central serous chorioretinopathy with ophthalmology. Reports of needing further retina evaluation.   Ultrasound on 07/14/23 demonstrates: Uterus - c/w postmenopause involution Endometrial thickness 4.1 mm BL ovaries wnl No free fluid  Interval history: from last visit 06/15/2023 She reports initial onset of pain for the past 3 months. Her pain is described as a intermittent achy, sharp, and throbbing located in her RLQ. It radiates down to her anterior and posterior leg down to her knee.    Denies associated symptoms including fever, vomiting, diarrhea and urinary symptoms. Her pain can become severe enough that it limits her daily functioning. Reports her pain is worse with lifting her knee and applying body weight to her right leg. Reports that it feels different when palpating, that . Has concerns with ascites versus ovarian cyst. Denies any recent falls or trauma to her right side.    She was seen in the ED at initial onset of symptoms, CT and lab work done with no abnormalities. Most recently, she followed up with primary care last week with negative lab work and urinalysis. It was recommended that she follow a low-FODMAP diet for her hepatic steatosis. It was recommended she follow up with GI for her symptoms but has not yet.    Reports her last pelvic exam was back in 2019. Does have history of abnormal pap back in the 80's, she  had colposcopy with benign pathology.   NOTE: I have reviewed imaging prior to final read. Will await final report.  Past Medical History:  has a past medical history of ADD (attention deficit disorder), Allergy, Back pain, Bipolar 2 disorder (CMS/HHS-HCC), Cardiomyopathy, secondary (CMS/HHS-HCC), Central serous chorioretinopathy of both eyes, COPD (chronic obstructive pulmonary disease) (CMS/HHS-HCC), Depression, DM (diabetes mellitus), type 2 (CMS/HHS-HCC), Edema leg, Endocrine problem (01/27/2014), Fatty liver, Hypertension, Mitral valve prolapse, Neuralgia, neuritis, and radiculitis, unspecified (04/19/2013), Obesity, Seizure disorder (CMS/HHS-HCC), Sleep apnea, Thyroid  disease, and Vision abnormalities.  Past Surgical History:  has a past surgical history that includes Tonsillectomy; other surgery; other surgery; other surgery (2012); other surgery (2012); Back Surgery; Colonoscopy (12/17/2007); and Colonoscopy (04/15/2013). Family History: family history includes Alopecia in her brother; Heart disease in her father, maternal aunt, and maternal grandfather; Macular degeneration in her mother; No Known Problems in her maternal grandmother, maternal uncle, paternal aunt, paternal grandfather, paternal grandmother, paternal uncle, and sister; Sudden cardiac death in her father. Social History:  reports that she quit smoking about 6 years ago. Her smoking use included cigarettes. She has been exposed to tobacco smoke. She has never used smokeless tobacco. She reports current alcohol use. She reports that she does not currently use drugs. OB/GYN History:  OB History     Gravida  2   Para  2   Term  2   Preterm      AB      Living  2      SAB  IAB      Ectopic      Molar      Multiple      Live Births             Allergies: is allergic to ancef [cefazolin], cefazolin sodium, penicillins, bactrim [sulfamethoxazole -trimethoprim], quetiapine, quetiapine fumarate,  clarithromycin, and other. Medications:  Current Outpatient Medications:  .  albuterol  90 mcg/actuation inhaler, Inhale 2 inhalations into the lungs once daily as needed for Wheezing, Disp: , Rfl:  .  aspirin 81 MG EC tablet, Take 81 mg by mouth once daily, Disp: , Rfl:  .  betamethasone dipropionate 0.05 % lotion, 1(ONE) APPLICATION(S) TOPICAL EVERY DAY, Disp: , Rfl:  .  dextroamphetamine-amphetamine (ADDERALL) 10 mg tablet, Take 10 mg by mouth every morning, Disp: , Rfl:  .  diphenhydrAMINE (BENADRYL) 25 mg capsule, Take 25 mg by mouth every 6 (six) hours as needed for Itching, Disp: , Rfl:  .  doxepin (SINEQUAN) 25 MG capsule, TAKE 1-2 CAPSULES BY MOUTH EVERY DAY AT BEDTIME, Disp: , Rfl:  .  DULoxetine (CYMBALTA) 60 MG DR capsule, Take 60 mg by mouth once daily, Disp: , Rfl:  .  melatonin 5 mg Tab, Take by mouth, Disp: , Rfl:  .  metFORMIN  (GLUCOPHAGE -XR) 500 MG XR tablet, TAKE 1 TABLET BY MOUTH EVERY DAY WITH DINNER, Disp: 90 tablet, Rfl: 3 .  metoprolol  SUCCinate (TOPROL -XL) 25 MG XL tablet, TAKE 1 TABLET BY MOUTH EVERY DAY, Disp: 90 tablet, Rfl: 3 .  MYRBETRIQ 50 mg ER tablet, TAKE 1 TABLET BY MOUTH EVERY DAY, Disp: 90 tablet, Rfl: 3 .  QUEtiapine (SEROQUEL) 100 MG tablet, Take 100 mg by mouth at bedtime, Disp: , Rfl:  .  tirzepatide 2.5 mg/0.1 mL Syrg, Inject 2.5 mg subcutaneously once a week, Disp: , Rfl:  .  clonazePAM  (KLONOPIN ) 0.5 MG tablet, Take 0.25 mg by mouth 2 (two) times daily as needed for Anxiety, Disp: , Rfl:  .  ketoconazole (NIZORAL) 2 % shampoo, WASH SCALP 3(THREE) TIMES A WEEK. LEAVE IT PLACE FOR 30 MINUTES BEFORE RINSING., Disp: , Rfl:  .  levothyroxine  (SYNTHROID ) 88 MCG tablet, Take 88 mcg by mouth once daily Take on an empty stomach with a glass of water at least 30-60 minutes before breakfast., Disp: , Rfl:  .  liothyronine (CYTOMEL) 25 MCG tablet, Take 12.5 mcg by mouth once daily Take on an empty stomach with a glass of water at least 30-60 minutes before  breakfast., Disp: , Rfl:  .  rOPINIRole (REQUIP) 5 MG tablet, at bedtime, Disp: , Rfl:  .  tacrolimus  (PROTOPIC ) 0.1 % ointment, APPLY TOPICALLY 2 (TWO) TIMES DAILY APPLY OVER THE UPPER AND LOWER EYELIDS, Disp: 60 g, Rfl: 1 .  TiZANidine  (ZANAFLEX ) 6 MG capsule, Take 6 mg by mouth once daily as needed for Muscle spasms, Disp: , Rfl:    Review of Systems:   ROS: Pertinent positives and negatives in the HPI, otherwise an 8-system review of systems was negative.   Exam:   Vitals:   07/14/23 1414  BP: 130/80   Body mass index is 45.43 kg/m.  General Appearance:    Well-developed, well-nourished, no acute distress, appears stated age  Lungs:     Respirations unlabored  Neuro: Psych:   Alert, oriented x3  Appropriate mood and insight, judgement intact    TVUS: See above  Impression:   The encounter diagnosis was Abdominal pain, RLQ (right lower quadrant).  Plan:   1. Abdominal pain, RLQ (right lower  quadrant) Discussed ultrasound findings today, no abnormalities seen that could be causing her RLQ pain. Denies vaginal and urinary symptoms today. I recommended she follow up with GI and physiatry for further evaluation. Reviewed warning signs and symptoms to return to clinic.   Return if symptoms worsen or fail to improve.   Attestation Statement:   I personally performed the service, non-incident to. (WP)   KATHERINE PHILLIPS, NP  [facility]  07/14/2023 3:41 PM

## 2023-07-28 NOTE — Progress Notes (Signed)
 68 yo F here for retinal exam   The Chief Complaint, HPI, ROS and PFSHx as documented were reviewed and I agree as documented, or revised as indicated.   AOFVD OU vs Int Dry AMD OU A/P: AOFVD vs Int Dry AMD (less likely give app). Life style healthy diet, UV protection, start AREDS2  ALt Cover, AG  RTC 3 mo Tech OCT only 6 mo OCT/AF, exam

## 2023-08-31 ENCOUNTER — Other Ambulatory Visit: Payer: Self-pay

## 2023-08-31 ENCOUNTER — Emergency Department

## 2023-08-31 ENCOUNTER — Emergency Department
Admission: EM | Admit: 2023-08-31 | Discharge: 2023-08-31 | Disposition: A | Discharge status: Home / Self Care | Attending: Emergency Medicine | Admitting: Emergency Medicine

## 2023-08-31 ENCOUNTER — Encounter: Payer: Self-pay | Admitting: Emergency Medicine

## 2023-08-31 DIAGNOSIS — J449 Chronic obstructive pulmonary disease, unspecified: Secondary | ICD-10-CM | POA: Diagnosis not present

## 2023-08-31 DIAGNOSIS — R112 Nausea with vomiting, unspecified: Secondary | ICD-10-CM | POA: Diagnosis present

## 2023-08-31 DIAGNOSIS — E860 Dehydration: Secondary | ICD-10-CM | POA: Diagnosis not present

## 2023-08-31 DIAGNOSIS — I10 Essential (primary) hypertension: Secondary | ICD-10-CM | POA: Diagnosis not present

## 2023-08-31 DIAGNOSIS — R1010 Upper abdominal pain, unspecified: Secondary | ICD-10-CM | POA: Insufficient documentation

## 2023-08-31 DIAGNOSIS — E119 Type 2 diabetes mellitus without complications: Secondary | ICD-10-CM | POA: Diagnosis not present

## 2023-08-31 LAB — URINALYSIS, ROUTINE W REFLEX MICROSCOPIC
Bilirubin Urine: NEGATIVE
Glucose, UA: NEGATIVE mg/dL
Hgb urine dipstick: NEGATIVE
Ketones, ur: NEGATIVE mg/dL
Nitrite: NEGATIVE
Protein, ur: NEGATIVE mg/dL
Specific Gravity, Urine: 1.026 (ref 1.005–1.030)
pH: 5 (ref 5.0–8.0)

## 2023-08-31 LAB — HEPATIC FUNCTION PANEL
ALT: 16 U/L (ref 0–44)
AST: 15 U/L (ref 15–41)
Albumin: 3.6 g/dL (ref 3.5–5.0)
Alkaline Phosphatase: 48 U/L (ref 38–126)
Bilirubin, Direct: 0.1 mg/dL (ref 0.0–0.2)
Indirect Bilirubin: 0.7 mg/dL (ref 0.3–0.9)
Total Bilirubin: 0.8 mg/dL (ref 0.0–1.2)
Total Protein: 7.1 g/dL (ref 6.5–8.1)

## 2023-08-31 LAB — BASIC METABOLIC PANEL WITH GFR
Anion gap: 12 (ref 5–15)
BUN: 14 mg/dL (ref 8–23)
CO2: 23 mmol/L (ref 22–32)
Calcium: 9.1 mg/dL (ref 8.9–10.3)
Chloride: 103 mmol/L (ref 98–111)
Creatinine, Ser: 0.94 mg/dL (ref 0.44–1.00)
GFR, Estimated: >60 mL/min (ref >60)
Glucose, Bld: 143 mg/dL — ABNORMAL HIGH (ref 70–99)
Potassium: 3.6 mmol/L (ref 3.5–5.1)
Sodium: 138 mmol/L (ref 135–145)

## 2023-08-31 LAB — CBC
HCT: 48.1 % — ABNORMAL HIGH (ref 36.0–46.0)
Hemoglobin: 15.3 g/dL — ABNORMAL HIGH (ref 12.0–15.0)
MCH: 28.2 pg (ref 26.0–34.0)
MCHC: 31.8 g/dL (ref 30.0–36.0)
MCV: 88.7 fL (ref 80.0–100.0)
Platelets: 293 K/uL (ref 150–400)
RBC: 5.42 MIL/uL — ABNORMAL HIGH (ref 3.87–5.11)
RDW: 13.4 % (ref 11.5–15.5)
WBC: 8.4 K/uL (ref 4.0–10.5)
nRBC: 0 % (ref 0.0–0.2)

## 2023-08-31 LAB — TROPONIN I (HIGH SENSITIVITY)
Troponin I (High Sensitivity): 4 ng/L (ref <18)
Troponin I (High Sensitivity): 5 ng/L (ref <18)

## 2023-08-31 LAB — LIPASE, BLOOD: Lipase: 25 U/L (ref 11–51)

## 2023-08-31 MED ORDER — ONDANSETRON HCL 4 MG/2ML IJ SOLN
4.0000 mg | Freq: Once | INTRAMUSCULAR | Status: AC
  Administered 2023-08-31: 4 mg via INTRAVENOUS
  Filled 2023-08-31: qty 2

## 2023-08-31 MED ORDER — IOHEXOL 300 MG/ML  SOLN
100.0000 mL | Freq: Once | INTRAMUSCULAR | Status: AC | PRN
  Administered 2023-08-31: 100 mL via INTRAVENOUS

## 2023-08-31 MED ORDER — SODIUM CHLORIDE 0.9 % IV BOLUS
1000.0000 mL | Freq: Once | INTRAVENOUS | Status: AC
  Administered 2023-08-31: 1000 mL via INTRAVENOUS

## 2023-08-31 MED ORDER — ONDANSETRON 4 MG PO TBDP: 4.0000 mg | ORAL_TABLET | Freq: Three times a day (TID) | ORAL | 0 refills | Status: AC | PRN

## 2023-08-31 NOTE — ED Triage Notes (Signed)
 Pt via POV from home. Pt c/o vomiting and generalized abd pain for the past week. Also reports some SOB that started 12 hours ago. Denies cough/congestion. Denies fevers. Reports hx of COPD. Denies sick contacts. Pt is A&Ox4 but tachypneic.

## 2023-08-31 NOTE — Discharge Instructions (Addendum)
 You may use the Zofran  as needed for nausea.  Gradually advance your diet.  Make sure you are getting plenty of fluids, but drink small sips at a time.  Return to the ER for new, worsening, or persistent severe nausea or vomiting, abdominal pain, chest pain, shortness of breath, or any other new or worsening symptoms that concern you.

## 2023-08-31 NOTE — ED Provider Notes (Signed)
 [facility] Provider Note    Event Date/Time   First MD Initiated Contact with Patient 08/31/23 [phone]     (approximate)   History   Shortness of Breath   HPI  [name] is a 68 y.o. female with a history of hypertension, seizure disorder, COPD, diabetes, cardiomyopathy, and sleep apnea who presents with nausea and vomiting.  The patient states that she started to have upper abdominal pain a week ago along with nausea and numerous episodes of vomiting.  She has not had any associated diarrhea.  She states that the pain has mostly subsided although she still feels very nauseous.  She feels lightheaded and weak.  She also started having some shortness of breath this morning and felt palpitations but denies any chest pain.  She denies any cough or fever.  She had 1 bowel movement a couple of days ago but has not gone since then.  I reviewed the past medical records per the patient's most recent outpatient encounter was on 7/25 with ophthalmology.  Previously she was seen by OB/GYN on 7/11 for follow-up.  She has no recent hospitalizations.   Physical Exam   Triage Vital Signs: ED Triage Vitals  Encounter Vitals Group     BP 08/31/23 0758 (!) 117/95     Girls Systolic BP Percentile --      Girls Diastolic BP Percentile --      Boys Systolic BP Percentile --      Boys Diastolic BP Percentile --      Pulse Rate 08/31/23 0758 (!) 125     Resp 08/31/23 0758 (!) 24     Temp 08/31/23 0758 97.7 F (36.5 C)     Temp Source 08/31/23 0758 Oral     SpO2 08/31/23 0758 94 %     Weight 08/31/23 0756 250 lb (113.4 kg)     Height 08/31/23 0756 5' 5 (1.651 m)     Head Circumference --      Peak Flow --      Pain Score 08/31/23 0756 0     Pain Loc --      Pain Education --      Exclude from Growth Chart --     Most recent vital signs: Vitals:   08/31/23 1030 08/31/23 1045  BP: (!) 113/54   Pulse: (!) 101 (!) 102  Resp: (!) 24 14  Temp:    SpO2: 96% 96%      General: Awake, no distress.  CV:  Good peripheral perfusion.  Resp:  Normal effort.  Slight wheezes bilaterally.  No rales or rhonchi. Abd:  Soft with mild epigastric tenderness.  No distention.  Other:  Very dry mucous membranes.  No jaundice or scleral icterus.  No peripheral edema.   ED Results / Procedures / Treatments   Labs (all labs ordered are listed, but only abnormal results are displayed) Labs Reviewed  BASIC METABOLIC PANEL WITH GFR - Abnormal; Notable for the following components:      Result Value   Glucose, Bld 143 (*)    All other components within normal limits  CBC - Abnormal; Notable for the following components:   RBC 5.42 (*)    Hemoglobin 15.3 (*)    HCT 48.1 (*)    All other components within normal limits  URINALYSIS, ROUTINE W REFLEX MICROSCOPIC - Abnormal; Notable for the following components:   Color, Urine YELLOW (*)    APPearance CLOUDY (*)    Leukocytes,Ua MODERATE (*)  Bacteria, UA FEW (*)    All other components within normal limits  URINE CULTURE  LIPASE, BLOOD  HEPATIC FUNCTION PANEL  TROPONIN I (HIGH SENSITIVITY)  TROPONIN I (HIGH SENSITIVITY)     EKG  ED ECG REPORT I, Waylon Cassis, the attending physician, personally viewed and interpreted this ECG.  Date: 08/31/2023 EKG Time: 0758 Rate: 123 Rhythm: Sinus tachycardia QRS Axis: normal Intervals: Right axis ST/T Wave abnormalities: Nonspecific T wave abnormality Narrative Interpretation: no evidence of acute ischemia    RADIOLOGY  Chest x-ray: I independently viewed and interpreted the images; there is no focal consolidation or edema  CT abdomen/pelvis: No acute abnormality   PROCEDURES:  Critical Care performed: No  Procedures   MEDICATIONS ORDERED IN ED: Medications  sodium chloride  0.9 % bolus 1,000 mL (0 mLs Intravenous Stopped 08/31/23 1047)  ondansetron  (ZOFRAN ) injection 4 mg (4 mg Intravenous Given 08/31/23 0856)  iohexol  (OMNIPAQUE ) 300  MG/ML solution 100 mL (100 mLs Intravenous Contrast Given 08/31/23 1001)     IMPRESSION / MDM / ASSESSMENT AND PLAN / ED COURSE  I reviewed the triage vital signs and the nursing notes.  68 year old female with PMH as noted above presents primarily for nausea and vomiting over the last week initially associated with some upper abdominal pain although this has mostly subsided.  She has also had some shortness of breath this morning.  On exam she was initially tachycardic in triage although the heart rate was right around 100 when I saw her.  O2 saturation is in the high 90s on room air.  Other vitals are normal.  Abdomen is soft with mild epigastric tenderness.  Mucous membranes are very dry.  Differential diagnosis includes, but is not limited to, gastroenteritis, foodborne illness, gastritis, PUD, pancreatitis, other hepatobiliary cause, colitis, diverticulitis, bowel obstruction.  I feel that the shortness of breath is likely secondary to the other symptoms although it is possible that the patient is having an mild COPD exacerbation.  She appears quite dehydrated.  Given that the heart rate has normalized and she is not hypoxic and not having any chest pain, there is no clinical evidence to suggest PE.  We will obtain chest x-ray, CT abdomen/pelvis, lab workup, give fluids, antiemetics, and reassess.  Patient's presentation is most consistent with acute presentation with potential threat to life or bodily function.  The patient is on the cardiac monitor to evaluate for evidence of arrhythmia and/or significant heart rate changes.  ----------------------------------------- 12:00 PM on 08/31/2023 -----------------------------------------  Chest x-ray and CT are both negative for acute findings.  BMP and CBC show no acute abnormality except for possible mild hemoconcentration.  There is no leukocytosis.  LFTs and lipase are normal.  Urinalysis shows no abnormal findings although no specific  findings for UTI such as nitrites.  The patient denies any urinary symptoms.  At this time there is no indication for treatment of UTI.  I have added on a urine culture order.  The patient is feeling significantly better.  She is tolerating p.o.  She denies any chest pain or shortness of breath.  She has no abdominal pain at this time.  She states that the nausea is significantly improved.  On my reassessment her heart rate was in the 90s.  At this time, the patient is stable for discharge home.  Overall I suspect gastritis, gastroparesis, gastroenteritis.  I counseled her on the results of the workup and plan of care.  I will prescribe Zofran .  I gave strict return  precautions, and she expressed understanding.   FINAL CLINICAL IMPRESSION(S) / ED DIAGNOSES   Final diagnoses:  Nausea and vomiting, unspecified vomiting type  Dehydration     Rx / DC Orders   ED Discharge Orders          Ordered    ondansetron  (ZOFRAN -ODT) 4 MG disintegrating tablet  Every 8 hours PRN        08/31/23 1159             Note:  This document was prepared using Dragon voice recognition software and may include unintentional dictation errors.    Jacolyn Pae, MD 08/31/23 [phone]

## 2023-09-01 LAB — URINE CULTURE

## 2023-09-09 ENCOUNTER — Emergency Department
Admission: EM | Admit: 2023-09-09 | Discharge: 2023-09-09 | Disposition: A | Discharge status: Home / Self Care | Attending: Emergency Medicine | Admitting: Emergency Medicine

## 2023-09-09 ENCOUNTER — Other Ambulatory Visit: Payer: Self-pay

## 2023-09-09 DIAGNOSIS — R197 Diarrhea, unspecified: Secondary | ICD-10-CM | POA: Insufficient documentation

## 2023-09-09 DIAGNOSIS — R112 Nausea with vomiting, unspecified: Secondary | ICD-10-CM | POA: Insufficient documentation

## 2023-09-09 LAB — URINALYSIS, ROUTINE W REFLEX MICROSCOPIC
Bilirubin Urine: NEGATIVE
Glucose, UA: NEGATIVE mg/dL
Hgb urine dipstick: NEGATIVE
Ketones, ur: NEGATIVE mg/dL
Nitrite: NEGATIVE
Protein, ur: 30 mg/dL — AB
Specific Gravity, Urine: 1.019 (ref 1.005–1.030)
pH: 7 (ref 5.0–8.0)

## 2023-09-09 LAB — COMPREHENSIVE METABOLIC PANEL WITH GFR
ALT: 24 U/L (ref 0–44)
AST: 20 U/L (ref 15–41)
Albumin: 3.5 g/dL (ref 3.5–5.0)
Alkaline Phosphatase: 52 U/L (ref 38–126)
Anion gap: 9 (ref 5–15)
BUN: 13 mg/dL (ref 8–23)
CO2: 27 mmol/L (ref 22–32)
Calcium: 8.9 mg/dL (ref 8.9–10.3)
Chloride: 101 mmol/L (ref 98–111)
Creatinine, Ser: 0.92 mg/dL (ref 0.44–1.00)
GFR, Estimated: >60 mL/min (ref >60)
Glucose, Bld: 136 mg/dL — ABNORMAL HIGH (ref 70–99)
Potassium: 4.1 mmol/L (ref 3.5–5.1)
Sodium: 137 mmol/L (ref 135–145)
Total Bilirubin: 0.8 mg/dL (ref 0.0–1.2)
Total Protein: 6.6 g/dL (ref 6.5–8.1)

## 2023-09-09 LAB — CBC
HCT: 50.4 % — ABNORMAL HIGH (ref 36.0–46.0)
Hemoglobin: 15.8 g/dL — ABNORMAL HIGH (ref 12.0–15.0)
MCH: 28.4 pg (ref 26.0–34.0)
MCHC: 31.3 g/dL (ref 30.0–36.0)
MCV: 90.5 fL (ref 80.0–100.0)
Platelets: 258 K/uL (ref 150–400)
RBC: 5.57 MIL/uL — ABNORMAL HIGH (ref 3.87–5.11)
RDW: 13.8 % (ref 11.5–15.5)
WBC: 13 K/uL — ABNORMAL HIGH (ref 4.0–10.5)
nRBC: 0 % (ref 0.0–0.2)

## 2023-09-09 LAB — LIPASE, BLOOD: Lipase: 24 U/L (ref 11–51)

## 2023-09-09 MED ORDER — SODIUM CHLORIDE 0.9 % IV SOLN: Freq: Once | INTRAVENOUS | Status: AC

## 2023-09-09 MED ORDER — MORPHINE SULFATE (PF) 4 MG/ML IV SOLN
4.0000 mg | Freq: Once | INTRAVENOUS | Status: AC
  Administered 2023-09-09: 4 mg via INTRAVENOUS
  Filled 2023-09-09: qty 1

## 2023-09-09 MED ORDER — ONDANSETRON HCL 4 MG/2ML IJ SOLN
4.0000 mg | Freq: Once | INTRAMUSCULAR | Status: AC
  Administered 2023-09-09: 4 mg via INTRAVENOUS
  Filled 2023-09-09: qty 2

## 2023-09-09 MED ORDER — METRONIDAZOLE 500 MG PO TABS
500.0000 mg | ORAL_TABLET | Freq: Two times a day (BID) | ORAL | 0 refills | Status: AC
Start: 2023-09-09 — End: 2023-09-14

## 2023-09-09 NOTE — ED Notes (Signed)
 Refusing VS monitoring

## 2023-09-09 NOTE — ED Notes (Signed)
Pt refusing VS monitoring. 

## 2023-09-09 NOTE — ED Notes (Signed)
Pt given tissues as requested.

## 2023-09-09 NOTE — ED Notes (Signed)
 See triage note,  pt reports here for reoccurrence sx of v/d intermittent abd cramping. Reports emesis x1, diarrhea x3 last 24 hours.

## 2023-09-09 NOTE — ED Provider Notes (Addendum)
 [facility] Provider Note    Event Date/Time   First MD Initiated Contact with Patient 09/09/23 [phone]     (approximate)   History   N/V/D   HPI  [name] is a 68 y.o. female who presents with complaints of nausea vomiting and diarrhea.  Patient reports this has been ongoing for about 16 days now.  Initially started as nausea vomiting and then diarrhea started about a week ago.  Was seen in the emergency department per review of records on August 28 improved after fluids and nausea medication.  Had CT scan at that time which was reassuring  She reports stool is typically yellowish but no black tarry stools.  No vomiting of blood.  No blood in her stool  No significant abdominal pain.     Physical Exam   Triage Vital Signs: ED Triage Vitals  Encounter Vitals Group     BP 09/09/23 0751 125/87     Girls Systolic BP Percentile --      Girls Diastolic BP Percentile --      Boys Systolic BP Percentile --      Boys Diastolic BP Percentile --      Pulse Rate 09/09/23 0751 (!) 101     Resp 09/09/23 0751 20     Temp 09/09/23 0751 98.1 F (36.7 C)     Temp Source 09/09/23 0751 Oral     SpO2 09/09/23 0751 95 %     Weight 09/09/23 0752 113.4 kg (249 lb 15.7 oz)     Height 09/09/23 0752 1.651 m (5' 5)     Head Circumference --      Peak Flow --      Pain Score 09/09/23 0752 8     Pain Loc --      Pain Education --      Exclude from Growth Chart --     Most recent vital signs: Vitals:   09/09/23 0751 09/09/23 0930  BP: 125/87 113/61  Pulse: (!) 101 73  Resp: 20   Temp: 98.1 F (36.7 C)   SpO2: 95% 98%     General: Awake, no distress.  CV:  Good peripheral perfusion.  Resp:  Normal effort.  Abd:  No distention.  Soft, nontender Other:     ED Results / Procedures / Treatments   Labs (all labs ordered are listed, but only abnormal results are displayed) Labs Reviewed  COMPREHENSIVE METABOLIC PANEL WITH GFR - Abnormal; Notable for the  following components:      Result Value   Glucose, Bld 136 (*)    All other components within normal limits  URINALYSIS, ROUTINE W REFLEX MICROSCOPIC - Abnormal; Notable for the following components:   Color, Urine YELLOW (*)    APPearance CLOUDY (*)    Protein, ur 30 (*)    Leukocytes,Ua SMALL (*)    Bacteria, UA RARE (*)    All other components within normal limits  CBC - Abnormal; Notable for the following components:   WBC 13.0 (*)    RBC 5.57 (*)    Hemoglobin 15.8 (*)    HCT 50.4 (*)    All other components within normal limits  GASTROINTESTINAL PANEL BY PCR, STOOL (REPLACES STOOL CULTURE)  C DIFFICILE QUICK SCREEN W PCR REFLEX    LIPASE, BLOOD     EKG  ED ECG REPORT I, Lamar Price, the attending physician, personally viewed and interpreted this ECG.  Date: 09/09/2023  Rhythm: normal sinus rhythm QRS  Axis: Left axis deviation Intervals: normal ST/T Wave abnormalities: normal Narrative Interpretation: no evidence of acute ischemia    RADIOLOGY     PROCEDURES:  Critical Care performed:   Procedures   MEDICATIONS ORDERED IN ED: Medications  0.9 %  sodium chloride  infusion (0 mLs Intravenous Stopped 09/09/23 1227)  ondansetron  (ZOFRAN ) injection 4 mg (4 mg Intravenous Given 09/09/23 0854)  morphine  (PF) 4 MG/ML injection 4 mg (4 mg Intravenous Given 09/09/23 1007)     IMPRESSION / MDM / ASSESSMENT AND PLAN / ED COURSE  I reviewed the triage vital signs and the nursing notes. Patient's presentation is most consistent with acute presentation with potential threat to life or bodily function.  Patient presents with nausea vomiting diarrhea as detailed above, symptoms been ongoing for over 2 weeks now, longer than would be expected for a viral gastroenteritis, question infectious diarrhea, C. difficile.  Lab work is pending, will treat with IV Zofran , IV fluids, obtain stool BioFire/C. difficile quick screen  ----------------------------------------- 9:56  AM on 09/09/2023 -----------------------------------------  Patient has informed us  that she would like to leave, she has not been able to provide a sample, she will be leaving AMA, she does have decisional capacity, and is unclear why she wants to leave suddenly  ----------------------------------------- 10:05 AM on 09/09/2023 ----------------------------------------- Patient has changed her mind and will stay for now  ----------------------------------------- 1:17 PM on 09/09/2023 ----------------------------------------- Patient still has not been able to produce any stool, no vomiting seems to be feeling much improved she is tolerating p.o.'s.  She does not want to continue trying for stool, she will follow-up with PCP to provide stool sample     FINAL CLINICAL IMPRESSION(S) / ED DIAGNOSES   Final diagnoses:  Diarrhea, unspecified type     Rx / DC Orders   ED Discharge Orders          Ordered    metroNIDAZOLE  (FLAGYL ) 500 MG tablet  2 times daily after meals        09/09/23 1329             Note:  This document was prepared using Dragon voice recognition software and may include unintentional dictation errors.   Arlander Charleston, MD 09/09/23 9043    Arlander Charleston, MD 09/09/23 1452

## 2023-09-09 NOTE — ED Triage Notes (Signed)
 Pt from home via POV. C/o vomiting & diarrhea x 17 days. Seen here on 08/28; states was given IV fluids & nausea meds - states no better since then. Pt states she called PCP yesterday evening and he is concerned that she may GI bleed d/t coffee ground appearance in stool; advised her to come to ED. Pt complains of stabbing epigastric, rates it 8/10. Pt tearful in triage.

## 2023-09-09 NOTE — ED Notes (Signed)
 Pt heard crying loudly, This RN responding to call bell stating wants to leave AMA, reports feels like has been left alone and cannot provide stool sample. RN explained that medications were given for nausea and fluids still being administered. Warm blankets given for comfort.  C/o abd pain

## 2023-09-09 NOTE — ED Notes (Signed)
 Pt unable to provide stool sample at this time

## 2023-09-12 ENCOUNTER — Emergency Department

## 2023-09-12 ENCOUNTER — Emergency Department
Admission: EM | Admit: 2023-09-12 | Discharge: 2023-09-12 | Disposition: A | Discharge status: Home / Self Care | Attending: Emergency Medicine | Admitting: Emergency Medicine

## 2023-09-12 ENCOUNTER — Other Ambulatory Visit: Payer: Self-pay

## 2023-09-12 DIAGNOSIS — I10 Essential (primary) hypertension: Secondary | ICD-10-CM | POA: Diagnosis not present

## 2023-09-12 DIAGNOSIS — J449 Chronic obstructive pulmonary disease, unspecified: Secondary | ICD-10-CM | POA: Diagnosis not present

## 2023-09-12 DIAGNOSIS — E119 Type 2 diabetes mellitus without complications: Secondary | ICD-10-CM | POA: Insufficient documentation

## 2023-09-12 DIAGNOSIS — K5732 Diverticulitis of large intestine without perforation or abscess without bleeding: Secondary | ICD-10-CM | POA: Diagnosis not present

## 2023-09-12 DIAGNOSIS — D72829 Elevated white blood cell count, unspecified: Secondary | ICD-10-CM | POA: Diagnosis not present

## 2023-09-12 DIAGNOSIS — R1031 Right lower quadrant pain: Secondary | ICD-10-CM | POA: Diagnosis present

## 2023-09-12 DIAGNOSIS — K5792 Diverticulitis of intestine, part unspecified, without perforation or abscess without bleeding: Secondary | ICD-10-CM

## 2023-09-12 LAB — CBC
HCT: 46.3 % — ABNORMAL HIGH (ref 36.0–46.0)
Hemoglobin: 14.7 g/dL (ref 12.0–15.0)
MCH: 28.9 pg (ref 26.0–34.0)
MCHC: 31.7 g/dL (ref 30.0–36.0)
MCV: 91.1 fL (ref 80.0–100.0)
Platelets: 224 K/uL (ref 150–400)
RBC: 5.08 MIL/uL (ref 3.87–5.11)
RDW: 14 % (ref 11.5–15.5)
WBC: 10.9 K/uL — ABNORMAL HIGH (ref 4.0–10.5)
nRBC: 0 % (ref 0.0–0.2)

## 2023-09-12 LAB — URINALYSIS, ROUTINE W REFLEX MICROSCOPIC
Bilirubin Urine: NEGATIVE
Glucose, UA: NEGATIVE mg/dL
Hgb urine dipstick: NEGATIVE
Ketones, ur: NEGATIVE mg/dL
Nitrite: NEGATIVE
Protein, ur: NEGATIVE mg/dL
Specific Gravity, Urine: 1.014 (ref 1.005–1.030)
pH: 5 (ref 5.0–8.0)

## 2023-09-12 LAB — COMPREHENSIVE METABOLIC PANEL WITH GFR
ALT: 20 U/L (ref 0–44)
AST: 20 U/L (ref 15–41)
Albumin: 3.4 g/dL — ABNORMAL LOW (ref 3.5–5.0)
Alkaline Phosphatase: 54 U/L (ref 38–126)
Anion gap: 12 (ref 5–15)
BUN: 18 mg/dL (ref 8–23)
CO2: 23 mmol/L (ref 22–32)
Calcium: 8.9 mg/dL (ref 8.9–10.3)
Chloride: 103 mmol/L (ref 98–111)
Creatinine, Ser: 0.95 mg/dL (ref 0.44–1.00)
GFR, Estimated: >60 mL/min (ref >60)
Glucose, Bld: 125 mg/dL — ABNORMAL HIGH (ref 70–99)
Potassium: 3.6 mmol/L (ref 3.5–5.1)
Sodium: 138 mmol/L (ref 135–145)
Total Bilirubin: 0.5 mg/dL (ref 0.0–1.2)
Total Protein: 6.9 g/dL (ref 6.5–8.1)

## 2023-09-12 LAB — LIPASE, BLOOD: Lipase: 34 U/L (ref 11–51)

## 2023-09-12 MED ORDER — FAMOTIDINE IN NACL 20-0.9 MG/50ML-% IV SOLN
20.0000 mg | Freq: Once | INTRAVENOUS | Status: AC
  Administered 2023-09-12: 20 mg via INTRAVENOUS
  Filled 2023-09-12: qty 50

## 2023-09-12 MED ORDER — ACETAMINOPHEN 500 MG PO TABS
1000.0000 mg | ORAL_TABLET | Freq: Once | ORAL | Status: AC
  Administered 2023-09-12: 1000 mg via ORAL
  Filled 2023-09-12: qty 2

## 2023-09-12 MED ORDER — IOHEXOL 300 MG/ML  SOLN
100.0000 mL | Freq: Once | INTRAMUSCULAR | Status: AC | PRN
  Administered 2023-09-12: 100 mL via INTRAVENOUS

## 2023-09-12 MED ORDER — SODIUM CHLORIDE 0.9 % IV BOLUS
1000.0000 mL | Freq: Once | INTRAVENOUS | Status: AC
  Administered 2023-09-12: 1000 mL via INTRAVENOUS

## 2023-09-12 NOTE — ED Notes (Signed)
 Pt stating she does still have appendix and gall bladder.

## 2023-09-12 NOTE — ED Triage Notes (Signed)
 Pt arrives via POV from [facility] with RLQ pain. Pt has had diarrhea for 3 weeks, and vomiting for 2 weeks (stopped 4 days ago). Pt endorses low grade fever, chills. Pt was last seen on 9/6 for the same thing, but also seen on 8/28 for the same. Pt is A&Ox4. Pt has hx of COPD, thyroid  issues.

## 2023-09-12 NOTE — ED Notes (Addendum)
 Lab called at this time due to labs not being in process. Lab to run them at this time.

## 2023-09-12 NOTE — ED Provider Notes (Signed)
 [facility] Provider Note    Event Date/Time   First MD Initiated Contact with Patient 09/12/23 1410     (approximate)   History   Abdominal Pain   HPI  [name] is a 68 y.o. female past medical history significant for hypertension, seizure disorder, COPD, diabetes, sleep apnea, who presents to the emergency department with abdominal pain.  Patient states that she is not been feeling well for the past couple of weeks.  States that she was having lots of nausea with vomiting and some intermittent diarrhea.  States that she is now having multiple episodes of diarrhea on a daily basis for the past 3 weeks.  States that she is having some bloody diarrhea.  Denies any melanotic stool.  Complaining of pain today that has been on the right side.  Denies any nausea or vomiting today.  No fever or chills.  Denies dysuria, urinary urgency or frequency.  No recent travel.  Recently started on Flagyl  for her diarrheal illness and does not feel that her symptoms have improved since that time.     Physical Exam   Triage Vital Signs: ED Triage Vitals  Encounter Vitals Group     BP 09/12/23 1250 132/77     Girls Systolic BP Percentile --      Girls Diastolic BP Percentile --      Boys Systolic BP Percentile --      Boys Diastolic BP Percentile --      Pulse Rate 09/12/23 1250 94     Resp 09/12/23 1250 18     Temp 09/12/23 1250 98.7 F (37.1 C)     Temp Source 09/12/23 1250 Oral     SpO2 09/12/23 1250 95 %     Weight 09/12/23 1254 253 lb (114.8 kg)     Height 09/12/23 1254 5' 5 (1.651 m)     Head Circumference --      Peak Flow --      Pain Score 09/12/23 1251 6     Pain Loc --      Pain Education --      Exclude from Growth Chart --     Most recent vital signs: Vitals:   09/12/23 1430 09/12/23 1524  BP: 115/68   Pulse: 83   Resp: 17   Temp:    SpO2: 100% 90%    Physical Exam Constitutional:      Appearance: She is well-developed.  HENT:      Head: Atraumatic.  Eyes:     Conjunctiva/sclera: Conjunctivae normal.  Cardiovascular:     Rate and Rhythm: Regular rhythm.  Pulmonary:     Effort: No respiratory distress.  Abdominal:     General: There is no distension.     Tenderness: There is abdominal tenderness in the right upper quadrant and right lower quadrant. There is no right CVA tenderness or left CVA tenderness. Negative signs include Murphy's sign and McBurney's sign.  Musculoskeletal:        General: Normal range of motion.     Cervical back: Normal range of motion.  Skin:    General: Skin is warm.  Neurological:     Mental Status: She is alert. Mental status is at baseline.     IMPRESSION / MDM / ASSESSMENT AND PLAN / ED COURSE  I reviewed the triage vital signs and the nursing notes.  Differential diagnosis including diverticulitis, diverticulosis, viral gastroenteritis, C. difficile, acute appendicitis, intra-abdominal abscess   Given IV fluids  and pain medication  No tachycardic or bradycardic dysrhythmias while on cardiac telemetry.  RADIOLOGY I independently reviewed imaging, my interpretation of imaging: CT scan abdomen and pelvis with contrast  LABS (all labs ordered are listed, but only abnormal results are displayed) Labs interpreted as -    Labs Reviewed  COMPREHENSIVE METABOLIC PANEL WITH GFR - Abnormal; Notable for the following components:      Result Value   Glucose, Bld 125 (*)    Albumin 3.4 (*)    All other components within normal limits  CBC - Abnormal; Notable for the following components:   WBC 10.9 (*)    HCT 46.3 (*)    All other components within normal limits  GASTROINTESTINAL PANEL BY PCR, STOOL (REPLACES STOOL CULTURE)  C DIFFICILE QUICK SCREEN W PCR REFLEX    LIPASE, BLOOD  URINALYSIS, ROUTINE W REFLEX MICROSCOPIC     MDM    GI pathogen panel was ordered however unable to provide a stool sample while in the emergency department.  Mild leukocytosis but no anemia,  hemoglobin today is 14.7 and appears to have a baseline of around 15.  No significant electrolyte abnormality and normal LFTs normal lipase.  Low suspicion for ACS, no epigastric or chest pain.  CT scan pending.  If CT scan is negative for acute abnormality plan to discharge home with symptomatic treatment and discussed that she could follow-up as an outpatient with her primary care physician for further stool samples.  Discussed that she would need follow-up with gastroenterology.    PROCEDURES:  Critical Care performed: No  Procedures  Patient's presentation is most consistent with acute presentation with potential threat to life or bodily function.   MEDICATIONS ORDERED IN ED: Medications  famotidine  (PEPCID ) IVPB 20 mg premix (has no administration in time range)  sodium chloride  0.9 % bolus 1,000 mL (1,000 mLs Intravenous New Bag/Given 09/12/23 1522)  acetaminophen  (TYLENOL ) tablet 1,000 mg (1,000 mg Oral Given 09/12/23 1524)  iohexol  (OMNIPAQUE ) 300 MG/ML solution 100 mL (100 mLs Intravenous Contrast Given 09/12/23 1550)    FINAL CLINICAL IMPRESSION(S) / ED DIAGNOSES   Final diagnoses:  None     Rx / DC Orders   ED Discharge Orders     None        Note:  This document was prepared using Dragon voice recognition software and may include unintentional dictation errors.   Suzanne Kirsch, MD 09/12/23 [phone]

## 2023-09-12 NOTE — ED Triage Notes (Signed)
 First Nurse Note: Patient to ED from [facility] for RLQ pain. Seen on 9/6 in ED for same. States pain is worse.

## 2023-09-12 NOTE — ED Provider Notes (Signed)
 Procedures     ----------------------------------------- 5:32 PM on 09/12/2023 -----------------------------------------  Urinalysis unremarkable.  CT reveals mild uncomplicated diverticulitis.  Patient does report that her PCP started her on Flagyl  and she is currently on day 4 of the course and noticed that since yesterday symptoms have been improving, diarrhea seems to have resolved.  She is not able to provide a stool sample in the ED.  She called her PCP this afternoon and was given a follow-up appointment soon.  With reassuring workup, improving symptoms on treatment by PCP, think she stable for discharge and continued outpatient follow-up.  She is comfortable with that plan to.    Viviann Pastor, MD 09/12/23 [phone]

## 2023-09-16 ENCOUNTER — Encounter: Payer: Self-pay | Admitting: Emergency Medicine

## 2023-09-16 ENCOUNTER — Other Ambulatory Visit: Payer: Self-pay

## 2023-09-16 ENCOUNTER — Emergency Department

## 2023-09-16 ENCOUNTER — Emergency Department
Admission: EM | Admit: 2023-09-16 | Discharge: 2023-09-16 | Disposition: A | Discharge status: Home / Self Care | Attending: Emergency Medicine | Admitting: Emergency Medicine

## 2023-09-16 DIAGNOSIS — R2232 Localized swelling, mass and lump, left upper limb: Secondary | ICD-10-CM | POA: Diagnosis present

## 2023-09-16 DIAGNOSIS — M7989 Other specified soft tissue disorders: Secondary | ICD-10-CM

## 2023-09-16 LAB — CBC WITH DIFFERENTIAL/PLATELET
Abs Immature Granulocytes: 0.02 K/uL (ref 0.00–0.07)
Basophils Absolute: 0.1 K/uL (ref 0.0–0.1)
Basophils Relative: 1 %
Eosinophils Absolute: 0.3 K/uL (ref 0.0–0.5)
Eosinophils Relative: 6 %
HCT: 44 % (ref 36.0–46.0)
Hemoglobin: 13.5 g/dL (ref 12.0–15.0)
Immature Granulocytes: 0 %
Lymphocytes Relative: 36 %
Lymphs Abs: 2.1 K/uL (ref 0.7–4.0)
MCH: 28.2 pg (ref 26.0–34.0)
MCHC: 30.7 g/dL (ref 30.0–36.0)
MCV: 91.9 fL (ref 80.0–100.0)
Monocytes Absolute: 0.6 K/uL (ref 0.1–1.0)
Monocytes Relative: 10 %
Neutro Abs: 2.6 K/uL (ref 1.7–7.7)
Neutrophils Relative %: 47 %
Platelets: 231 K/uL (ref 150–400)
RBC: 4.79 MIL/uL (ref 3.87–5.11)
RDW: 14.4 % (ref 11.5–15.5)
WBC: 5.6 K/uL (ref 4.0–10.5)
nRBC: 0 % (ref 0.0–0.2)

## 2023-09-16 LAB — COMPREHENSIVE METABOLIC PANEL WITH GFR
ALT: 28 U/L (ref 0–44)
AST: 39 U/L (ref 15–41)
Albumin: 3.2 g/dL — ABNORMAL LOW (ref 3.5–5.0)
Alkaline Phosphatase: 45 U/L (ref 38–126)
Anion gap: 11 (ref 5–15)
BUN: 16 mg/dL (ref 8–23)
CO2: 23 mmol/L (ref 22–32)
Calcium: 9 mg/dL (ref 8.9–10.3)
Chloride: 104 mmol/L (ref 98–111)
Creatinine, Ser: 0.9 mg/dL (ref 0.44–1.00)
GFR, Estimated: >60 mL/min (ref >60)
Glucose, Bld: 171 mg/dL — ABNORMAL HIGH (ref 70–99)
Potassium: 3.9 mmol/L (ref 3.5–5.1)
Sodium: 138 mmol/L (ref 135–145)
Total Bilirubin: 0.5 mg/dL (ref 0.0–1.2)
Total Protein: 6.7 g/dL (ref 6.5–8.1)

## 2023-09-16 NOTE — ED Provider Notes (Signed)
 [facility] Provider Note    Event Date/Time   First MD Initiated Contact with Patient 09/16/23 2109     (approximate)   History   Wrist Pain   HPI  Lariza Cothron is a 68 y.o. female who presents to the emergency department today because of concerns for left upper extremity swelling.  Symptoms started yesterday.  She states that it is painful.  She denies any numbness.  Denies any trauma to her hand or wrist.  Did have IVs placed 4 days ago.     Physical Exam   Triage Vital Signs: ED Triage Vitals  Encounter Vitals Group     BP 09/16/23 2006 (!) 152/90     Girls Systolic BP Percentile --      Girls Diastolic BP Percentile --      Boys Systolic BP Percentile --      Boys Diastolic BP Percentile --      Pulse Rate 09/16/23 2006 64     Resp 09/16/23 2006 18     Temp 09/16/23 2006 98.2 F (36.8 C)     Temp Source 09/16/23 2006 Oral     SpO2 09/16/23 2006 95 %     Weight 09/16/23 2007 253 lb 8.5 oz (115 kg)     Height --      Head Circumference --      Peak Flow --      Pain Score 09/16/23 2006 7     Pain Loc --      Pain Education --      Exclude from Growth Chart --     Most recent vital signs: Vitals:   09/16/23 2006  BP: (!) 152/90  Pulse: 64  Resp: 18  Temp: 98.2 F (36.8 C)  SpO2: 95%    General: Awake, no distress.  CV:  Good peripheral perfusion.  Resp:  Normal effort.  Abd:  No distention.  Other:  Left upper extremity with swelling to the forearm. Radial pulse 2+. Sensation intact to all finger pads. Some bruising noted to left [facility] where patient stated she had an IV   ED Results / Procedures / Treatments   Labs (all labs ordered are listed, but only abnormal results are displayed) Labs Reviewed  COMPREHENSIVE METABOLIC PANEL WITH GFR - Abnormal; Notable for the following components:      Result Value   Glucose, Bld 171 (*)    Albumin 3.2 (*)    All other components within normal limits  CBC WITH  DIFFERENTIAL/PLATELET     EKG  None   RADIOLOGY I independently interpreted and visualized the left wrist. My interpretation: No fracture Radiology interpretation:  IMPRESSION:  1. No acute fracture or dislocation.  2. Widening of the scapholunate interval, consistent with disruption of the  scapholunate ligament.  3. Advanced degenerative changes at the first carpometacarpal joint with radial  subluxation of the first metacarpal.   I independently interpreted and visualized the US  venous left upper extremity. My interpretation: No blood clot Radiology interpretation:  IMPRESSION:  1. No deep venous thrombosis in the left upper extremity.      PROCEDURES:  Critical Care performed: No    MEDICATIONS ORDERED IN ED: Medications - No data to display   IMPRESSION / MDM / ASSESSMENT AND PLAN / ED COURSE  I reviewed the triage vital signs and the nursing notes.  Differential diagnosis includes, but is not limited to, DVT, cellulitis, lymphedema  Patient's presentation is most consistent with acute presentation with potential threat to life or bodily function.  Patient presented to the emergency department today because of concerns for left forearm swelling.  On exam does have some swelling to the left forearm however no warmth or erythema.  Patient denies any falls.  A wrist x-ray was ordered from triage which was concerning for possible widening of the scapholunate interval.  However patient again denies any falls and is not tender over that area.  Did obtain an ultrasound of that arm which did not show any blood clots.  At this time I have low concern for cellulitis given lack of fever erythema or warmth.  Did advise patient to use compression and elevation.  Encouraged follow up.     FINAL CLINICAL IMPRESSION(S) / ED DIAGNOSES   Final diagnoses:  Arm swelling        Rx / DC Orders   ED Discharge Orders     None        Note:   This document was prepared using Dragon voice recognition software and may include unintentional dictation errors.    Floy Roberts, MD 09/16/23 [phone]

## 2023-09-16 NOTE — ED Triage Notes (Signed)
 Patient c/o left wrist swelling and pain since yesterday.  Patient recently had IV placed in that hand.  Patient referred here for possible US  from [facility]. No redness or warmth noted.  Swelling at wrist joint noted.

## 2023-09-30 ENCOUNTER — Other Ambulatory Visit: Payer: Self-pay

## 2023-09-30 ENCOUNTER — Emergency Department

## 2023-09-30 ENCOUNTER — Emergency Department
Admission: EM | Admit: 2023-09-30 | Discharge: 2023-10-01 | Disposition: A | Discharge status: Home / Self Care | Attending: Emergency Medicine | Admitting: Emergency Medicine

## 2023-09-30 ENCOUNTER — Encounter: Payer: Self-pay | Admitting: Emergency Medicine

## 2023-09-30 DIAGNOSIS — E119 Type 2 diabetes mellitus without complications: Secondary | ICD-10-CM | POA: Insufficient documentation

## 2023-09-30 DIAGNOSIS — R002 Palpitations: Secondary | ICD-10-CM | POA: Diagnosis not present

## 2023-09-30 DIAGNOSIS — R0602 Shortness of breath: Secondary | ICD-10-CM | POA: Diagnosis not present

## 2023-09-30 DIAGNOSIS — R079 Chest pain, unspecified: Secondary | ICD-10-CM

## 2023-09-30 DIAGNOSIS — R5383 Other fatigue: Secondary | ICD-10-CM | POA: Diagnosis not present

## 2023-09-30 DIAGNOSIS — R197 Diarrhea, unspecified: Secondary | ICD-10-CM | POA: Insufficient documentation

## 2023-09-30 DIAGNOSIS — I10 Essential (primary) hypertension: Secondary | ICD-10-CM | POA: Diagnosis not present

## 2023-09-30 DIAGNOSIS — R072 Precordial pain: Secondary | ICD-10-CM | POA: Insufficient documentation

## 2023-09-30 LAB — BASIC METABOLIC PANEL WITH GFR
Anion gap: 16 — ABNORMAL HIGH (ref 5–15)
BUN: 12 mg/dL (ref 8–23)
CO2: 21 mmol/L — ABNORMAL LOW (ref 22–32)
Calcium: 9.3 mg/dL (ref 8.9–10.3)
Chloride: 103 mmol/L (ref 98–111)
Creatinine, Ser: 0.73 mg/dL (ref 0.44–1.00)
GFR, Estimated: >60 mL/min (ref >60)
Glucose, Bld: 146 mg/dL — ABNORMAL HIGH (ref 70–99)
Potassium: 4 mmol/L (ref 3.5–5.1)
Sodium: 140 mmol/L (ref 135–145)

## 2023-09-30 LAB — CBC
HCT: 41.1 % (ref 36.0–46.0)
Hemoglobin: 13.2 g/dL (ref 12.0–15.0)
MCH: 28.5 pg (ref 26.0–34.0)
MCHC: 32.1 g/dL (ref 30.0–36.0)
MCV: 88.8 fL (ref 80.0–100.0)
Platelets: 359 K/uL (ref 150–400)
RBC: 4.63 MIL/uL (ref 3.87–5.11)
RDW: 15.3 % (ref 11.5–15.5)
WBC: 10.2 K/uL (ref 4.0–10.5)
nRBC: 0 % (ref 0.0–0.2)

## 2023-09-30 LAB — TROPONIN I (HIGH SENSITIVITY): Troponin I (High Sensitivity): 4 ng/L (ref <18)

## 2023-09-30 NOTE — ED Provider Notes (Signed)
 [facility] Provider Note    Event Date/Time   First MD Initiated Contact with Patient 09/30/23 2258     (approximate)   History   Chest Pain and Shortness of Breath   HPI  [name] is a 68 y.o. female   Past medical history of thyroid  disease, hypertension, hyperlipidemia, anxiety, diabetes, here with a number of symptoms dating back approximately 1 month.  She has had 1 month of palpitations.  She had worn a Holter monitor recently and is awaiting results from her PMD.  She has had off-and-on diarrhea for 1 month as well.  1 day she will get constipated the next day she will have watery stools.  Has not had a stool today.  She wonders if she has C. difficile.  She denies abdominal pain or fever.  No urinary symptoms.  Then tonight she developed substernal chest pressure.  It radiated to her left jaw and left arm with a tingling numbness sensation.  Chest pressure started while she was at rest watching TV.  There was some pleuritic nature to the pain and she felt short of breath during that episode as well.  It has mostly resolved now.   She also wonders about a number of other issues for example side effects of medications, thyroid  levels, vitamin levels.  She has felt chronically fatigued over many months.  Independent Historian contributed to assessment above: Husband corroborates information above  External Medical Documents Reviewed: Recent hospital stays including multiple emergency department visits over the last few of weeks.  Chief complaints at those time include upset stomach, diarrhea, vomiting, left arm discomfort and workup has included CT scan which showed mild uncomplicated diverticulitis, negative DVT in the left upper arm.      Physical Exam   Triage Vital Signs: ED Triage Vitals  Encounter Vitals Group     BP 09/30/23 2242 122/85     Girls Systolic BP Percentile --      Girls Diastolic BP Percentile --      Boys Systolic BP  Percentile --      Boys Diastolic BP Percentile --      Pulse Rate 09/30/23 2242 92     Resp 09/30/23 2242 18     Temp --      Temp Source 09/30/23 2242 Oral     SpO2 09/30/23 2242 94 %     Weight 09/30/23 2243 248 lb (112.5 kg)     Height 09/30/23 2243 5' 5 (1.651 m)     Head Circumference --      Peak Flow --      Pain Score 09/30/23 2243 5     Pain Loc --      Pain Education --      Exclude from Growth Chart --     Most recent vital signs: Vitals:   10/01/23 0100 10/01/23 0130  BP: 100/85 (!) 106/92  Pulse: (!) 109 77  Resp: (!) 25 18  SpO2: 91% 97%    General: Awake, no distress.  CV:  Good peripheral perfusion.  Resp:  Normal effort.  Abd:  No distention.  Other:  Normal vital signs, no fever, speaking full sentences no respiratory distress.  Clear lungs without focality or wheezing.  Heart sounds normal rate and rhythm without murmur.  Soft benign abdominal exam deep palpation all quadrants, nonfocal nonperitoneal.   ED Results / Procedures / Treatments   Labs (all labs ordered are listed, but only abnormal results are  displayed) Labs Reviewed  BASIC METABOLIC PANEL WITH GFR - Abnormal; Notable for the following components:      Result Value   CO2 21 (*)    Glucose, Bld 146 (*)    Anion gap 16 (*)    All other components within normal limits  RESP PANEL BY RT-PCR (RSV, FLU A&B, COVID)  RVPGX2  CBC  TSH  T4, FREE  D-DIMER, QUANTITATIVE  HEPATIC FUNCTION PANEL  TROPONIN I (HIGH SENSITIVITY)  TROPONIN I (HIGH SENSITIVITY)     I ordered and reviewed the above labs they are notable for cell counts and electrolytes unremarkable.  Initial troponin is 4  EKG  ED ECG REPORT I, Ginnie Shams, the attending physician, personally viewed and interpreted this ECG.   Date: 09/30/2023  EKG Time: 2242  Rate: 89  Rhythm: sinus  Axis: nl  Intervals:nl  ST&T Change: no stemi    RADIOLOGY I independently reviewed and interpreted chest x-ray and see no obvious  focality pneumothorax I also reviewed radiologist's formal read.   PROCEDURES:  Critical Care performed: No  Procedures   MEDICATIONS ORDERED IN ED: Medications - No data to display  IMPRESSION / MDM / ASSESSMENT AND PLAN / ED COURSE  I reviewed the triage vital signs and the nursing notes.                                Patient's presentation is most consistent with acute presentation with potential threat to life or bodily function.  Differential diagnosis includes, but is not limited to, ACS, PE, dissection, respiratory infection, pneumothorax, arrhythmia, electrolyte derangement, thyroid  emergency, diarrheal illness   The patient is on the cardiac monitor to evaluate for evidence of arrhythmia and/or significant heart rate changes.  MDM:    Chief complaint tonight is chest pressure, pleuritic pain, shortness of breath.  Mostly resolved now.  Given age and comorbidities concern for ACS will check serial troponins as well as EKG.  Pleurisy, considered PE, low to moderate pretest probability is will start with D-dimer.  Check chest x-ray given her reports of a mild cough as well, consider bacterial pneumonia.  Check viral swab.  Subacute/chronic complaints in the background including chronic diarrhea, as well as fatigue.  She has not had a bowel movement all day.  I think less likely C. difficile but we will go ahead and check a stool panel as well as a C. difficile if she is able to provide a sample today.  Benign abdominal exam rules against surgical abdominal pathologies at this time, defer advanced imaging.  Check thyroid  TSH/T4  Workup as above has been unremarkable including stable flat troponins, negative D-dimer, normal thyroid  testing.  I considered hospitalization/admission however given the stability in the emergency department and unremarkable workup I doubt cardiopulmonary emergencies and plan will be for discharge and follow-up with cardiology/PMD.      FINAL  CLINICAL IMPRESSION(S) / ED DIAGNOSES   Final diagnoses:  Nonspecific chest pain  Other fatigue  Diarrhea, unspecified type  Shortness of breath     Rx / DC Orders   ED Discharge Orders          Ordered    Ambulatory referral to Cardiology       Comments: If you have not heard from the Cardiology office within the next 72 hours please call [phone].   10/01/23 9973  Note:  This document was prepared using Dragon voice recognition software and may include unintentional dictation errors.    Cyrena Mylar, MD 10/01/23 [phone]

## 2023-09-30 NOTE — ED Triage Notes (Signed)
 Pt in via POV, reports worsening shortness of breath and centralized chest pressure x approximately 1 month.  States she just finished wearing holter monitor x 1 week for same but has not received results of that yet.  Patient dyspneic at rest, but able to speak in full clear sentences.  Vitals WDL at this time.

## 2023-10-01 LAB — D-DIMER, QUANTITATIVE: D-Dimer, Quant: 0.42 ug{FEU}/mL (ref 0.00–0.50)

## 2023-10-01 LAB — HEPATIC FUNCTION PANEL
ALT: 21 U/L (ref 0–44)
AST: 25 U/L (ref 15–41)
Albumin: 3.6 g/dL (ref 3.5–5.0)
Alkaline Phosphatase: 46 U/L (ref 38–126)
Bilirubin, Direct: <0.1 mg/dL (ref 0.0–0.2)
Total Bilirubin: 0.6 mg/dL (ref 0.0–1.2)
Total Protein: 7 g/dL (ref 6.5–8.1)

## 2023-10-01 LAB — TSH: TSH: 2.685 u[IU]/mL (ref 0.350–4.500)

## 2023-10-01 LAB — T4, FREE: Free T4: 0.69 ng/dL (ref 0.61–1.12)

## 2023-10-01 LAB — RESP PANEL BY RT-PCR (RSV, FLU A&B, COVID)  RVPGX2
Influenza A by PCR: NEGATIVE
Influenza B by PCR: NEGATIVE
Resp Syncytial Virus by PCR: NEGATIVE
SARS Coronavirus 2 by RT PCR: NEGATIVE

## 2023-10-01 LAB — TROPONIN I (HIGH SENSITIVITY): Troponin I (High Sensitivity): 5 ng/L (ref <18)

## 2023-10-01 NOTE — Discharge Instructions (Addendum)
 Fortunately your evaluation Emergency Department did not show any life-threatening emergencies that account for her symptoms, we saw no signs of heart attack or blood clot or lung infection.  Your thyroid  testing was normal.  As we spoke of, it is important that you follow-up with your primary doctor and cardiologist to do more testing as needed to further evaluate your symptoms.  I made a referral to cardiology who will contact you for an appointment.  Thank you for choosing us  for your health care today!  Please see your primary doctor this week for a follow up appointment.   If you have any new, worsening, or unexpected symptoms call your doctor right away or come back to the emergency department for reevaluation.  It was my pleasure to care for you today.   Ginnie EDISON Cyrena, MD

## 2023-10-30 NOTE — Progress Notes (Deleted)
 Cardiology Office Note  Date:  10/30/2023   ID:  [name], DOB [birth year], MRN [id number]  PCP:  Valora Lynwood FALCON, MD   No chief complaint on file.   HPI:  [name] is a 68 y.o. female with past medical history of: Past Medical History:  Diagnosis Date   Anxiety    Arthritis    Atopic dermatitis    scalp   COVID    2020 and 2021   Depression    Hyperlipidemia    Hypertension    pt denies   Thyroid  disease    Tobacco abuse   Who presents by referral from Dr. Ginnie Frye for nonspecific chest pain  Seen in the ER September 30, 2023 for 1 month of chest pain and shortness of breath palpitations Wore a Holter through primary care On and off diarrhea 1 month  Substernal chest pain radiating to left jaw, left arm with tingling numbness sensation Symptoms started while at rest watching TV Some pleuritic nature to the pain and felt short of breath during the episode Symptoms resolved in the ER Chronic fatigue over several months  ER workup negative     PMH:   has a past medical history of Anxiety, Arthritis, Atopic dermatitis, COVID, Depression, Hyperlipidemia, Hypertension, Thyroid  disease, and Tobacco abuse.   PSH:    Past Surgical History:  Procedure Laterality Date   LUMBAR DISC SURGERY     ROTATOR CUFF REPAIR Bilateral     Current Outpatient Medications  Medication Sig Dispense Refill   albuterol  (VENTOLIN  HFA) 108 (90 Base) MCG/ACT inhaler Inhale 2 puffs into the lungs every 6 (six) hours as needed for wheezing or shortness of breath. 1 each 2   amphetamine-dextroamphetamine (ADDERALL) 20 MG tablet Take 20 mg by mouth daily.     clonazePAM  (KLONOPIN ) 0.5 MG tablet TAKE 1/2 OF A TABLET (0.25 MG TOTAL) BY MOUTH TWICE A DAY 30 tablet 2   desonide (DESOWEN) 0.05 % cream Apply 1 Application topically 2 (two) times daily.     DULoxetine (CYMBALTA) 60 MG capsule Take 60 mg by mouth daily.      gabapentin  (NEURONTIN ) 600 MG tablet Take 600 mg by mouth 2 (two)  times daily.     hydrOXYzine  (ATARAX ) 25 MG tablet Take 1 tablet (25 mg total) by mouth every 6 (six) hours. 12 tablet 0   levothyroxine  (SYNTHROID ) 75 MCG tablet Take 75 mcg by mouth daily.     liothyronine (CYTOMEL) 25 MCG tablet Take 25 mcg by mouth daily. Takes 1/2 tablet BID     melatonin 5 MG TABS Take 5 mg by mouth at bedtime.     metFORMIN  (GLUCOPHAGE -XR) 500 MG 24 hr tablet TAKE 1 TABLET BY MOUTH EVERY DAY WITH BREAKFAST 90 tablet 1   metoprolol  succinate (TOPROL -XL) 25 MG 24 hr tablet TAKE 1 TABLET BY MOUTH  DAILY 90 tablet 3   mirabegron ER (MYRBETRIQ) 50 MG TB24 tablet Take 50 mg by mouth daily.     mupirocin  ointment (BACTROBAN ) 2 % Apply 1 Application topically 2 (two) times daily. To eyebrows 22 g 0   ondansetron  (ZOFRAN ) 4 MG tablet Take 1 tablet (4 mg total) by mouth every 8 (eight) hours as needed for nausea or vomiting. 40 tablet 0   ondansetron  (ZOFRAN -ODT) 4 MG disintegrating tablet Take 1 tablet (4 mg total) by mouth every 8 (eight) hours as needed. 12 tablet 0   QUEtiapine (SEROQUEL) 300 MG tablet Take 300 mg by mouth at bedtime.  ropinirole (REQUIP) 5 MG tablet Take 5 mg by mouth as needed.      tizanidine  (ZANAFLEX ) 6 MG capsule TAKE 1 CAPSULE BY MOUTH THREE TIMES A DAY AS NEEDED FOR MUSCLE SPASMS 60 capsule 2   No current facility-administered medications for this visit.     Allergies:   Ancef [cefazolin sodium], Penicillins, Clarithromycin, Doxycycline , Sulfamethoxazole -trimethoprim, and Other   Social History:  The patient  reports that she quit smoking about 7 years ago. Her smoking use included cigarettes. She has never used smokeless tobacco. She reports current alcohol use. She reports that she does not use drugs.   Family History:   family history includes Arthritis in her mother; Heart disease in her father; Macular degeneration in her mother; Neuropathy in her mother.    Review of Systems: ROS   PHYSICAL EXAM: VS:  There were no vitals taken for  this visit. , BMI There is no height or weight on file to calculate BMI. GEN: Well nourished, well developed, in no acute distress HEENT: normal Neck: no JVD, carotid bruits, or masses Cardiac: RRR; no murmurs, rubs, or gallops,no edema  Respiratory:  clear to auscultation bilaterally, normal work of breathing GI: soft, nontender, nondistended, + BS MS: no deformity or atrophy Skin: warm and dry, no rash Neuro:  Strength and sensation are intact Psych: euthymic mood, full affect    Recent Labs: 09/30/2023: ALT 21; BUN 12; Creatinine, Ser 0.73; Hemoglobin 13.2; Platelets 359; Potassium 4.0; Sodium 140; TSH 2.685    Lipid Panel Lab Results  Component Value Date   CHOL 203 (H) 09/07/2021   HDL 70.70 09/07/2021   LDLCALC 112 (H) 09/07/2021   TRIG 101.0 09/07/2021      Wt Readings from Last 3 Encounters:  09/30/23 248 lb (112.5 kg)  09/16/23 253 lb 8.5 oz (115 kg)  09/12/23 253 lb (114.8 kg)       ASSESSMENT AND PLAN:  Problem List Items Addressed This Visit   None    Disposition:   F/U  12 months   Total encounter time more than 30 minutes  Greater than 50% was spent in counseling and coordination of care with the patient    Signed, Velinda Lunger, M.D., Ph.D. [facility] [place], [place] [phone]

## 2023-10-31 ENCOUNTER — Ambulatory Visit: Admitting: Cardiovascular Disease

## 2023-10-31 DIAGNOSIS — I700 Atherosclerosis of aorta: Secondary | ICD-10-CM

## 2023-10-31 DIAGNOSIS — I471 Supraventricular tachycardia, unspecified: Secondary | ICD-10-CM

## 2023-10-31 DIAGNOSIS — E1169 Type 2 diabetes mellitus with other specified complication: Secondary | ICD-10-CM

## 2023-10-31 DIAGNOSIS — E7800 Pure hypercholesterolemia, unspecified: Secondary | ICD-10-CM

## 2023-10-31 DIAGNOSIS — R079 Chest pain, unspecified: Secondary | ICD-10-CM

## 2023-10-31 DIAGNOSIS — R0602 Shortness of breath: Secondary | ICD-10-CM

## 2023-11-09 ENCOUNTER — Ambulatory Visit

## 2023-11-17 ENCOUNTER — Ambulatory Visit: Attending: Cardiovascular Disease | Admitting: Cardiovascular Disease

## 2023-11-17 ENCOUNTER — Encounter: Payer: Self-pay | Admitting: Cardiovascular Disease

## 2023-11-17 VITALS — BP 100/72 | HR 91 | Ht 65.0 in | Wt 253.0 lb

## 2023-11-17 DIAGNOSIS — R0600 Dyspnea, unspecified: Secondary | ICD-10-CM

## 2023-11-17 DIAGNOSIS — R002 Palpitations: Secondary | ICD-10-CM

## 2023-11-17 DIAGNOSIS — I471 Supraventricular tachycardia, unspecified: Secondary | ICD-10-CM

## 2023-11-17 DIAGNOSIS — E1169 Type 2 diabetes mellitus with other specified complication: Secondary | ICD-10-CM | POA: Diagnosis not present

## 2023-11-17 DIAGNOSIS — I700 Atherosclerosis of aorta: Secondary | ICD-10-CM | POA: Diagnosis not present

## 2023-11-17 DIAGNOSIS — R079 Chest pain, unspecified: Secondary | ICD-10-CM | POA: Insufficient documentation

## 2023-11-17 NOTE — Progress Notes (Signed)
 Cardiology Office Note  Date:  11/17/2023   ID:  [name], DOB 1955/03/17, MRN [id number]  PCP:  Valora Lynwood FALCON, MD   Chief Complaint  Patient presents with   New Patient (Initial Visit)    Ref by Dr. Valora for SVT, palpitations and chest pain. Patient c/o shortness of breath and racing irregular heartbeats.     HPI:  [name] is a 68 y.o. female with past medical history of: Past Medical History:  Diagnosis Date   Anxiety    Arthritis    Atopic dermatitis    scalp   COVID    2020 and 2021   Depression    Hyperlipidemia    Hypertension    pt denies   Thyroid  disease    Tobacco abuse   Former smoker Who presents by referral from Dr. Ginnie Campi for nonspecific chest pain  Previously seen by cardiology for palpitations 2019 Stress test was ordered at that time but she did not completed Monitor in 9/25 was told no significant arrhythmia  August and sept 2025: yellow foul-smelling mucus appearing diarrhea, headache, intermittent stiff neck, low-grade fevers, cough, shortness of breath, and simply not feeling well. completing course of Flagyl  for her diverticulitis symptoms had improved but over the past 5 days have returned and are worsening.  Per chart review patient was seen by primary care on September 21, 2023 and did report 3 weeks of nausea, vomiting, and diarrhea   Seen in the ER September 30, 2023 for 1 month of chest pain and shortness of breath palpitations Wore a Holter through primary care On and off diarrhea 1 month  Substernal chest pain radiating to left jaw, left arm with tingling numbness sensation Symptoms started while at rest watching TV Some pleuritic nature to the pain and felt short of breath during the episode Symptoms resolved in the ER Chronic fatigue over several months  ER workup negative  Now with SOB on exertion, rare palpitations (forever)  EKG personally reviewed by myself on todays visit EKG  Interpretation Date/Time:  Friday November 17 2023 09:02:13 EST Ventricular Rate:  91 PR Interval:  148 QRS Duration:  72 QT Interval:  368 QTC Calculation: 452 R Axis:   -24  Text Interpretation: Sinus rhythm with Premature atrial complexes and Premature ventricular complexes or Fusion complexes When compared with ECG of 30-Sep-2023 22:42, Premature ventricular complexes are now Present Premature atrial complexes are now Present Confirmed by Perla Lye [phone]) on 11/17/2023 9:10:41 AM    PMH:   has a past medical history of Anxiety, Arthritis, Atopic dermatitis, COVID, Depression, Hyperlipidemia, Hypertension, Thyroid  disease, and Tobacco abuse.   PSH:    Past Surgical History:  Procedure Laterality Date   LUMBAR DISC SURGERY     ROTATOR CUFF REPAIR Bilateral     Current Outpatient Medications  Medication Sig Dispense Refill   albuterol  (VENTOLIN  HFA) 108 (90 Base) MCG/ACT inhaler Inhale 2 puffs into the lungs every 6 (six) hours as needed for wheezing or shortness of breath. 1 each 2   amphetamine-dextroamphetamine (ADDERALL) 20 MG tablet Take 20 mg by mouth daily.     clonazePAM  (KLONOPIN ) 0.5 MG tablet TAKE 1/2 OF A TABLET (0.25 MG TOTAL) BY MOUTH TWICE A DAY 30 tablet 2   desonide (DESOWEN) 0.05 % cream Apply 1 Application topically 2 (two) times daily.     DULoxetine (CYMBALTA) 60 MG capsule Take 60 mg by mouth daily.      hydrOXYzine  (ATARAX ) 25 MG tablet Take 1  tablet (25 mg total) by mouth every 6 (six) hours. 12 tablet 0   levothyroxine  (SYNTHROID ) 75 MCG tablet Take 75 mcg by mouth daily.     liothyronine (CYTOMEL) 25 MCG tablet Take 25 mcg by mouth daily. Takes 1/2 tablet BID     melatonin 5 MG TABS Take 5 mg by mouth at bedtime.     metFORMIN  (GLUCOPHAGE -XR) 500 MG 24 hr tablet TAKE 1 TABLET BY MOUTH EVERY DAY WITH BREAKFAST 90 tablet 1   metoprolol  succinate (TOPROL -XL) 25 MG 24 hr tablet TAKE 1 TABLET BY MOUTH  DAILY 90 tablet 3   mirabegron ER (MYRBETRIQ) 50 MG  TB24 tablet Take 50 mg by mouth daily.     mupirocin  ointment (BACTROBAN ) 2 % Apply 1 Application topically 2 (two) times daily. To eyebrows 22 g 0   ondansetron  (ZOFRAN -ODT) 4 MG disintegrating tablet Take 1 tablet (4 mg total) by mouth every 8 (eight) hours as needed. 12 tablet 0   QUEtiapine (SEROQUEL) 300 MG tablet Take 300 mg by mouth at bedtime.     ropinirole (REQUIP) 5 MG tablet Take 5 mg by mouth as needed.      tizanidine  (ZANAFLEX ) 6 MG capsule TAKE 1 CAPSULE BY MOUTH THREE TIMES A DAY AS NEEDED FOR MUSCLE SPASMS 60 capsule 2   diphenhydrAMINE (BENADRYL) 25 mg capsule Take 25 mg by mouth every 6 (six) hours as needed.     gabapentin  (NEURONTIN ) 600 MG tablet Take 600 mg by mouth 2 (two) times daily. (Patient not taking: Reported on 11/17/2023)     ondansetron  (ZOFRAN ) 4 MG tablet Take 1 tablet (4 mg total) by mouth every 8 (eight) hours as needed for nausea or vomiting. (Patient not taking: Reported on 11/17/2023) 40 tablet 0   No current facility-administered medications for this visit.     Allergies:   Ancef [cefazolin sodium], Penicillins, Clarithromycin, Doxycycline , Egg yolk, Other, and Sulfamethoxazole -trimethoprim   Social History:  The patient  reports that she quit smoking about 7 years ago. Her smoking use included cigarettes. She has never used smokeless tobacco. She reports current alcohol use. She reports that she does not use drugs.   Family History:   family history includes Arthritis in her mother; Heart disease in her brother and father; Hyperlipidemia in her brother; Macular degeneration in her mother; Neuropathy in her mother.    Review of Systems: Review of Systems  Constitutional: Negative.   HENT: Negative.    Respiratory:  Positive for shortness of breath.   Cardiovascular:  Positive for palpitations.  Gastrointestinal: Negative.   Musculoskeletal: Negative.   Neurological: Negative.   Psychiatric/Behavioral: Negative.    All other systems reviewed and  are negative.   PHYSICAL EXAM: VS:  BP 100/70 (BP Location: Left Arm, Patient Position: Sitting, Cuff Size: Large)   Ht 5' 5 (1.651 m)   Wt 253 lb (114.8 kg)   SpO2 100%   BMI 42.10 kg/m  , BMI Body mass index is 42.1 kg/m. GEN: Well nourished, well developed, in no acute distress HEENT: normal Neck: no JVD, carotid bruits, or masses Cardiac: RRR; no murmurs, rubs, or gallops,no edema  Respiratory:  clear to auscultation bilaterally, normal work of breathing GI: soft, nontender, nondistended, + BS MS: no deformity or atrophy Skin: warm and dry, no rash Neuro:  Strength and sensation are intact Psych: euthymic mood, full affect   Recent Labs: 09/30/2023: ALT 21; BUN 12; Creatinine, Ser 0.73; Hemoglobin 13.2; Platelets 359; Potassium 4.0; Sodium 140; TSH 2.685  Lipid Panel Lab Results  Component Value Date   CHOL 203 (H) 09/07/2021   HDL 70.70 09/07/2021   LDLCALC 112 (H) 09/07/2021   TRIG 101.0 09/07/2021      Wt Readings from Last 3 Encounters:  11/17/23 253 lb (114.8 kg)  09/30/23 248 lb (112.5 kg)  09/16/23 253 lb 8.5 oz (115 kg)     ASSESSMENT AND PLAN:  Problem List Items Addressed This Visit       Cardiology Problems   Thoracic aortic atherosclerosis   Relevant Orders   EKG 12-Lead   PSVT (paroxysmal supraventricular tachycardia) - Primary   Relevant Orders   EKG 12-Lead     Other   Chest pain of uncertain etiology   Type 2 diabetes mellitus with other specified complication (HCC)    Palpitations Holter monitor from primary care has been requested She was told there was no significant arrhythmia, was not aware she was supposed to hit the button when she had symptoms -On EKG today with PACs and PVCs - Symptoms have much improved after her abdominal symptoms/diarrhea have resolved - Echocardiogram ordered to rule out structural heart disease - Continue metoprolol  succinate 25 daily - If symptoms get worse could repeat 2-week Zio monitor to our  system  Shortness of breath Likely multifactorial including conditioning, obesity Echo ordered to rule out structural heart disease CT scan chest and abdomen pelvis reviewed showing no significant coronary calcification Likely low risk for underlying ischemia Minimal aortic atherosclerosis in the arch and descending aorta  Hyperlipidemia Lifestyle modification recommended, CT scan images reviewed with her in detail No strong indication for statin given low burden of atherosclerotic plaque A1c well-controlled Weight loss and walking program recommended  Diarrhea Symptoms resolved, may have had diverticulitis, etiology unclear, was treated with Flagyl  but symptoms persisted for several weeks Reports that she needs  to have her colonoscopy   Signed, Velinda Lunger, M.D., Ph.D. [facility] [place], [place] [phone]

## 2023-11-17 NOTE — Patient Instructions (Addendum)
 Medication Instructions: Your physician recommends that you continue on your current medications as directed. Please refer to the Current Medication list given to you today.    If you need a refill on your cardiac medications before your next appointment, please call your pharmacy.   Lab work: No labs ordered today    Testing/Procedures: Your physician has requested that you have an echocardiogram. Echocardiography is a painless test that uses sound waves to create images of your heart. It provides your doctor with information about the size and shape of your heart and how well your heart's chambers and valves are working.   You may receive an ultrasound enhancing agent through an IV if needed to better visualize your heart during the echo. This procedure takes approximately one hour.  There are no restrictions for this procedure.  This will take place at [address] [facility] [address], [place] [address]  Please note: We ask at that you not bring children with you during ultrasound (echo/ vascular) testing. Due to room size and safety concerns, children are not allowed in the ultrasound rooms during exams. Our front office staff cannot provide observation of children in our lobby area while testing is being conducted. An adult accompanying a patient to their appointment will only be allowed in the ultrasound room at the discretion of the ultrasound technician under special circumstances. We apologize for any inconvenience.   Echo for mitral valve disease, palpitations  Follow-Up: At [facility], you and your health needs are our priority.  As part of our continuing mission to provide you with exceptional heart care, we have created designated Provider Care Teams.  These Care Teams include your primary Cardiologist (physician) and Advanced Practice Providers (APPs -  Physician Assistants and Nurse Practitioners) who all work together to provide you with the care you need, when  you need it.  You will need a follow up appointment as needed  Providers on your designated Care Team:   Lonni Meager, NP Bernardino Bring, PA-C [place], [place]  COVID-19 Vaccine Information can be found at: [email or web address] For questions related to vaccine distribution or appointments, please email [email or web address] or call [phone].

## 2023-11-20 ENCOUNTER — Ambulatory Visit

## 2023-11-21 NOTE — Telephone Encounter (Signed)
 open in error

## 2023-11-25 ENCOUNTER — Ambulatory Visit
Admission: RE | Admit: 2023-11-25 | Discharge: 2023-11-25 | Disposition: A | Discharge status: Home / Self Care | Source: Ambulatory Visit | Attending: Emergency Medicine | Admitting: Emergency Medicine

## 2023-11-25 VITALS — BP 139/83 | HR 90 | Temp 98.0°F | Resp 18

## 2023-11-25 DIAGNOSIS — H01133 Eczematous dermatitis of right eye, unspecified eyelid: Secondary | ICD-10-CM

## 2023-11-25 DIAGNOSIS — H01136 Eczematous dermatitis of left eye, unspecified eyelid: Secondary | ICD-10-CM | POA: Diagnosis not present

## 2023-11-25 MED ORDER — ERYTHROMYCIN 5 MG/GM OP OINT: TOPICAL_OINTMENT | OPHTHALMIC | 0 refills | Status: AC

## 2023-11-25 NOTE — ED Provider Notes (Signed)
 CAY RALPH PELT    CSN: [id number] Arrival date & time: 11/25/23  1256      History   Chief Complaint Chief Complaint  Patient presents with   Eye Problem    Eye pain, itching, swelling - Entered by patient    HPI [name] is a 68 y.o. female.   Patient presents for evaluation of erythema, pain and swelling and pruritus around the bilateral eyes beginning 7 days ago.  Feels as if her skin is dry.  Symptoms worse in the morning.  Had blurred vision 1 day ago which has resolved.  Denies injury or trauma.  History of dry eyes.  Has attempted use of over-the-counter eyedrops.  Endorses that this has occurred before and improved with use of antibiotics.  Denies presence of drainage.   Past Medical History:  Diagnosis Date   Anxiety    Arthritis    Atopic dermatitis    scalp   COVID    2020 and 2021   Depression    Hyperlipidemia    Hypertension    pt denies   Thyroid  disease    Tobacco abuse     Patient Active Problem List   Diagnosis Date Noted   Chest pain of uncertain etiology 11/17/2023   Thoracic aortic atherosclerosis 11/06/2021   Allergic contact dermatitis of eyelids of both eyes 10/22/2021   Lung infiltrate 09/28/2021   Heliotrope rash of eyelid (HCC) 09/27/2021   Facial rash 09/07/2021   COVID-19 long hauler manifesting chronic fatigue 07/04/2020   Elevated liver enzymes 04/01/2020   Suspected COVID-19 virus infection 05/28/2018   PSVT (paroxysmal supraventricular tachycardia) 05/11/2017   OSA (obstructive sleep apnea) 10/17/2016   Fatigue 10/03/2016   Pulmonary nodule less than 6 cm determined by computed tomography of lung 01/13/2016   Overactive bladder 12/15/2015   Type 2 diabetes mellitus with other specified complication (HCC) 10/10/2015   Bipolar I disorder, most recent episode depressed (HCC) 06/04/2014   Hypovitaminosis D 12/12/2013   Encounter for preventive health examination 12/09/2013   Constipation 04/22/2013   Lumbago  08/17/2012   History of acute pancreatitis 07/11/2012   Colon polyp, hyperplastic 04/30/2012   B12 deficiency 04/11/2011   Fatty liver disease, nonalcoholic 04/11/2011   Obesity, Class III, BMI 40-49.9 (morbid obesity) (HCC) 04/11/2011   Tobacco abuse    Hypothyroidism due to acquired atrophy of thyroid     Hyperlipidemia    Arthritis    Atopic dermatitis    Anxiety     Past Surgical History:  Procedure Laterality Date   LUMBAR DISC SURGERY     ROTATOR CUFF REPAIR Bilateral     OB History   No obstetric history on file.      Home Medications    Prior to Admission medications   Medication Sig Start Date End Date Taking? Authorizing Provider  albuterol  (VENTOLIN  HFA) 108 (90 Base) MCG/ACT inhaler Inhale 2 puffs into the lungs every 6 (six) hours as needed for wheezing or shortness of breath. 07/02/20   Tullo, Teresa L, MD  amphetamine-dextroamphetamine (ADDERALL) 20 MG tablet Take 20 mg by mouth daily. 08/25/21   [provider]  clonazePAM  (KLONOPIN ) 0.5 MG tablet TAKE 1/2 OF A TABLET (0.25 MG TOTAL) BY MOUTH TWICE A DAY 11/13/20   Tullo, Teresa L, MD  desonide (DESOWEN) 0.05 % cream Apply 1 Application topically 2 (two) times daily. 10/25/21   [provider]  diphenhydrAMINE (BENADRYL) 25 mg capsule Take 25 mg by mouth every 6 (six) hours as needed.  [provider]  DULoxetine (CYMBALTA) 60 MG capsule Take 60 mg by mouth daily.  07/05/18   [provider]  gabapentin  (NEURONTIN ) 600 MG tablet Take 600 mg by mouth 2 (two) times daily. Patient not taking: Reported on 11/17/2023 08/24/21   [provider]  hydrOXYzine  (ATARAX ) 25 MG tablet Take 1 tablet (25 mg total) by mouth every 6 (six) hours. 01/06/22   Corlis Burnard DEL, NP  levothyroxine  (SYNTHROID ) 75 MCG tablet Take 75 mcg by mouth daily. 11/01/21   [provider]  liothyronine (CYTOMEL) 25 MCG tablet Take 25 mcg by mouth daily. Takes 1/2 tablet BID 09/02/21   [provider]  melatonin 5 MG TABS Take 5 mg by mouth at bedtime.    [provider]  metFORMIN  (GLUCOPHAGE -XR) 500 MG 24 hr tablet TAKE 1 TABLET BY MOUTH EVERY DAY WITH BREAKFAST 08/22/21   Webb, Padonda B, FNP  metoprolol  succinate (TOPROL -XL) 25 MG 24 hr tablet TAKE 1 TABLET BY MOUTH  DAILY 06/22/20   Tullo, Teresa L, MD  mirabegron ER (MYRBETRIQ) 50 MG TB24 tablet Take 50 mg by mouth daily.    [provider]  mupirocin  ointment (BACTROBAN ) 2 % Apply 1 Application topically 2 (two) times daily. To eyebrows 11/05/21   Marylynn Verneita CROME, MD  ondansetron  (ZOFRAN ) 4 MG tablet Take 1 tablet (4 mg total) by mouth every 8 (eight) hours as needed for nausea or vomiting. Patient not taking: Reported on 11/17/2023 03/19/20   McLean-Scocuzza, Randine SAILOR, MD  ondansetron  (ZOFRAN -ODT) 4 MG disintegrating tablet Take 1 tablet (4 mg total) by mouth every 8 (eight) hours as needed. 08/31/23   Jacolyn Pae, MD  QUEtiapine (SEROQUEL) 300 MG tablet Take 300 mg by mouth at bedtime. 06/30/21   [provider]  ropinirole (REQUIP) 5 MG tablet Take 5 mg by mouth as needed.  09/13/18   [provider]  tizanidine  (ZANAFLEX ) 6 MG capsule TAKE 1 CAPSULE BY MOUTH THREE TIMES A DAY AS NEEDED FOR MUSCLE SPASMS 09/02/16   Marylynn Verneita CROME, MD    Family History Family History  Problem Relation Age of Onset   Arthritis Mother    Macular degeneration Mother    Neuropathy Mother    Heart disease Father    Hyperlipidemia Brother    Heart disease Brother    Breast cancer Neg Hx     Social History Social History   Tobacco Use   Smoking status: Former    Current packs/day: 0.00    Types: Cigarettes    Quit date: 01/02/2016    Years since quitting: 7.9   Smokeless tobacco: Never  Vaping Use   Vaping status: Never Used  Substance Use Topics   Alcohol use: Yes    Comment: once a week    Drug use: No     Allergies   Ancef [cefazolin sodium], Penicillins, Clarithromycin,  Doxycycline , Egg yolk, Other, and Sulfamethoxazole -trimethoprim   Review of Systems Review of Systems   Physical Exam Triage Vital Signs ED Triage Vitals  Encounter Vitals Group     BP 11/25/23 1316 139/83     Girls Systolic BP Percentile --      Girls Diastolic BP Percentile --      Boys Systolic BP Percentile --      Boys Diastolic BP Percentile --      Pulse Rate 11/25/23 1316 90     Resp 11/25/23 1316 18     Temp 11/25/23 1316 98 F (36.7 C)  Temp Source 11/25/23 1316 Oral     SpO2 11/25/23 1316 96 %     Weight --      Height --      Head Circumference --      Peak Flow --      Pain Score 11/25/23 1319 0     Pain Loc --      Pain Education --      Exclude from Growth Chart --    No data found.  Updated Vital Signs BP 139/83 (BP Location: Left Arm)   Pulse 90   Temp 98 F (36.7 C) (Oral)   Resp 18   SpO2 96%   Visual Acuity Right Eye Distance: 20/40 Left Eye Distance: 20/50 Bilateral Distance: 20/40  Right Eye Near:   Left Eye Near:    Bilateral Near:     Physical Exam Constitutional:      Appearance: Normal appearance.  Eyes:     Comments: Erythema and mild swelling to the bilateral periorbital region, dry flaking skin noted, no drainage, vision grossly intact, extraocular movements intact  Pulmonary:     Effort: Pulmonary effort is normal.  Neurological:     Mental Status: She is alert and oriented to person, place, and time.      UC Treatments / Results  Labs (all labs ordered are listed, but only abnormal results are displayed) Labs Reviewed - No data to display  EKG   Radiology No results found.  Procedures Procedures (including critical care time)  Medications Ordered in UC Medications - No data to display  Initial Impression / Assessment and Plan / UC Course  I have reviewed the triage vital signs and the nursing notes.  Pertinent labs & imaging results that were available during my care of the patient were reviewed by me  and considered in my medical decision making (see chart for details).  Atopic dermatitis of the right and left eyelid  Presentation most consistent with above diagnoses as there is dry flaking skin with erythema and swelling, no no involvement of the actual eye, no drainage present, per patient endorses improved with use of erythromycin  ointment, as she has had success with this in the past will initiate this treatment with follow-up with her ophthalmologist for further evaluation if symptoms persist Final Clinical Impressions(s) / UC Diagnoses   Final diagnoses:  None   Discharge Instructions   None    ED Prescriptions   None    PDMP not reviewed this encounter.   Teresa Shelba SAUNDERS, [place] 11/26/23 [phone]

## 2023-11-25 NOTE — ED Triage Notes (Signed)
 Patient reports eye pain, itching and swelling x weeks ago and worse in last 3 days. Patient has been using eye drops with no relief. Patient currently denies pain.

## 2023-11-25 NOTE — Discharge Instructions (Signed)
 Today you are evaluated for the redness swelling and itchiness to your eyes which is most consistent with a dermatitis of the eyelid meaning the skin is inflamed  As you have had a cyst with erythromycin  ointment will start treatment with this today May apply medicine over the eyelid every 8 hours for 7 days  May attempt in addition topical hydrocortisone  cream as needed twice daily  May apply cool to warm compresses over the eye for comfort  Avoid eye rubbing as this can cause further irritation  If itching is severe you may take allergy medicine such as Claritin or Zyrtec  If your symptoms continue to persist please follow-up with your primary doctor

## 2023-11-29 ENCOUNTER — Ambulatory Visit

## 2023-12-27 ENCOUNTER — Telehealth: Payer: Self-pay | Admitting: Gastroenterology

## 2023-12-27 NOTE — Telephone Encounter (Signed)
"  ° °  Pre-operative Risk Assessment    Patient Name: [name]  DOB: 1955/03/17 MRN: [id number]   Date of last office visit: unknown Date of next office visit: unknown   Request for Surgical Clearance    Procedure:  colonoscopy  Date of Surgery:  Clearance 01/22/24                                Surgeon:  unknown Surgeon's Group or Practice Name:  [facility] Phone number:  [phone] Fax number:  [phone]   Type of Clearance Requested:   - Medical    Type of Anesthesia:  General    Additional requests/questions:    SignedBerwyn LELON Sprung   12/27/2023, 10:11 AM   "

## 2024-01-09 ENCOUNTER — Ambulatory Visit

## 2024-01-12 NOTE — Telephone Encounter (Signed)
 [name] from [facility] reaching out for update. Please advise.

## 2024-01-12 NOTE — Telephone Encounter (Signed)
 I will update the requesting office the pt has echo upcoming appt. Once the pt has been cleared the provider will fax notes then.

## 2024-01-12 NOTE — Telephone Encounter (Signed)
 GI provider is Dr. Ruel Kung.

## 2024-01-17 NOTE — Telephone Encounter (Signed)
 Will re-fax notes the pt is scheduled for an echo before she can be cleared. Echo is scheduled for 01/25/24.    Daneen Damien BROCKS, NP to Me     01/12/24 10:15 AM Pt is pending echo, scheduled for 01/25/2024. Recommendations to follow pending echo results.

## 2024-01-17 NOTE — Telephone Encounter (Signed)
 [facility] is calling to Follow up with Referral, states the surgery is 01/22/24 and needs the clearance sent over. Stated the ECHO is scheduled after the surgery date. Is requesting a call back.   Fax- [phone]

## 2024-01-24 NOTE — Telephone Encounter (Signed)
 Procedure date now showing in epic as 2/4, will update title.

## 2024-01-25 ENCOUNTER — Ambulatory Visit

## 2024-01-26 NOTE — Telephone Encounter (Signed)
 Routed information regarding echo and that preop recommendations will not be until after it is complete to requesting surgeons office

## 2024-02-07 ENCOUNTER — Ambulatory Visit: Admission: RE | Admit: 2024-02-07 | Source: Home / Self Care | Admitting: Gastroenterology

## 2024-02-07 ENCOUNTER — Encounter: Admission: RE | Payer: Self-pay | Source: Home / Self Care

## 2024-02-26 ENCOUNTER — Ambulatory Visit
